# Patient Record
Sex: Female | Born: 2000 | Race: Black or African American | Hispanic: No | Marital: Single | Smoking: Never smoker
Health system: Southern US, Community
[De-identification: ages and names within clinical notes are randomized; demographics above are authoritative.]

## PROBLEM LIST (undated history)

## (undated) DIAGNOSIS — S83209A Unspecified tear of unspecified meniscus, current injury, unspecified knee, initial encounter: Secondary | ICD-10-CM

## (undated) HISTORY — PX: NO PAST SURGERIES: SHX2092

---
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
## 2021-01-31 ENCOUNTER — Other Ambulatory Visit: Payer: Self-pay

## 2021-01-31 ENCOUNTER — Encounter (HOSPITAL_BASED_OUTPATIENT_CLINIC_OR_DEPARTMENT_OTHER): Payer: Self-pay | Admitting: Orthopaedic Surgery

## 2021-02-03 ENCOUNTER — Other Ambulatory Visit (HOSPITAL_COMMUNITY)
Admission: RE | Admit: 2021-02-03 | Discharge: 2021-02-03 | Disposition: A | Discharge status: Home / Self Care | Payer: PRIVATE HEALTH INSURANCE | Source: Ambulatory Visit | Attending: Orthopaedic Surgery | Admitting: Orthopaedic Surgery

## 2021-02-03 DIAGNOSIS — Z20822 Contact with and (suspected) exposure to covid-19: Secondary | ICD-10-CM | POA: Insufficient documentation

## 2021-02-03 DIAGNOSIS — Z01812 Encounter for preprocedural laboratory examination: Secondary | ICD-10-CM | POA: Diagnosis present

## 2021-02-03 LAB — SARS CORONAVIRUS 2 (TAT 6-24 HRS): SARS Coronavirus 2: NEGATIVE

## 2021-02-04 ENCOUNTER — Other Ambulatory Visit (HOSPITAL_COMMUNITY): Payer: PRIVATE HEALTH INSURANCE

## 2021-02-04 NOTE — H&P (Signed)
PREOPERATIVE H&P  Chief Complaint: LEFT KNEE MEDIAL MENISCUS TEAR  HPI: Cheryl Cole is a 20 y.o. female who is scheduled for, Procedure(s): KNEE ARTHROSCOPY WITH MEDIAL MENISECTOMY; MEDIAL MENISCUS REPAIR KNEE ARTHROSCOPY WITH ANTERIOR CRUCIATE LIGAMENT (ACL) REPAIR.   Patient is a healthy 20 year old who had an injury in January when she was playing volleyball. She felt a pop in her knee. She has had swelling, popping and a feeling of instability. No other areas of injury. She is not playing any sports currently.  Her symptoms are rated as moderate to severe, and have been worsening.  This is significantly impairing activities of daily living.    Please see clinic note for further details on this patient's care.    She has elected for surgical management.   Past Medical History:  Diagnosis Date  . Torn meniscus    Past Surgical History:  Procedure Laterality Date  . NO PAST SURGERIES     Social History   Socioeconomic History  . Marital status: Single    Spouse name: Not on file  . Number of children: Not on file  . Years of education: Not on file  . Highest education level: Not on file  Occupational History  . Not on file  Tobacco Use  . Smoking status: Never Smoker  . Smokeless tobacco: Never Used  Vaping Use  . Vaping Use: Never used  Substance and Sexual Activity  . Alcohol use: Never  . Drug use: Never  . Sexual activity: Not on file  Other Topics Concern  . Not on file  Social History Narrative  . Not on file   Social Determinants of Health   Financial Resource Strain: Not on file  Food Insecurity: Not on file  Transportation Needs: Not on file  Physical Activity: Not on file  Stress: Not on file  Social Connections: Not on file   History reviewed. No pertinent family history. No Known Allergies Prior to Admission medications   Not on File    ROS: All other systems have been reviewed and were otherwise negative with the exception of  those mentioned in the HPI and as above.  Physical Exam: General: Alert, no acute distress Cardiovascular: No pedal edema Respiratory: No cyanosis, no use of accessory musculature GI: No organomegaly, abdomen is soft and non-tender Skin: No lesions in the area of chief complaint Neurologic: Sensation intact distally Psychiatric: Patient is competent for consent with normal mood and affect Lymphatic: No axillary or cervical lymphadenopathy  MUSCULOSKELETAL:  Left knee: Grossly unstable Lachman without endpoint.  Knee range of motion is negative from 15-130 degrees with a slight opening to a varus stress. External rotation appeared to be normal.   Imaging: MRI reviewed which demonstrated a full thickness ACL tear, complex tear of the medial meniscus and lateral meniscus.    Assessment: LEFT KNEE MEDIAL MENISCUS TEAR  Plan: Plan for Procedure(s): KNEE ARTHROSCOPY WITH MEDIAL MENISECTOMY; MEDIAL MENISCUS REPAIR KNEE ARTHROSCOPY WITH ANTERIOR CRUCIATE LIGAMENT (ACL) REPAIR  The risks benefits and alternatives were discussed with the patient including but not limited to the risks of nonoperative treatment, versus surgical intervention including infection, bleeding, nerve injury,  blood clots, cardiopulmonary complications, morbidity, mortality, among others, and they were willing to proceed.   The patient acknowledged the explanation, agreed to proceed with the plan and consent was signed.   Operative Plan: Left knee scope with BTB ACL and medial meniscectomy versus repair Discharge Medications: Standard DVT Prophylaxis: Aspirin Physical Therapy:  Outpatient  Special Discharge needs: Knee immobilizer ([employer] authorization has been submitted for authorization on 3/31)   Vernetta Honey, PA-C  02/04/2021 4:16 PM

## 2021-02-04 NOTE — Anesthesia Preprocedure Evaluation (Addendum)
Anesthesia Evaluation  Patient identified by MRN, date of birth, ID band Patient awake    Reviewed: Allergy & Precautions, NPO status , Patient's Chart, lab work & pertinent test results  Airway Mallampati: I  TM Distance: >3 FB Neck ROM: Full    Dental no notable dental hx.    Pulmonary neg pulmonary ROS,    Pulmonary exam normal breath sounds clear to auscultation       Cardiovascular negative cardio ROS Normal cardiovascular exam Rhythm:Regular Rate:Normal     Neuro/Psych negative neurological ROS  negative psych ROS   GI/Hepatic negative GI ROS, Neg liver ROS,   Endo/Other  negative endocrine ROS  Renal/GU negative Renal ROS     Musculoskeletal negative musculoskeletal ROS (+)   Abdominal   Peds  Hematology negative hematology ROS (+)   Anesthesia Other Findings LEFT KNEE MEDIAL MENISCUS TEAR  Reproductive/Obstetrics hcg negative                            Anesthesia Physical Anesthesia Plan  ASA: II  Anesthesia Plan: General and Regional   Post-op Pain Management: GA combined w/ Regional for post-op pain   Induction: Intravenous  PONV Risk Score and Plan: 3 and Ondansetron, Dexamethasone, Midazolam and Treatment may vary due to age or medical condition  Airway Management Planned: LMA  Additional Equipment:   Intra-op Plan:   Post-operative Plan: Extubation in OR  Informed Consent: I have reviewed the patients History and Physical, chart, labs and discussed the procedure including the risks, benefits and alternatives for the proposed anesthesia with the patient or authorized representative who has indicated his/her understanding and acceptance.     Dental advisory given  Plan Discussed with: CRNA  Anesthesia Plan Comments:        Anesthesia Quick Evaluation

## 2021-02-05 ENCOUNTER — Encounter (HOSPITAL_BASED_OUTPATIENT_CLINIC_OR_DEPARTMENT_OTHER): Payer: Self-pay | Admitting: Orthopaedic Surgery

## 2021-02-05 ENCOUNTER — Encounter (HOSPITAL_BASED_OUTPATIENT_CLINIC_OR_DEPARTMENT_OTHER)
Admission: RE | Disposition: A | Discharge status: Home / Self Care | Payer: Self-pay | Source: Home / Self Care | Attending: Orthopaedic Surgery

## 2021-02-05 ENCOUNTER — Other Ambulatory Visit: Payer: Self-pay

## 2021-02-05 ENCOUNTER — Ambulatory Visit (HOSPITAL_BASED_OUTPATIENT_CLINIC_OR_DEPARTMENT_OTHER): Payer: PRIVATE HEALTH INSURANCE | Admitting: Anesthesiology

## 2021-02-05 ENCOUNTER — Ambulatory Visit (HOSPITAL_BASED_OUTPATIENT_CLINIC_OR_DEPARTMENT_OTHER)
Admission: RE | Admit: 2021-02-05 | Discharge: 2021-02-05 | Disposition: A | Discharge status: Home / Self Care | Payer: PRIVATE HEALTH INSURANCE | Attending: Orthopaedic Surgery | Admitting: Orthopaedic Surgery

## 2021-02-05 DIAGNOSIS — S83242A Other tear of medial meniscus, current injury, left knee, initial encounter: Secondary | ICD-10-CM | POA: Diagnosis not present

## 2021-02-05 DIAGNOSIS — S83512A Sprain of anterior cruciate ligament of left knee, initial encounter: Secondary | ICD-10-CM | POA: Diagnosis not present

## 2021-02-05 DIAGNOSIS — X58XXXA Exposure to other specified factors, initial encounter: Secondary | ICD-10-CM | POA: Diagnosis not present

## 2021-02-05 DIAGNOSIS — S83282A Other tear of lateral meniscus, current injury, left knee, initial encounter: Secondary | ICD-10-CM | POA: Diagnosis not present

## 2021-02-05 HISTORY — DX: Unspecified tear of unspecified meniscus, current injury, unspecified knee, initial encounter: S83.209A

## 2021-02-05 HISTORY — PX: KNEE ARTHROSCOPY WITH LATERAL MENISECTOMY: SHX6193

## 2021-02-05 HISTORY — PX: KNEE ARTHROSCOPY WITH MEDIAL MENISECTOMY: SHX5651

## 2021-02-05 HISTORY — PX: KNEE ARTHROSCOPY WITH ANTERIOR CRUCIATE LIGAMENT (ACL) REPAIR: SHX5644

## 2021-02-05 LAB — POCT PREGNANCY, URINE: Preg Test, Ur: NEGATIVE

## 2021-02-05 SURGERY — ARTHROSCOPY, KNEE, WITH MEDIAL MENISCECTOMY
Anesthesia: Regional | Site: Knee | Laterality: Left

## 2021-02-05 MED ORDER — PHENYLEPHRINE 40 MCG/ML (10ML) SYRINGE FOR IV PUSH (FOR BLOOD PRESSURE SUPPORT)
PREFILLED_SYRINGE | INTRAVENOUS | Status: DC | PRN
  Administered 2021-02-05: 80 ug via INTRAVENOUS

## 2021-02-05 MED ORDER — METHOCARBAMOL 500 MG PO TABS: 500.0000 mg | ORAL_TABLET | Freq: Three times a day (TID) | ORAL | 0 refills | Status: DC | PRN

## 2021-02-05 MED ORDER — CEFAZOLIN SODIUM-DEXTROSE 2-4 GM/100ML-% IV SOLN
INTRAVENOUS | Status: AC
  Filled 2021-02-05: qty 100

## 2021-02-05 MED ORDER — VANCOMYCIN HCL 1000 MG IV SOLR
INTRAVENOUS | Status: DC | PRN
  Administered 2021-02-05: 1000 mg

## 2021-02-05 MED ORDER — KETOROLAC TROMETHAMINE 30 MG/ML IJ SOLN: 30.0000 mg | Freq: Once | INTRAMUSCULAR | Status: DC | PRN

## 2021-02-05 MED ORDER — DEXAMETHASONE SODIUM PHOSPHATE 10 MG/ML IJ SOLN
INTRAMUSCULAR | Status: DC | PRN
  Administered 2021-02-05: 5 mg via INTRAVENOUS

## 2021-02-05 MED ORDER — ROPIVACAINE HCL 5 MG/ML IJ SOLN
INTRAMUSCULAR | Status: DC | PRN
  Administered 2021-02-05: 30 mL via PERINEURAL

## 2021-02-05 MED ORDER — OXYCODONE HCL 5 MG/5ML PO SOLN: 5.0000 mg | Freq: Once | ORAL | Status: AC | PRN

## 2021-02-05 MED ORDER — ACETAMINOPHEN 500 MG PO TABS
1000.0000 mg | ORAL_TABLET | Freq: Once | ORAL | Status: AC
  Administered 2021-02-05: 1000 mg via ORAL

## 2021-02-05 MED ORDER — LACTATED RINGERS IV SOLN: INTRAVENOUS | Status: DC

## 2021-02-05 MED ORDER — ONDANSETRON HCL 4 MG/2ML IJ SOLN
INTRAMUSCULAR | Status: AC
  Filled 2021-02-05: qty 2

## 2021-02-05 MED ORDER — FENTANYL CITRATE (PF) 100 MCG/2ML IJ SOLN
25.0000 ug | INTRAMUSCULAR | Status: DC | PRN
  Administered 2021-02-05: 50 ug via INTRAVENOUS
  Administered 2021-02-05: 25 ug via INTRAVENOUS

## 2021-02-05 MED ORDER — EPINEPHRINE PF 1 MG/ML IJ SOLN
INTRAMUSCULAR | Status: AC
  Filled 2021-02-05: qty 8

## 2021-02-05 MED ORDER — FENTANYL CITRATE (PF) 100 MCG/2ML IJ SOLN
INTRAMUSCULAR | Status: AC
  Filled 2021-02-05: qty 2

## 2021-02-05 MED ORDER — AMISULPRIDE (ANTIEMETIC) 5 MG/2ML IV SOLN
10.0000 mg | Freq: Once | INTRAVENOUS | Status: DC | PRN
Start: 2021-02-05 — End: 2021-02-05

## 2021-02-05 MED ORDER — SODIUM CHLORIDE 0.9 % IR SOLN: Status: DC | PRN

## 2021-02-05 MED ORDER — ASPIRIN 81 MG PO CHEW: 81.0000 mg | CHEWABLE_TABLET | Freq: Two times a day (BID) | ORAL | 0 refills | Status: DC

## 2021-02-05 MED ORDER — ACETAMINOPHEN ER 650 MG PO TBCR: 650.0000 mg | EXTENDED_RELEASE_TABLET | Freq: Three times a day (TID) | ORAL | 0 refills | Status: DC | PRN

## 2021-02-05 MED ORDER — MIDAZOLAM HCL 2 MG/2ML IJ SOLN
2.0000 mg | Freq: Once | INTRAMUSCULAR | Status: AC
  Administered 2021-02-05: 2 mg via INTRAVENOUS

## 2021-02-05 MED ORDER — PROPOFOL 10 MG/ML IV BOLUS
INTRAVENOUS | Status: DC | PRN
  Administered 2021-02-05: 180 mg via INTRAVENOUS

## 2021-02-05 MED ORDER — ONDANSETRON HCL 4 MG PO TABS: 4.0000 mg | ORAL_TABLET | Freq: Three times a day (TID) | ORAL | 0 refills | Status: AC | PRN

## 2021-02-05 MED ORDER — OXYCODONE HCL 5 MG PO TABS: ORAL_TABLET | ORAL | 0 refills | Status: AC

## 2021-02-05 MED ORDER — ACETAMINOPHEN 500 MG PO TABS
ORAL_TABLET | ORAL | Status: AC
  Filled 2021-02-05: qty 2

## 2021-02-05 MED ORDER — IBUPROFEN 600 MG PO TABS: 600.0000 mg | ORAL_TABLET | Freq: Three times a day (TID) | ORAL | 0 refills | Status: DC

## 2021-02-05 MED ORDER — OXYCODONE HCL 5 MG PO TABS
5.0000 mg | ORAL_TABLET | Freq: Once | ORAL | Status: AC | PRN
  Administered 2021-02-05: 5 mg via ORAL

## 2021-02-05 MED ORDER — ONDANSETRON HCL 4 MG/2ML IJ SOLN
INTRAMUSCULAR | Status: DC | PRN
  Administered 2021-02-05: 4 mg via INTRAVENOUS

## 2021-02-05 MED ORDER — VANCOMYCIN HCL 1000 MG IV SOLR
INTRAVENOUS | Status: AC
  Filled 2021-02-05: qty 2000

## 2021-02-05 MED ORDER — OXYCODONE HCL 5 MG PO TABS
ORAL_TABLET | ORAL | Status: AC
  Filled 2021-02-05: qty 1

## 2021-02-05 MED ORDER — PHENYLEPHRINE 40 MCG/ML (10ML) SYRINGE FOR IV PUSH (FOR BLOOD PRESSURE SUPPORT)
PREFILLED_SYRINGE | INTRAVENOUS | Status: AC
  Filled 2021-02-05: qty 10

## 2021-02-05 MED ORDER — LIDOCAINE HCL (CARDIAC) PF 100 MG/5ML IV SOSY
PREFILLED_SYRINGE | INTRAVENOUS | Status: DC | PRN
  Administered 2021-02-05: 60 mg via INTRATRACHEAL

## 2021-02-05 MED ORDER — FENTANYL CITRATE (PF) 100 MCG/2ML IJ SOLN
INTRAMUSCULAR | Status: DC | PRN
  Administered 2021-02-05: 25 ug via INTRAVENOUS
  Administered 2021-02-05: 50 ug via INTRAVENOUS
  Administered 2021-02-05: 25 ug via INTRAVENOUS

## 2021-02-05 MED ORDER — MIDAZOLAM HCL 2 MG/2ML IJ SOLN
INTRAMUSCULAR | Status: AC
  Filled 2021-02-05: qty 2

## 2021-02-05 MED ORDER — PROMETHAZINE HCL 25 MG/ML IJ SOLN: 6.2500 mg | INTRAMUSCULAR | Status: DC | PRN

## 2021-02-05 MED ORDER — PROPOFOL 10 MG/ML IV BOLUS
INTRAVENOUS | Status: AC
  Filled 2021-02-05: qty 20

## 2021-02-05 MED ORDER — CEFAZOLIN SODIUM-DEXTROSE 2-4 GM/100ML-% IV SOLN
2.0000 g | INTRAVENOUS | Status: AC
  Administered 2021-02-05: 2 g via INTRAVENOUS

## 2021-02-05 SURGICAL SUPPLY — 85 items
ANCH SUT 2-0 CVD FBRSTCH PLSTR (Anchor) ×8 IMPLANT
APL PRP STRL LF DISP 70% ISPRP (MISCELLANEOUS) ×2
BANDAGE ESMARK 6X9 LF (GAUZE/BANDAGES/DRESSINGS) IMPLANT
BLADE AVERAGE 25X9 (BLADE) ×3 IMPLANT
BLADE CLIPPER SURG (BLADE) IMPLANT
BLADE SHAVER BONE 5.0X13 (MISCELLANEOUS) ×3 IMPLANT
BLADE SURG 10 STRL SS (BLADE) ×3 IMPLANT
BLADE SURG 15 STRL LF DISP TIS (BLADE) ×2 IMPLANT
BLADE SURG 15 STRL SS (BLADE) ×3
BNDG CMPR 9X6 STRL LF SNTH (GAUZE/BANDAGES/DRESSINGS)
BNDG ELASTIC 6X5.8 VLCR STR LF (GAUZE/BANDAGES/DRESSINGS) ×3 IMPLANT
BNDG ESMARK 6X9 LF (GAUZE/BANDAGES/DRESSINGS)
BONE TUNNEL PLUG CANNULATED (MISCELLANEOUS) ×3 IMPLANT
BURR OVAL 8 FLU 4.0X13 (MISCELLANEOUS) IMPLANT
CHLORAPREP W/TINT 26 (MISCELLANEOUS) ×3 IMPLANT
CLSR STERI-STRIP ANTIMIC 1/2X4 (GAUZE/BANDAGES/DRESSINGS) ×3 IMPLANT
COLLECTOR GRAFT TISSUE (SYSTAGENIX WOUND MANAGEMENT) ×3
COOLER ICEMAN CLASSIC (MISCELLANEOUS) ×3 IMPLANT
COVER BACK TABLE 60X90IN (DRAPES) ×3 IMPLANT
COVER WAND RF STERILE (DRAPES) IMPLANT
CUFF TOURN SGL QUICK 34 (TOURNIQUET CUFF) ×3
CUFF TRNQT CYL 34X4.125X (TOURNIQUET CUFF) ×2 IMPLANT
CUTTER TENSIONER SUT 2-0 0 FBW (INSTRUMENTS) ×3 IMPLANT
DECANTER SPIKE VIAL GLASS SM (MISCELLANEOUS) IMPLANT
DISSECTOR 3.5MM X 13CM CVD (MISCELLANEOUS) IMPLANT
DISSECTOR 4.0MMX13CM CVD (MISCELLANEOUS) ×3 IMPLANT
DRAPE ARTHROSCOPY W/POUCH 90 (DRAPES) ×3 IMPLANT
DRAPE IMP U-DRAPE 54X76 (DRAPES) ×3 IMPLANT
DRAPE TOP ARMCOVERS (MISCELLANEOUS) ×3 IMPLANT
DRAPE U-SHAPE 47X51 STRL (DRAPES) ×3 IMPLANT
ELECT REM PT RETURN 9FT ADLT (ELECTROSURGICAL) ×3
ELECTRODE REM PT RTRN 9FT ADLT (ELECTROSURGICAL) ×2 IMPLANT
GAUZE SPONGE 4X4 12PLY STRL (GAUZE/BANDAGES/DRESSINGS) ×6 IMPLANT
GLOVE SRG 8 PF TXTR STRL LF DI (GLOVE) ×2 IMPLANT
GLOVE SURG ENC MOIS LTX SZ6.5 (GLOVE) ×6 IMPLANT
GLOVE SURG LTX SZ8 (GLOVE) ×6 IMPLANT
GLOVE SURG UNDER POLY LF SZ6.5 (GLOVE) ×3 IMPLANT
GLOVE SURG UNDER POLY LF SZ7 (GLOVE) ×3 IMPLANT
GLOVE SURG UNDER POLY LF SZ8 (GLOVE) ×3
GOWN STRL REUS W/ TWL LRG LVL3 (GOWN DISPOSABLE) ×4 IMPLANT
GOWN STRL REUS W/TWL LRG LVL3 (GOWN DISPOSABLE) ×6
GOWN STRL REUS W/TWL XL LVL3 (GOWN DISPOSABLE) ×3 IMPLANT
GUIDEPIN FLEX PATHFINDER 2.4MM (WIRE) IMPLANT
IMMOBILIZER KNEE 22 UNIV (SOFTGOODS) IMPLANT
IMMOBILIZER KNEE 24 THIGH 36 (MISCELLANEOUS) IMPLANT
IMMOBILIZER KNEE 24 UNIV (MISCELLANEOUS)
IMPL FIBERSTICH 2-0 CVD (Anchor) ×8 IMPLANT
IMPLANT FIBERSTICH 2-0 CVD (Anchor) ×12 IMPLANT
IV NS IRRIG 3000ML ARTHROMATIC (IV SOLUTION) ×12 IMPLANT
KIT TRANSTIBIAL (DISPOSABLE) ×3 IMPLANT
KIT TURNOVER KIT B (KITS) ×3 IMPLANT
KNIFE GRAFT ACL 10MM 5952 (MISCELLANEOUS) ×3 IMPLANT
KNIFE GRAFT ACL 9MM (MISCELLANEOUS) IMPLANT
MANIFOLD NEPTUNE II (INSTRUMENTS) ×3 IMPLANT
NDL SAFETY ECLIPSE 18X1.5 (NEEDLE) ×2 IMPLANT
NEEDLE HYPO 18GX1.5 SHARP (NEEDLE) ×3
NS IRRIG 1000ML POUR BTL (IV SOLUTION) ×3 IMPLANT
PACK ARTHROSCOPY DSU (CUSTOM PROCEDURE TRAY) ×3 IMPLANT
PACK BASIN DAY SURGERY FS (CUSTOM PROCEDURE TRAY) ×3 IMPLANT
PAD COLD SHLDR UNI XL WRAP-ON (PAD) ×3
PAD COLD SHLDR WRAP-ON (PAD) ×3 IMPLANT
PAD COLD UNI XL WRAP-ON (PAD) ×2 IMPLANT
PENCIL SMOKE EVACUATOR (MISCELLANEOUS) IMPLANT
PORT APPOLLO RF 90DEGREE MULTI (SURGICAL WAND) IMPLANT
PORTAL SKID DEVICE (INSTRUMENTS) ×3 IMPLANT
SCREW SHEATHED INTERF 7X25 (Screw) ×3 IMPLANT
SCREW SHEATHED INTERF 9X20MM (Screw) ×3 IMPLANT
SLEEVE SCD COMPRESS KNEE MED (STOCKING) ×3 IMPLANT
SPONGE LAP 4X18 RFD (DISPOSABLE) ×3 IMPLANT
SUT FIBERWIRE #2 38 T-5 BLUE (SUTURE) ×18
SUT MNCRL AB 4-0 PS2 18 (SUTURE) ×3 IMPLANT
SUT VIC AB 0 CT1 27 (SUTURE) ×3
SUT VIC AB 0 CT1 27XBRD ANBCTR (SUTURE) ×2 IMPLANT
SUT VIC AB 3-0 SH 27 (SUTURE) ×3
SUT VIC AB 3-0 SH 27X BRD (SUTURE) ×2 IMPLANT
SUTURE FIBERWR #2 38 T-5 BLUE (SUTURE) ×12 IMPLANT
SYR 5ML LL (SYRINGE) ×3 IMPLANT
SYR 5ML LUER SLIP (SYRINGE) ×3 IMPLANT
TISSUE GRAFT COLLECTOR (SYSTAGENIX WOUND MANAGEMENT) ×2 IMPLANT
TOWEL GREEN STERILE FF (TOWEL DISPOSABLE) ×6 IMPLANT
TUBE CONNECTING 20X1/4 (TUBING) ×3 IMPLANT
TUBE SUCTION HIGH CAP CLEAR NV (SUCTIONS) ×3 IMPLANT
TUBING ARTHROSCOPY IRRIG 16FT (MISCELLANEOUS) ×3 IMPLANT
WATER STERILE IRR 1000ML POUR (IV SOLUTION) ×3 IMPLANT
WRAP KNEE MAXI GEL POST OP (GAUZE/BANDAGES/DRESSINGS) IMPLANT

## 2021-02-05 NOTE — Op Note (Signed)
Orthopaedic Surgery Operative Note (CSN: [id number])  Cheryl Cole  [birth year] Date of Surgery: 02/05/2021   Diagnoses:  Left knee ACL rupture, medial and lateral meniscus tears  Procedure: Left autograft BTB ACL reconstruction Left medial meniscus repair all inside Left partial lateral meniscectomy   Operative Finding Exam under anesthesia: Grossly unstable Lachman with a pivot shift, patient had 15 degrees of hyperextension and full flexion.  No medial or lateral type instability. Suprapatellar pouch: Normal Patellofemoral Compartment: Normal Medial Compartment: Posterior peripheral meniscus tear in the red red zone, repaired with 2 Arthrex fiber stitch anchors Lateral Compartment: Complex posterior lateral meniscus tear nearing the popliteal hiatus which was in the white white zone.  Debrided back with a shaver and a basket, 30% total meniscal volume resected. Intercondylar Notch: Complete ACL tear that appeared relatively chronic in nature.  Empty wall sign noted.  Successful completion of the planned procedure.  Patient's knee was very unstable on her hypermobility worries [place] for retear however we had a robust repair with a BTB autograft.  10 mm graft use.  Her meniscus repair medially was robust as well and has a good chance of healing.  Her lateral meniscus unfortunately was not amenable to repair.  Post-operative plan: The patient will be weightbearing to tolerance in a knee immobilizer transition to a hinged brace.  The patient will be discharged home.  DVT prophylaxis Aspirin 81 mg twice daily for 6 weeks.  Pain control with PRN pain medication preferring oral medicines.  Follow up plan will be scheduled in approximately 7 days for incision check and XR.  Therapy will start as soon as possible though the patient must participate at a [facility] facility due to her insurance.  Post-Op Diagnosis: Same Surgeons:Primary: Bjorn Pippin, MD Assistants:Caroline McBane PA-C Location:  [facility] OR ROOM 6 Anesthesia: General with adductor canal block Antibiotics: Ancef 2 g with local vancomycin powder 1 g at the surgical site Tourniquet time:  Total Tourniquet Time Documented: Thigh (Left) - 68 minutes Total: Thigh (Left) - 68 minutes  Estimated Blood Loss: Minimal Complications: None Specimens: None Implants: Implant Name Type Inv. Item Serial No. Manufacturer Lot No. LRB No. Used Action  IMPLANT FIBERSTICH 2-0 CVD - [id number] Anchor IMPLANT FIBERSTICH 2-0 CVD  [employer] [id number] Left 1 Implanted  IMPLANT FIBERSTICH 2-0 CVD - [id number] Anchor IMPLANT FIBERSTICH 2-0 CVD  [employer] [id number] Left 1 Implanted  SCREW SHEATHED INTERF 9X20MM - [id number] Screw SCREW SHEATHED INTERF 9X20MM  [employer] [id number] Left 1 Implanted  SCREW SHEATHED INTERF 7X25MM - [id number] Screw SCREW SHEATHED INTERF 7X25MM  [employer] [id number] Left 1 Implanted    Indications for Surgery:   Cheryl Cole is a 20 y.o. female with multiple injuries resulting in a left ACL tear and medial lateral meniscus tears.  Benefits and risks of operative and nonoperative management were discussed prior to surgery with patient/guardian(s) and informed consent form was completed.  Specific risks including infection, need for additional surgery, retear, stiffness, meniscus retear and post meniscectomy syndrome amongst others   Procedure:   The patient was identified properly. Informed consent was obtained and the surgical site was marked. The patient was taken up to suite where general anesthesia was induced. The patient was placed in the supine position with a post against the surgical leg and a nonsterile tourniquet applied. The surgical leg was then prepped and draped usual sterile fashion.  A standard surgical timeout was performed.    We began by making an incision  along the medial third of the patellar tendon in line with the tendon itself starting at the level of the distal pole of the patella ending 3 cm distal  to the insertion on the tubercle. We carried the incision down sharply achieving hemostasis 3 progressed identifying the tissue plane of the peritenon. The created skin flaps medially and laterally taking care to avoid damage to the superficial skin. This point the peritenon was incised sharply in line with the tendon and again flaps are created exposing the medial and lateral borders of the tendon. We then took care to ensure that there is appropriate visualization for forearm harvest within our incision using a mobile window technique.  We then used a double bladed scalpel incised the tendon longitudinally 10 mm wide. This incision within the tendon was carried proximal and distally onto the tubercle and proximal wound patella to create a 25 mm bone block from patella and a 27 mm bone block from the tibia.  We made our longitudinal cuts with a saw taking care to not go deeper than 10 mm and rather than make a transverse patella cut we made a bullet style tapered cut at the proximal aspect of the patella to avoid the risk for transverse patella fracture.  The harvest went without issue and graft was taken to the back table.    This point we closed the defect in the patellar tendon after identifying that there was appropriate medial and lateral tendon still intact.   We began arthroscopy and made our lateral and medial portals within our incision on each side of the tendon. Fat pad was resected and diagnostic arthroscopy performed with the findings listed above.   Identified the medial meniscus amenable to repair and repaired with a rasp prior to placing 2 Arthrex fiber stitch anchors.  We placed 2 additional anchors however they did not appropriately fire and the anchors were removed.  Good good robust fixation of our meniscus.  Lateral meniscus was not amenable to repair and was debrided back with a basket and shaver back to stable base.  30% total meniscal volume resected.  The anterior cruciate  ligament stump was debrided utilizing a shaver taking great care to preserve the remnant stump on the femur and the tibia for localization of our tunnels. Once the remnant anterior cruciate ligament was removed and we obtained appropriate visualization by performing a small notchplasty and confirmed that we had indeed identify the over-the-top position. We made small marks at the location of the aperture of the tibial and femoral tunnels and double checked our location prior to drilling.  We began with a femoral tunnel.  We had taken care to make a far medial and low anteromedial portal with spinal needle localization to ensure that we can get appropriate position on the lateral wall of the notch from an anterior medial portal drilling technique.  We used a 7 mm offset guide with the knee at 90 degrees of flexion to mark in the position of the old ACL stump.  We then switched our camera to the medial portal and checked that our position was appropriate compared to the back wall.  At that point we hyperflexed the knee and used the 7 mm offset guide again primarily as a sheath to freehand place our wire at the aforementioned mark taking care to exit anterior to the mid femoral line to avoid posterior wall blowout.  Once the wire was advanced through the skin we clamped it and then placed a  10 mm acorn style reamer by hand ensuring that we did not interfere with the medial femoral condyle.  Once it entered the notch we are able to connect it to a reamer and ream to 35 mm of tunnel depth.  We used an Arthrex GraftNet device to harvest with a shaver any of the bony debris and cleared bony debris from the tunnel to ensure that we had an easy pass.  We again checked from the medial portal to ensure that we only had a 2 mm posterior wall and appropriate position of our tunnel at the 2:30 position.  At this point we advanced our Beath pin and shuttled a #2 FiberWire for eventual graft passage.  We turned our  attention to the tibial tunnel.  We cleared the old ACL stump soft tissue.  We then were able to use a Arthrex tipped aiming tibial guide set to 55 degrees to place our tibial tunnel ensuring that we were centered using the landmark of the posterior aspect of the anterior horn of the lateral meniscus as well as the previous stump.  We took great care to ensure that our wire was positioned appropriately.  We then reamed with the barrel reamer through our ACL harvest incision taking care to protect the skin.  We harvested bone as we reamed and then completed the reaming into the joint.  Any soft tissue was cleared from the apertures of the tunnel.  We finally checked our tunnel position and apertures once more and were happy were these and proceeded with graft shuttling.  The graft was shuttled in typical fashion and we were able to visualize entering the femoral tunnel.  We ensured that the cancellous surface of the graft was anterior moving the collagen of the graft as far posterior as possible.  Before advancing the graft into the femoral socket we placed a guidewire for screw fixation.  The graft was then advanced the appropriate depth and we hyperflexed the knee for placement of our screw.  Femoral fixation was with a 7 x 25 mm metal Arthrex screw. We obtained good purchase with the screw. We verified arthroscopically that there is no sign of graft impingement on the notch. We then cycled the knee multiple times and turned our attention to the tibia.  Tibia was fixed with a 9 x 20 mm metal Arthrex screw and the graft was extremely rotated 90 to anteriorize the tendinous portion within the joint. We achieved good purchase of the graft and there was minimal mismatch.  At this point a gentle Lachman maneuver was performed and there is a stable endpoint and little translation.  Autograft harvested from left over graft prep as well as reamings was used to bone graft the patella as well as the tibial  defects the peritenon was closed.  Incision was closed in multilayer fashion with absorbable suture and Steri-Strips placed. Sterile dressing and knee brace were placed and patient taken to PACU without adverse event.   Alfonse Alpers, PA-C, present and scrubbed throughout the case, critical for completion in a timely fashion, and for retraction, instrumentation, closure.

## 2021-02-05 NOTE — Transfer of Care (Signed)
Immediate Anesthesia Transfer of Care Note  Patient: Cheryl Cole. Se[place]  Procedure(s) Performed: KNEE ARTHROSCOPY WITH  MEDIAL MENISCUS REPAIR (Left Knee) KNEE ARTHROSCOPY WITH ANTERIOR CRUCIATE LIGAMENT (ACL) REPAIR WITH AUTOGRAFT (Left Knee) KNEE ARTHROSCOPY WITH LATERAL MENISECTOMY (Knee)  Patient Location: PACU  Anesthesia Type:General  Level of Consciousness: drowsy, patient cooperative and responds to stimulation  Airway & Oxygen Therapy: Patient Spontanous Breathing and Patient connected to face mask oxygen  Post-op Assessment: Report given to RN and Post -op Vital signs reviewed and stable  Post vital signs: Reviewed and stable  Last Vitals:  Vitals Value Taken Time  BP    Temp    Pulse 78 02/05/21 1151  Resp 17 02/05/21 1151  SpO2 100 % 02/05/21 1151  Vitals shown include unvalidated device data.  Last Pain:  Vitals:   02/05/21 0826  TempSrc: Oral  PainSc: 0-No pain         Complications: No complications documented.

## 2021-02-05 NOTE — Discharge Instructions (Signed)
Ramond Marrow MD, MPH Alfonse Alpers, PA-C [facility] [address], [address] [phone] (tel)   [phone] (fax)   POST-OPERATIVE INSTRUCTIONS - ACL RECONSTRUCTION  WOUND CARE . You may remove the Operative Dressing on Post-Op Day #3 (72hrs after surgery).   . Leave steri strips in place.   . If you feel more comfortable with it you can leave all dressings in place till your 1 week follow-up appointment.   Marland Kitchen KEEP THE INCISIONS CLEAN AND DRY. Marland Kitchen An ACE wrap may be used to control swelling, do not wrap this too tight.  If the initial ACE wrap feels too tight or constricting you may loosen it. . There may be a small amount of fluid/bleeding leaking at the surgical site.  . This is normal; the knee is filled with fluid during the procedure and can leak for 24-48hrs after surgery.  . You may change/reinforce the bandage as needed.  . Use the Cryocuff, GameReady or Ice as often as possible for the first 3-4 days, then as needed for pain relief. Always keep a towel, ACE wrap or other barrier between the cooling unit and your skin.  . You may shower on Post-Op Day #3. Gently pat the area dry. Do not soak the knee in water.  . Do not go swimming in the pool or ocean until 4 weeks after surgery or when otherwise instructed.  BRACE/AMBULATION . Your leg will be placed in a brace post-operatively.  . You will need to wear your brace at all times until we discuss it further. (including when you sleep!) . You will be instructed on further bracing after your first visit. . Use crutches for comfort but you can put your full weight on the leg as tolerated.  Physical therapy: - A referral has been sent to [facility] in [place] - If you have not been called to set up an appointment, please contact their office to get set up - [facility] at [facility] - If you have any issues, please contact our  office to let us know - Therapy will be very important for your recover after surgery    REGIONAL ANESTHESIA (NERVE BLOCKS) - The anesthesia team may have performed a nerve block for you if safe in the setting of your care.  This is a great tool used to minimize pain.  Typically the block may start wearing off overnight.  This can be a challenging period but please utilize your as needed pain medications to try and manage this period and know it will be a brief transition as the nerve block wears completely   POST-OP MEDICATIONS- Multimodal approach to pain control . In general your pain will be controlled with a combination of substances.  Prescriptions unless otherwise discussed are electronically sent to your pharmacy.  This is a carefully made plan we use to minimize narcotic use.     ? Ibuprofen - Anti-inflammatory medication taken on a scheduled basis - Take 1 (600mg ) tablet every 8 hours ? Acetaminophen - Non-narcotic pain medicine taken on a scheduled basis  - Take 1 (650mg ) tablet every 8 hours ? Robaxin - this is a muscle relaxer, take as needed for muscle spasms ? Oxycodone - This is a strong narcotic, to be used only on an "as needed" basis for pain. ? Aspirin 81mg  - This medicine is used to minimize the risk of blood clots after surgery. ?  Zofran -  take as needed for nausea ?   FOLLOW-UP . Please call the office to schedule a follow-up appointment for your incision check if you do not already have one, 7-10 days post-operatively. . IF YOU HAVE ANY QUESTIONS, PLEASE FEEL FREE TO CALL OUR OFFICE.  HELPFUL INFORMATION  . If you had a block, it will wear off between 8-24 hrs postop typically.  This is period when your pain may go from nearly zero to the pain you would have had post-op without the block.  This is an abrupt transition but nothing dangerous is happening.  You may take an extra dose of narcotic when this happens.  Marland Kitchen Keep your leg elevated to decrease swelling, which  will then in turn decrease your pain. I would elevate the foot of your bed by putting a couple of couch pillows between your mattress and box spring. I would not keep pillow directly under your ankle.  . You must wear the brace locked while sleeping and ambulating until follow-up.   . There will be MORE swelling on days 1-3 than there is on the day of surgery.  This also is normal. The swelling will decrease with the anti-inflammatory medication, ice and keeping it elevated. The swelling will make it more difficult to bend your knee. As the swelling goes down your motion will become easier  . You may develop swelling and bruising that extends from your knee down to your calf and perhaps even to your foot over the next week. Do not be alarmed. This too is normal, and it is due to gravity  . There may be some numbness adjacent to the incision site. This may last for 6-12 months or longer in some patients and is expected.  . You may return to sedentary work/school in the next couple of days when you feel up to it. You will need to keep your leg elevated as much as possible   . You should wean off your narcotic medicines as soon as you are able.  Most patients will be off or using minimal narcotics before their first postop appointment.   . We suggest you use the pain medication the first night prior to going to bed, in order to ease any pain when the anesthesia wears off. You should avoid taking pain medications on an empty stomach as it will make you nauseous.  . Do not drink alcoholic beverages or take illicit drugs when taking pain medications.  . It is against the law to drive while taking narcotics. You cannot drive if your Right leg is in brace locked in extension.  . Pain medication may make you constipated.  Below are a few solutions to try in this order: o Decrease the amount of pain medication if you aren't having pain. o Drink lots of decaffeinated fluids. o Drink prune juice and/or  each dried prunes  o If the first 3 don't work start with additional solutions o Take Colace - an over-the-counter stool softener o Take Senokot - an over-the-counter laxative o Take Miralax - a stronger over-the-counter laxative  For more information including helpful videos and documents visit our website:   [email or web address]     Post Anesthesia Home Care Instructions  Activity: Get plenty of rest for the remainder of the day. A responsible individual must stay with you for 24 hours following the procedure.  For the next 24 hours, DO NOT: -Drive a car -Advertising copywriter -Drink alcoholic beverages -Take any medication unless instructed by your physician -  Make any legal decisions or sign important papers.  Meals: Start with liquid foods such as gelatin or soup. Progress to regular foods as tolerated. Avoid greasy, spicy, heavy foods. If nausea and/or vomiting occur, drink only clear liquids until the nausea and/or vomiting subsides. Call your physician if vomiting continues.  Special Instructions/Symptoms: Your throat may feel dry or sore from the anesthesia or the breathing tube placed in your throat during surgery. If this causes discomfort, gargle with warm salt water. The discomfort should disappear within 24 hours.  If you had a scopolamine patch placed behind your ear for the management of post- operative nausea and/or vomiting:  1. The medication in the patch is effective for 72 hours, after which it should be removed.  Wrap patch in a tissue and discard in the trash. Wash hands thoroughly with soap and water. 2. You may remove the patch earlier than 72 hours if you experience unpleasant side effects which may include dry mouth, dizziness or visual disturbances. 3. Avoid touching the patch. Wash your hands with soap and water after contact with the patch.    No Tylenol until 2:30 pm  Regional Anesthesia Blocks  1. Numbness or the  inability to move the "blocked" extremity may last from 3-48 hours after placement. The length of time depends on the medication injected and your individual response to the medication. If the numbness is not going away after 48 hours, call your surgeon.  2. The extremity that is blocked will need to be protected until the numbness is gone and the  Strength has returned. Because you cannot feel it, you will need to take extra care to avoid injury. Because it may be weak, you may have difficulty moving it or using it. You may not know what position it is in without looking at it while the block is in effect.  3. For blocks in the legs and feet, returning to weight bearing and walking needs to be done carefully. You will need to wait until the numbness is entirely gone and the strength has returned. You should be able to move your leg and foot normally before you try and bear weight or walk. You will need someone to be with you when you first try to ensure you do not fall and possibly risk injury.  4. Bruising and tenderness at the needle site are common side effects and will resolve in a few days.  5. Persistent numbness or new problems with movement should be communicated to the surgeon or the [facility] [phone] [facility] [phone]).

## 2021-02-05 NOTE — Anesthesia Procedure Notes (Signed)
Anesthesia Regional Block: Adductor canal block   Pre-Anesthetic Checklist: ,, timeout performed, Correct Patient, Correct Site, Correct Laterality, Correct Procedure,, site marked, risks and benefits discussed, Surgical consent,  Pre-op evaluation,  At surgeon's request and post-op pain management  Laterality: Left  Prep: chloraprep       Needles:  Injection technique: Single-shot  Needle Type: Echogenic Stimulator Needle     Needle Length: 10cm  Needle Gauge: 20     Additional Needles:   Procedures:,,,, ultrasound used (permanent image in chart),,,,  Narrative:  Start time: 02/05/2021 9:00 AM End time: 02/05/2021 9:10 AM Injection made incrementally with aspirations every 5 mL.  Performed by: Personally  Anesthesiologist: Leonides Grills, MD  Additional Notes: Functioning IV was confirmed and monitors were applied. A time-out was performed. Hand hygiene and sterile gloves were used. The thigh was placed in a frog-leg position and prepped in a sterile fashion. A 20ga BBraun echogenic stimulator needle was placed using ultrasound guidance.  Negative aspiration and negative test dose prior to incremental administration of local anesthetic. The patient tolerated the procedure well.

## 2021-02-05 NOTE — Progress Notes (Signed)
Assisted Dr. Ellender with left, ultrasound guided, adductor canal block. Side rails up, monitors on throughout procedure. See vital signs in flow sheet. Tolerated Procedure well.  

## 2021-02-05 NOTE — Anesthesia Procedure Notes (Signed)
Procedure Name: LMA Insertion Date/Time: 02/05/2021 10:23 AM Performed by: Thornell Mule, CRNA Pre-anesthesia Checklist: Emergency Drugs available, Patient identified, Suction available and Patient being monitored Patient Re-evaluated:Patient Re-evaluated prior to induction Oxygen Delivery Method: Circle system utilized Preoxygenation: Pre-oxygenation with 100% oxygen Induction Type: IV induction LMA: LMA inserted LMA Size: 4.0 Number of attempts: 1 Placement Confirmation: positive ETCO2 Tube secured with: Tape Dental Injury: Teeth and Oropharynx as per pre-operative assessment

## 2021-02-05 NOTE — Anesthesia Postprocedure Evaluation (Signed)
Anesthesia Post Note  Patient: Cheryl Cole  Procedure(s) Performed: KNEE ARTHROSCOPY WITH  MEDIAL MENISCUS REPAIR (Left Knee) KNEE ARTHROSCOPY WITH ANTERIOR CRUCIATE LIGAMENT (ACL) REPAIR WITH AUTOGRAFT (Left Knee) KNEE ARTHROSCOPY WITH LATERAL MENISECTOMY (Knee)     Patient location during evaluation: PACU Anesthesia Type: Regional and General Level of consciousness: awake Pain management: pain level controlled Vital Signs Assessment: post-procedure vital signs reviewed and stable Respiratory status: spontaneous breathing, nonlabored ventilation, respiratory function stable and patient connected to nasal cannula oxygen Cardiovascular status: blood pressure returned to baseline and stable Postop Assessment: no apparent nausea or vomiting Anesthetic complications: no   No complications documented.  Last Vitals:  Vitals:   02/05/21 1230 02/05/21 1313  BP: 119/87 137/86  Pulse: 85 79  Resp: 17 16  Temp:  37.3 C  SpO2: 100% 100%    Last Pain:  Vitals:   02/05/21 1313  TempSrc:   PainSc: 5                  Anvika Gashi P Shooter Tangen

## 2021-02-05 NOTE — Interval H&P Note (Signed)
History and Physical Interval Note:  02/05/2021 10:00 AM  Cheryl Cole  has presented today for surgery, with the diagnosis of LEFT KNEE MEDIAL MENISCUS TEAR.  The various methods of treatment have been discussed with the patient and family. After consideration of risks, benefits and other options for treatment, the patient has consented to  Procedure(s): KNEE ARTHROSCOPY WITH MEDIAL MENISECTOMY; MEDIAL MENISCUS REPAIR (Left) KNEE ARTHROSCOPY WITH ANTERIOR CRUCIATE LIGAMENT (ACL) REPAIR (Left) as a surgical intervention.  The patient's history has been reviewed, patient examined, no change in status, stable for surgery.  I have reviewed the patient's chart and labs.  Questions were answered to the patient's satisfaction.     Bjorn Pippin

## 2021-02-06 ENCOUNTER — Encounter (HOSPITAL_BASED_OUTPATIENT_CLINIC_OR_DEPARTMENT_OTHER): Payer: Self-pay | Admitting: Orthopaedic Surgery

## 2021-02-07 ENCOUNTER — Other Ambulatory Visit: Payer: Self-pay

## 2021-02-07 ENCOUNTER — Ambulatory Visit: Payer: PRIVATE HEALTH INSURANCE | Attending: Orthopaedic Surgery | Admitting: Physical Therapy

## 2021-02-07 ENCOUNTER — Encounter: Payer: Self-pay | Admitting: Physical Therapy

## 2021-02-07 DIAGNOSIS — G8929 Other chronic pain: Secondary | ICD-10-CM | POA: Diagnosis present

## 2021-02-07 DIAGNOSIS — R2689 Other abnormalities of gait and mobility: Secondary | ICD-10-CM

## 2021-02-07 DIAGNOSIS — S83512D Sprain of anterior cruciate ligament of left knee, subsequent encounter: Secondary | ICD-10-CM | POA: Insufficient documentation

## 2021-02-07 DIAGNOSIS — M25562 Pain in left knee: Secondary | ICD-10-CM | POA: Insufficient documentation

## 2021-02-07 DIAGNOSIS — M25662 Stiffness of left knee, not elsewhere classified: Secondary | ICD-10-CM | POA: Diagnosis present

## 2021-02-07 NOTE — Therapy (Signed)
[facility] [address] [place], [place], [address] Phone: [phone]   Fax:  [phone]  Physical Therapy Evaluation  Patient Details  Name: Cheryl Cole MRN: [id number] Date of Birth: 2001/06/21 Referring Provider (PT): DR Ramond Marrow   Encounter Date: 02/07/2021   PT End of Session - 02/07/21 1033    Visit Number 1    Number of Visits 16    Date for PT Re-Evaluation 03/21/21    PT Start Time 0800    PT Stop Time 0845    PT Time Calculation (min) 45 min    Activity Tolerance Patient tolerated treatment well    Behavior During Therapy [facility] for tasks assessed/performed           Past Medical History:  Diagnosis Date  . Torn meniscus     Past Surgical History:  Procedure Laterality Date  . KNEE ARTHROSCOPY WITH ANTERIOR CRUCIATE LIGAMENT (ACL) REPAIR Left 02/05/2021   Procedure: KNEE ARTHROSCOPY WITH ANTERIOR CRUCIATE LIGAMENT (ACL) REPAIR WITH AUTOGRAFT;  Surgeon: Bjorn Pippin, MD;  Location: [facility];  Service: Orthopedics;  Laterality: Left;  . KNEE ARTHROSCOPY WITH LATERAL MENISECTOMY  02/05/2021   Procedure: KNEE ARTHROSCOPY WITH LATERAL MENISECTOMY;  Surgeon: Bjorn Pippin, MD;  Location: [facility];  Service: Orthopedics;;  . KNEE ARTHROSCOPY WITH MEDIAL MENISECTOMY Left 02/05/2021   Procedure: KNEE ARTHROSCOPY WITH  MEDIAL MENISCUS REPAIR;  Surgeon: Bjorn Pippin, MD;  Location: [facility];  Service: Orthopedics;  Laterality: Left;  . NO PAST SURGERIES      There were no vitals filed for this visit.    Subjective Assessment - 02/07/21 0808    Subjective Patient had an ACL BTB graft with medial meniscal repair on 02/05/2021. She has mangeable pain at this time.    Currently in Pain? Yes    Pain Score 5     Pain Location Knee    Pain Orientation Left    Pain Descriptors / Indicators Aching    Pain Type Chronic pain    Pain Onset More than a month ago    Pain  Frequency Constant    Aggravating Factors  just aches    Pain Relieving Factors ice and medication    Multiple Pain Sites No              [facility] PT Assessment - 02/07/21 0001      Assessment   Medical Diagnosis L ACL with BTB graft and Medial meniscal reapair    Referring Provider (PT) DR Ramond Marrow    Onset Date/Surgical Date 02/05/21    Hand Dominance Right    Next MD Visit 02/13/2021    Prior Therapy None      Precautions   Precautions Other (comment)    Precaution Comments ACL    Required Braces or Orthoses Other Brace/Splint      Restrictions   Weight Bearing Restrictions Yes    LLE Weight Bearing Weight bearing as tolerated      Balance Screen   Has the patient fallen in the past 6 months No    Has the patient had a decrease in activity level because of a fear of falling?  No    Is the patient reluctant to leave their home because of a fear of falling?  No      Home Environment   Additional Comments 1 step in and several steps to her bedroom      Prior  Function   Level of Independence Independent    Warden/ranger    Leisure Plays Volleyball      Cognition   Overall Cognitive Status Within Functional Limits for tasks assessed    Attention Focused    Focused Attention Appears intact    Memory Appears intact    Awareness Appears intact    Problem Solving Appears intact      Observation/Other Assessments   Focus on Therapeutic Outcomes (FOTO)  Medicaid      Sensation   Light Touch Appears Intact    Additional Comments denies parathesias      Coordination   Gross Motor Movements are Fluid and Coordinated Yes    Fine Motor Movements are Fluid and Coordinated Yes      ROM / Strength   AROM / PROM / Strength AROM;PROM;Strength      AROM   Overall AROM Comments not assesed 2nd to recent surgery    AROM Assessment Site Knee      PROM   PROM Assessment Site Knee    Right/Left Knee Left    Left Knee Extension -3    Left Knee Flexion 58       Strength   Overall Strength Comments not assessed 2nd to recent surgery      Ambulation/Gait   Gait Comments Patient comes in non-weight bearing on the right on 2 crutches.                      Objective measurements completed on examination: See above findings.       [facility] Adult PT Treatment/Exercise - 02/07/21 0001      Knee/Hip Exercises: Stretches   Other Knee/Hip Stretches reviewed self knee extension stretch and knee flexion stretch with cuing on progression and symptom mangement      Knee/Hip Exercises: Standing   Other Standing Knee Exercises weight shifting forward and side to side      Knee/Hip Exercises: Supine   Quad Sets Limitations x10 5 sec hold                    PT Short Term Goals - 02/07/21 1046      PT SHORT TERM GOAL #1   Title Patient will increase knee flexion to 90 degrees    Baseline 58    Time 4    Period Weeks    Status New    Target Date 03/07/21      PT SHORT TERM GOAL #2   Title Patient will demonstrate full extension    Baseline -3    Time 4    Period Weeks    Status New    Target Date 03/07/21      PT SHORT TERM GOAL #3   Title Patient will ambualte 300' without a device    Time 4    Period Weeks    Status New    Target Date 03/07/21             PT Long Term Goals - 02/07/21 1047      PT LONG TERM GOAL #1   Title Patient will be indeepdent with exercise progrma to promote quad strength and single leg stability    Baseline has only a limited HEP    Time 6    Period Weeks    Status New    Target Date 03/21/21      PT LONG TERM GOAL #2   Title Patient will demonstrate a  30 second left single leg stance in order to progress to dynamic single leg strength and stability    Baseline can not maintain single leg stance    Time 6    Period Weeks    Status New    Target Date 03/21/21                  Plan - 02/07/21 1036    Clinical Impression Statement Patient is an 20 year old female S/P  left ACL repair BTB with a right mecdial menisectomy. She is currently weight bearing as tolerated. She prestns with expected limitations in knee motion, strength, and functional mobility. Her pain is well contorled. She owuld benefit from skilled therapy to return to prior level of function    Examination-Activity Limitations Locomotion Level;Squat;Stairs;Stand    Examination-Participation Restrictions Community Activity    Stability/Clinical Decision Making Stable/Uncomplicated    Clinical Decision Making Low    Rehab Potential Excellent    PT Frequency 2x / week    PT Duration 8 weeks    PT Treatment/Interventions ADLs/Self Care Home Management;Electrical Stimulation;Ultrasound;Gait training;Stair training;Functional mobility training;Therapeutic activities;Therapeutic exercise;Patient/family education;Manual techniques;Passive range of motion;Scar mobilization;Taping    PT Next Visit Plan progress to SLR as tolerated, quad sets; progress to 1 crtuch as tolerated; russian if quad isn't firing;    PT Home Exercise Plan weight shifting; self flexion stretch with education on what 90 degrees looks like; extension stretch on towel    Consulted and Agree with Plan of Care Patient           Patient will benefit from skilled therapeutic intervention in order to improve the following deficits and impairments:  Abnormal gait,Difficulty walking,Decreased safety awareness,Decreased activity tolerance,Decreased strength,Decreased mobility,Increased edema,Pain  Visit Diagnosis: Other abnormalities of gait and mobility  Stiffness of left knee, not elsewhere classified  Chronic pain of left knee  Rupture of anterior cruciate ligament of left knee, subsequent encounter   Check all possible CPT codes: 01093- Therapeutic Exercise, 97112- Neuro Re-education, [phone] - Gait Training, 97140 - Manual Therapy, 97530 - Therapeutic Activities, 97535 - Self Care, 97014 - Electrical stimulation (unattended),  [id number] - Electrical stimulation (Manual) and [id number] - Ultrasound         Problem List There are no problems to display for this patient.   Dessie Coma 02/07/2021, 11:34 AM  [facility] [address] [place], [place], [address] Phone: [phone]   Fax:  [phone]  Name: Cheryl Cole MRN: [id number] Date of Birth: [birth year]

## 2021-02-07 NOTE — Patient Instructions (Signed)
Access Code: [email or web address]: [email or web address]: 04/08/2022Prepared by: Onalee Hua CarrollExercises  Forward Backward Weight Shift with Counter Support - 2-3 x daily - 7 x weekly - 2 sets - 10 reps  Side to Side Weight Shift with Counter Support - 2-3 x daily - 7 x weekly - 2 sets - 10 reps  Supine Quad Set - 1 x daily - 7 x weekly - 3 sets - 10 reps  Supine Knee Extension Stretch on Towel Roll - 2 x daily - 7 x weekly - 1 sets - 2-3 min hold  Supine Heel Slide with Strap - 1 x daily - 7 x weekly - 2 sets - 10 reps

## 2021-02-12 ENCOUNTER — Encounter: Payer: Self-pay | Admitting: Physical Therapy

## 2021-02-12 ENCOUNTER — Other Ambulatory Visit: Payer: Self-pay

## 2021-02-12 ENCOUNTER — Ambulatory Visit: Payer: PRIVATE HEALTH INSURANCE | Admitting: Physical Therapy

## 2021-02-12 DIAGNOSIS — G8929 Other chronic pain: Secondary | ICD-10-CM

## 2021-02-12 DIAGNOSIS — R2689 Other abnormalities of gait and mobility: Secondary | ICD-10-CM | POA: Diagnosis not present

## 2021-02-12 DIAGNOSIS — M25662 Stiffness of left knee, not elsewhere classified: Secondary | ICD-10-CM

## 2021-02-12 DIAGNOSIS — S83512D Sprain of anterior cruciate ligament of left knee, subsequent encounter: Secondary | ICD-10-CM

## 2021-02-12 NOTE — Therapy (Signed)
[facility] Outpatient [facility] [address] [place], [place], [address] Phone: [phone]   Fax:  [phone]  Physical Therapy Treatment  Patient Details  Name: Cheryl Cole MRN: [id number] Date of Birth: 05/17/2001 Referring Provider (PT): DR Ramond Marrow   Encounter Date: 02/12/2021   PT End of Session - 02/12/21 0751    Visit Number 2    Number of Visits 16    Date for PT Re-Evaluation 03/21/21    Authorization Type [employer] MCD    Authorization Time Period pending auth, submitted 02/11/2021    PT Start Time 0751    PT Stop Time 0836    PT Time Calculation (min) 45 min    Equipment Utilized During Treatment Left knee immobilizer   with ambulation   Activity Tolerance Patient tolerated treatment well    Behavior During Therapy [facility] for tasks assessed/performed           Past Medical History:  Diagnosis Date  . Torn meniscus     Past Surgical History:  Procedure Laterality Date  . KNEE ARTHROSCOPY WITH ANTERIOR CRUCIATE LIGAMENT (ACL) REPAIR Left 02/05/2021   Procedure: KNEE ARTHROSCOPY WITH ANTERIOR CRUCIATE LIGAMENT (ACL) REPAIR WITH AUTOGRAFT;  Surgeon: Bjorn Pippin, MD;  Location: [facility];  Service: Orthopedics;  Laterality: Left;  . KNEE ARTHROSCOPY WITH LATERAL MENISECTOMY  02/05/2021   Procedure: KNEE ARTHROSCOPY WITH LATERAL MENISECTOMY;  Surgeon: Bjorn Pippin, MD;  Location: [facility];  Service: Orthopedics;;  . KNEE ARTHROSCOPY WITH MEDIAL MENISECTOMY Left 02/05/2021   Procedure: KNEE ARTHROSCOPY WITH  MEDIAL MENISCUS REPAIR;  Surgeon: Bjorn Pippin, MD;  Location: [facility];  Service: Orthopedics;  Laterality: Left;  . NO PAST SURGERIES      There were no vitals filed for this visit.   Subjective Assessment - 02/12/21 0854    Subjective Patient reports she is doing well, minimal pain today. She feels the exercises are helping and she can put weight on her leg better while walking.     Patient Stated Goals Get back to previous level of activity    Currently in Pain? Yes    Pain Score 2     Pain Location Knee    Pain Orientation Left    Pain Descriptors / Indicators Aching;Tightness    Pain Type Surgical pain    Pain Onset 1 to 4 weeks ago    Pain Frequency Intermittent              [facility] PT Assessment - 02/12/21 0001      PROM   Left Knee Flexion 70      Strength   Overall Strength Comments Poor quad set prior to russian stim                         [facility] Adult PT Treatment/Exercise - 02/12/21 0001      Exercises   Exercises Knee/Hip      Knee/Hip Exercises: Stretches   Gastroc Stretch 3 reps;30 seconds    Gastroc Stretch Limitations longsitting with strap      Knee/Hip Exercises: Supine   Quad Sets 2 sets;10 reps   5 sec hold, towel roll under knee   Quad Sets Limitations x10 pre-russian, x10 post-russian, much improved quad activation post-russian    Heel Slides 10 reps   5 sec hold   Heel Slides Limitations AAROM using strap for assist and overpressure at end range  Straight Leg Raises 2 sets;5 reps   knee immobilizer donned   Straight Leg Raises Limitations patient did exhibit good quad activation, this was performed post-russian      Knee/Hip Exercises: Sidelying   Hip ABduction 10 reps   knee immobilizer donned   Hip ADduction 10 reps   knee immobilizer donned     Knee/Hip Exercises: Prone   Straight Leg Raises 10 reps   knee immobilizer donned     Modalities   Modalities Electrical Stimulation      Electrical Stimulation   Electrical Stimulation Location Left quad   2x4" rectangular pads   Electrical Stimulation Action Russian    Electrical Stimulation Parameters 50pps, 2 ramp, 10/50 on/off cycle x 10 min    Electrical Stimulation Goals Neuromuscular facilitation   attended with tactile cueing for quad activation     Manual Therapy   Manual Therapy Joint mobilization;Passive ROM    Joint Mobilization  Patellofemoral mobs all directions   limited secondary to dressing   Passive ROM Knee flexion seated edge of mat table with thigh supported 5 x 10 sec hold                  PT Education - 02/12/21 0751    Education Details HEP update, protocol information, using something 90 deg at home to tell how far to bend knee    Person(s) Educated Patient;Parent(s)   mother   Methods Explanation;Demonstration;Tactile cues;Verbal cues;Handout    Comprehension Verbalized understanding;Returned demonstration;Verbal cues required;Tactile cues required;Need further instruction            PT Short Term Goals - 02/07/21 1046      PT SHORT TERM GOAL #1   Title Patient will increase knee flexion to 90 degrees    Baseline 58    Time 4    Period Weeks    Status New    Target Date 03/07/21      PT SHORT TERM GOAL #2   Title Patient will demonstrate full extension    Baseline -3    Time 4    Period Weeks    Status New    Target Date 03/07/21      PT SHORT TERM GOAL #3   Title Patient will ambualte 300' without a device    Time 4    Period Weeks    Status New    Target Date 03/07/21             PT Long Term Goals - 02/07/21 1047      PT LONG TERM GOAL #1   Title Patient will be indeepdent with exercise progrma to promote quad strength and single leg stability    Baseline has only a limited HEP    Time 6    Period Weeks    Status New    Target Date 03/21/21      PT LONG TERM GOAL #2   Title Patient will demonstrate a 30 second left single leg stance in order to progress to dynamic single leg strength and stability    Baseline can not maintain single leg stance    Time 6    Period Weeks    Status New    Target Date 03/21/21                 Plan - 02/12/21 0845    Clinical Impression Statement Patient tolerated therapy well with no adverse effects. She was walking much better entering therapy using WBAT with bilat  crutches and knee immobilizer. Patient's range of  motion improved this visit to 70 deg knee flexion in supine with greater flexion in seated edge of mat but not exceeding 90 deg per protocol. Patient initially with poor quad activation so [place] of [place] with goo carryover and patient able to perform much improve quad set following e-stim. Progressed HEP to performing quad set much more frequently throughout the day and addition of 4-way hip with knee immobilizer donned. She was also instructed to perform heel slide in AAROM manner and use strap for overpressure while remaining below 90 deg per protocol. Patient would benefit from continued skilled PT to reduce pain, progress motion, strength, and gait to return to prior level of function.    PT Treatment/Interventions ADLs/Self Care Home Management;Electrical Stimulation;Ultrasound;Gait training;Stair training;Functional mobility training;Therapeutic activities;Therapeutic exercise;Patient/family education;Manual techniques;Passive range of motion;Scar mobilization;Taping    PT Next Visit Plan Review HEP and progress PRN, patellar mobilizations, continue focus on quad activation with russian PRN progressing to SLR as long as no extension lag, active knee extension/hyperextension equal to opposite side, progress knee flexion to 90 as tolerated, 4-way hip and prone hamstring curls with use of BFR, gait training PRN    PT Home Exercise Plan [email or web address]    Consulted and Agree with Plan of Care Patient           Patient will benefit from skilled therapeutic intervention in order to improve the following deficits and impairments:  Abnormal gait,Difficulty walking,Decreased safety awareness,Decreased activity tolerance,Decreased strength,Decreased mobility,Increased edema,Pain  Visit Diagnosis: Chronic pain of left knee  Stiffness of left knee, not elsewhere classified  Other abnormalities of gait and mobility  Rupture of anterior cruciate ligament of left knee, subsequent encounter     Problem  List There are no problems to display for this patient.   Rosana Hoes, PT, DPT, LAT, ATC 02/12/21  9:19 AM Phone: [phone] Fax: [phone]   [facility] [address] [place], [place], [address] Phone: [phone]   Fax:  [phone]  Name: Cheryl Cole MRN: [id number] Date of Birth: 2001/08/04

## 2021-02-12 NOTE — Patient Instructions (Signed)
Access Code: [email or web address] URL: [email or web address] Date: 02/12/2021 Prepared by: Rosana Hoes  Exercises Forward Backward Weight Shift with Counter Support - 2-3 x daily - 7 x weekly - 2 sets - 10 reps Side to Side Weight Shift with Counter Support - 2-3 x daily - 7 x weekly - 2 sets - 10 reps Supine Quad Set - 5-10 x daily - 7 x weekly - 10 reps - 5 seconds hold Supine Knee Extension Stretch on Towel Roll - 2 x daily - 7 x weekly - 2-3 min hold Supine Heel Slide with Strap - 2-3 x daily - 7 x weekly - 10 reps - 5 hold Long Sitting Calf Stretch with Strap - 2-3 x daily - 7 x weekly - 3 reps - 30 seconds hold Active Straight Leg Raise with Quad Set - 1 x daily - 7 x weekly - 2 sets - 10 reps Sidelying Hip Abduction - 1 x daily - 7 x weekly - 2 sets - 10 reps Sidelying Hip Adduction - 1 x daily - 7 x weekly - 2 sets - 10 reps Prone Hip Extension - 1 x daily - 7 x weekly - 2 sets - 10 reps

## 2021-02-17 ENCOUNTER — Ambulatory Visit: Payer: PRIVATE HEALTH INSURANCE | Admitting: Physical Therapy

## 2021-02-17 ENCOUNTER — Encounter: Payer: Self-pay | Admitting: Physical Therapy

## 2021-02-17 ENCOUNTER — Other Ambulatory Visit: Payer: Self-pay

## 2021-02-17 DIAGNOSIS — S83512D Sprain of anterior cruciate ligament of left knee, subsequent encounter: Secondary | ICD-10-CM

## 2021-02-17 DIAGNOSIS — M25662 Stiffness of left knee, not elsewhere classified: Secondary | ICD-10-CM

## 2021-02-17 DIAGNOSIS — G8929 Other chronic pain: Secondary | ICD-10-CM

## 2021-02-17 DIAGNOSIS — R2689 Other abnormalities of gait and mobility: Secondary | ICD-10-CM | POA: Diagnosis not present

## 2021-02-17 DIAGNOSIS — M25562 Pain in left knee: Secondary | ICD-10-CM

## 2021-02-17 NOTE — Therapy (Signed)
[facility] [address] [place], [place], [address] Phone: [phone]   Fax:  [phone]  Physical Therapy Treatment  Patient Details  Name: Cheryl Cole MRN: [id number] Date of Birth: Nov 27, 2000 Referring Provider (PT): DR Ramond Marrow   Encounter Date: 02/17/2021   PT End of Session - 02/17/21 0758    Visit Number 3    Number of Visits 16    Date for PT Re-Evaluation 03/21/21    Authorization Type UHC MCD    Authorization Time Period pending auth, submitted 02/11/2021    PT Start Time 0751    PT Stop Time 0830    PT Time Calculation (min) 39 min    Equipment Utilized During Treatment Left knee immobilizer   with weight bearing   Activity Tolerance Patient tolerated treatment well    Behavior During Therapy WFL for tasks assessed/performed           Past Medical History:  Diagnosis Date  . Torn meniscus     Past Surgical History:  Procedure Laterality Date  . KNEE ARTHROSCOPY WITH ANTERIOR CRUCIATE LIGAMENT (ACL) REPAIR Left 02/05/2021   Procedure: KNEE ARTHROSCOPY WITH ANTERIOR CRUCIATE LIGAMENT (ACL) REPAIR WITH AUTOGRAFT;  Surgeon: Bjorn Pippin, MD;  Location: [facility];  Service: Orthopedics;  Laterality: Left;  . KNEE ARTHROSCOPY WITH LATERAL MENISECTOMY  02/05/2021   Procedure: KNEE ARTHROSCOPY WITH LATERAL MENISECTOMY;  Surgeon: Bjorn Pippin, MD;  Location: [facility];  Service: Orthopedics;;  . KNEE ARTHROSCOPY WITH MEDIAL MENISECTOMY Left 02/05/2021   Procedure: KNEE ARTHROSCOPY WITH  MEDIAL MENISCUS REPAIR;  Surgeon: Bjorn Pippin, MD;  Location: [facility];  Service: Orthopedics;  Laterality: Left;  . NO PAST SURGERIES      There were no vitals filed for this visit.   Subjective Assessment - 02/17/21 0852    Subjective Patient reports she is doing well with no new issues. She saw the surgeon and he is pleased with progress, unlocked knee immobilizer to 40 deg  with weight bearing. Exercises are going well and getting easier.    Patient Stated Goals Get back to previous level of activity    Currently in Pain? No/denies              [facility] PT Assessment - 02/17/21 0001      Assessment   Next MD Visit --      Restrictions   Weight Bearing Restrictions Yes    LLE Weight Bearing Weight bearing as tolerated   knee immobilizer donned and unlocked to 40 deg     AROM   AROM Assessment Site Knee    Right/Left Knee Right;Left    Right Knee Extension 5   hyper   Left Knee Extension 0    Left Knee Flexion 86      PROM   PROM Assessment Site Knee    Right/Left Knee Left    Left Knee Extension 4   hyper     Strength   Overall Strength Comments Patient able to perform good quad set, SLR without lag following cues                         [facility] Adult PT Treatment/Exercise - 02/17/21 0001      Ambulation/Gait   Ambulation/Gait Yes    Ambulation/Gait Assistance 6: Modified independent (Device/Increase time)    Assistive device R Axillary Crutch    Gait Comments Patient cued  for proper form with single axillary crutch on right, cued for proper heel-toe progression and quad control with stance   knee immobilizer donned and unlocked to 40 deg per surgeon     Exercises   Exercises Knee/Hip      Knee/Hip Exercises: Stretches   Gastroc Stretch 2 reps;30 seconds    Gastroc Stretch Limitations longsitting with strap      Knee/Hip Exercises: Supine   Quad Sets 10 reps   5 sec hold   Quad Sets Limitations towel under knee for cueing    Heel Slides 10 reps   2 sets, 5 sec hold   Heel Slides Limitations active within allowable range    Terminal Knee Extension 2 sets;10 reps   3 sec hold   Terminal Knee Extension Limitations supine quad set with strap around foot to pull knee into hyperextension    Straight Leg Raises 3 sets;5 reps    Straight Leg Raises Limitations without brace, cueing for quad set with towel under knee for cueing       Knee/Hip Exercises: Sidelying   Hip ABduction 2 sets;10 reps      Knee/Hip Exercises: Prone   Hamstring Curl 2 sets;10 reps    Straight Leg Raises 2 sets;10 reps    Other Prone Exercises ATKE 2 x 10 with 3 sec hold      Manual Therapy   Manual Therapy Joint mobilization    Joint Mobilization Patellofemoral mobs all directions                  PT Education - 02/17/21 0755    Education Details HEP    Person(s) Educated Patient;Parent(s)    Methods Explanation;Demonstration;Tactile cues;Verbal cues;Handout    Comprehension Verbalized understanding;Returned demonstration;Verbal cues required;Tactile cues required;Need further instruction            PT Short Term Goals - 02/07/21 1046      PT SHORT TERM GOAL #1   Title Patient will increase knee flexion to 90 degrees    Baseline 58    Time 4    Period Weeks    Status New    Target Date 03/07/21      PT SHORT TERM GOAL #2   Title Patient will demonstrate full extension    Baseline -3    Time 4    Period Weeks    Status New    Target Date 03/07/21      PT SHORT TERM GOAL #3   Title Patient will ambualte 300' without a device    Time 4    Period Weeks    Status New    Target Date 03/07/21             PT Long Term Goals - 02/07/21 1047      PT LONG TERM GOAL #1   Title Patient will be indeepdent with exercise progrma to promote quad strength and single leg stability    Baseline has only a limited HEP    Time 6    Period Weeks    Status New    Target Date 03/21/21      PT LONG TERM GOAL #2   Title Patient will demonstrate a 30 second left single leg stance in order to progress to dynamic single leg strength and stability    Baseline can not maintain single leg stance    Time 6    Period Weeks    Status New    Target Date 03/21/21  Plan - 02/17/21 0814    Clinical Impression Statement Patient tolerated therapy well with no adverse effects. Patient saw the surgeon  since last visit and had knee immobilizer unlocked to 40 deg for weight bearing activities. She exhibits much improved range of motion and quad activation this visit without any increase in pain. Active knee flexion in supine with 86 this visit and she exhibits 4 deg hyperextension but unable to perform active hyper extension so incorporated exercises to improve active hyperextension this visit. Patient able to perform SLR without extension lag with cueing so instructed her to perform these without brace at home. Patient progressed to single axillary crutch this visit with good form. Patient would benefit from continued skilled PT to reduce pain, progress motion, strength, and gait to return to prior level of function.    PT Treatment/Interventions ADLs/Self Care Home Management;Electrical Stimulation;Ultrasound;Gait training;Stair training;Functional mobility training;Therapeutic activities;Therapeutic exercise;Patient/family education;Manual techniques;Passive range of motion;Scar mobilization;Taping    PT Next Visit Plan Review HEP and progress PRN, patellar mobilizations, continue focus on quad activation with russian PRN progressing SLR as long as no extension lag, active knee hyperextension equal to opposite side, progress knee flexion to 90 as tolerated, 4-way hip and prone hamstring curls with use of BFR, gait training to no AD as able with good quad control    PT Home Exercise Plan [email or web address]    Consulted and Agree with Plan of Care Patient           Patient will benefit from skilled therapeutic intervention in order to improve the following deficits and impairments:  Abnormal gait,Difficulty walking,Decreased safety awareness,Decreased activity tolerance,Decreased strength,Decreased mobility,Increased edema,Pain  Visit Diagnosis: Chronic pain of left knee  Stiffness of left knee, not elsewhere classified  Other abnormalities of gait and mobility  Rupture of anterior cruciate ligament of  left knee, subsequent encounter     Problem List There are no problems to display for this patient.   Rosana Hoes, PT, DPT, LAT, ATC 02/17/21  8:56 AM Phone: [phone] Fax: [phone]   [facility] [address] [place], [place], [address] Phone: [phone]   Fax:  [phone]  Name: Cheryl Cole MRN: [id number] Date of Birth: [birth year]

## 2021-02-17 NOTE — Patient Instructions (Signed)
Access Code: [email or web address] URL: [email or web address] Date: 02/17/2021 Prepared by: Rosana Hoes  Exercises Supine Quad Set - 5-10 x daily - 7 x weekly - 10 reps - 5 seconds hold Long Sitting Quad Set - 2-3 x daily - 7 x weekly - 2 sets - 10 reps - 3-5 seconds hold Supine Heel Slide with Strap - 2-3 x daily - 7 x weekly - 10 reps - 5 hold Long Sitting Calf Stretch with Strap - 2-3 x daily - 7 x weekly - 3 reps - 30 seconds hold Long Sitting Ankle Plantar Flexion with Resistance - 1 x daily - 7 x weekly - 2-3 sets - 20 reps Active Straight Leg Raise with Quad Set - 1 x daily - 7 x weekly - 3 sets - 5 reps Sidelying Hip Abduction - 1 x daily - 7 x weekly - 2 sets - 10 reps Sidelying Hip Adduction - 1 x daily - 7 x weekly - 2 sets - 10 reps Prone Hip Extension - 1 x daily - 7 x weekly - 2 sets - 10 reps

## 2021-02-20 ENCOUNTER — Ambulatory Visit: Payer: PRIVATE HEALTH INSURANCE

## 2021-02-20 ENCOUNTER — Ambulatory Visit: Payer: PRIVATE HEALTH INSURANCE | Admitting: Physical Therapy

## 2021-02-20 ENCOUNTER — Ambulatory Visit: Payer: PRIVATE HEALTH INSURANCE | Admitting: Rehabilitative and Restorative Service Providers"

## 2021-02-20 ENCOUNTER — Other Ambulatory Visit: Payer: Self-pay

## 2021-02-20 DIAGNOSIS — G8929 Other chronic pain: Secondary | ICD-10-CM

## 2021-02-20 DIAGNOSIS — R2689 Other abnormalities of gait and mobility: Secondary | ICD-10-CM | POA: Diagnosis not present

## 2021-02-20 DIAGNOSIS — M25662 Stiffness of left knee, not elsewhere classified: Secondary | ICD-10-CM

## 2021-02-20 DIAGNOSIS — S83512D Sprain of anterior cruciate ligament of left knee, subsequent encounter: Secondary | ICD-10-CM

## 2021-02-20 NOTE — Therapy (Signed)
[facility] [address] [place], [place], [address] Phone: [phone]   Fax:  [phone]  Physical Therapy Treatment  Patient Details  Name: Cheryl Cole. Luse MRN: [id number] Date of Birth: Jun 30, 2001 Referring Provider (PT): DR Ramond Marrow   Encounter Date: 02/20/2021   PT End of Session - 02/20/21 1325    Visit Number 4    Number of Visits 16    Date for PT Re-Evaluation 03/21/21    Authorization Type UHC MCD    Authorization Time Period 4/13-4/30/22    Authorization - Visit Number 3    Authorization - Number of Visits 4    PT Start Time 1241   patient late   PT Stop Time 1321    PT Time Calculation (min) 40 min    Equipment Utilized During Treatment Left knee immobilizer   with weight bearing   Activity Tolerance Patient tolerated treatment well    Behavior During Therapy WFL for tasks assessed/performed           Past Medical History:  Diagnosis Date  . Torn meniscus     Past Surgical History:  Procedure Laterality Date  . KNEE ARTHROSCOPY WITH ANTERIOR CRUCIATE LIGAMENT (ACL) REPAIR Left 02/05/2021   Procedure: KNEE ARTHROSCOPY WITH ANTERIOR CRUCIATE LIGAMENT (ACL) REPAIR WITH AUTOGRAFT;  Surgeon: Bjorn Pippin, MD;  Location: [facility];  Service: Orthopedics;  Laterality: Left;  . KNEE ARTHROSCOPY WITH LATERAL MENISECTOMY  02/05/2021   Procedure: KNEE ARTHROSCOPY WITH LATERAL MENISECTOMY;  Surgeon: Bjorn Pippin, MD;  Location: [facility];  Service: Orthopedics;;  . KNEE ARTHROSCOPY WITH MEDIAL MENISECTOMY Left 02/05/2021   Procedure: KNEE ARTHROSCOPY WITH  MEDIAL MENISCUS REPAIR;  Surgeon: Bjorn Pippin, MD;  Location: [facility];  Service: Orthopedics;  Laterality: Left;  . NO PAST SURGERIES      There were no vitals filed for this visit.   Subjective Assessment - 02/20/21 1244    Subjective Patient reports she is doing good without complaints of pain. Only time she  has pain/soreness is after PT sessions. She reports using single axillary crutch has been going well.    Patient is accompained by: Family member   mother   Patient Stated Goals Get back to previous level of activity    Currently in Pain? No/denies              [facility] PT Assessment - 02/20/21 0001      AROM   Right Knee Extension --    Left Knee Flexion 90                         [facility] Adult PT Treatment/Exercise - 02/20/21 0001      Self-Care   Self-Care Other Self-Care Comments    Other Self-Care Comments  updated HEP      Knee/Hip Exercises: Doctor, hospital 60 seconds    Gastroc Stretch Limitations longsittign with strap      Knee/Hip Exercises: Standing   Heel Raises 15 reps   knee immobilizer donned   Heel Raises Limitations x 2    SLS 3 x 20 seconds bilateral   knee immobilizer donned   Other Standing Knee Exercises weight shift lateral 1 x 10   knee immobilizer donned     Knee/Hip Exercises: Supine   Straight Leg Raises 10 reps    Straight Leg Raises Limitations without brace; 2 sets  Other Supine Knee/Hip Exercises SLR square trace 2 x5      Knee/Hip Exercises: Sidelying   Hip ABduction 2 sets;15 reps    Hip ABduction Limitations LLE    Hip ADduction 15 reps;2 sets    Hip ADduction Limitations LLE    Other Sidelying Knee/Hip Exercises sidelying leg taps fwd/bwd 2 x 10 LLE      Knee/Hip Exercises: Prone   Straight Leg Raises 15 reps;2 sets      Manual Therapy   Joint Mobilization Patellofemoral mobs all directions                  PT Education - 02/20/21 1328    Education Details Updated HEP.    Person(s) Educated Patient;Parent(s)    Methods Explanation;Demonstration;Verbal cues;Handout    Comprehension Verbalized understanding;Returned demonstration;Verbal cues required            PT Short Term Goals - 02/07/21 1046      PT SHORT TERM GOAL #1   Title Patient will increase knee flexion to 90 degrees     Baseline 58    Time 4    Period Weeks    Status New    Target Date 03/07/21      PT SHORT TERM GOAL #2   Title Patient will demonstrate full extension    Baseline -3    Time 4    Period Weeks    Status New    Target Date 03/07/21      PT SHORT TERM GOAL #3   Title Patient will ambualte 300' without a device    Time 4    Period Weeks    Status New    Target Date 03/07/21             PT Long Term Goals - 02/07/21 1047      PT LONG TERM GOAL #1   Title Patient will be indeepdent with exercise progrma to promote quad strength and single leg stability    Baseline has only a limited HEP    Time 6    Period Weeks    Status New    Target Date 03/21/21      PT LONG TERM GOAL #2   Title Patient will demonstrate a 30 second left single leg stance in order to progress to dynamic single leg strength and stability    Baseline can not maintain single leg stance    Time 6    Period Weeks    Status New    Target Date 03/21/21                 Plan - 02/20/21 1311    Clinical Impression Statement Patient tolerated session well today with progression of OK[place] strengthening and weightbearing activity. ROM is gradually improving achieving 90 degrees of active knee flexion this visit. Her quad control is improving with ability to complete 10 reps of SLR without quad lag. She reported mild sharp pain along anterior knee towards end of first set of SLR. Superior/inferior patellar mobilizations were performed prior to second set with patient reporting no sharp pain during second set of SLR. Introduced SL balance with knee immobilizer donned with patient demonstrating minimal sway. Recommended to continue with single axillary crutch at this time. Patient reported no pain at end of session.    PT Treatment/Interventions ADLs/Self Care Home Management;Electrical Stimulation;Ultrasound;Gait training;Stair training;Functional mobility training;Therapeutic activities;Therapeutic  exercise;Patient/family education;Manual techniques;Passive range of motion;Scar mobilization;Taping    PT Next Visit Plan Review HEP  and progress PRN, patellar mobilizations, continue focus on quad activation with russian PRN progressing SLR as long as no extension lag, active knee hyperextension equal to opposite side, progress knee flexion to 90 as tolerated, 4-way hip and prone hamstring curls with use of BFR, gait training to no AD as able with good quad control    PT Home Exercise Plan [email or web address]    Consulted and Agree with Plan of Care Patient;Family member/caregiver    Family Member Consulted mother           Patient will benefit from skilled therapeutic intervention in order to improve the following deficits and impairments:  Abnormal gait,Difficulty walking,Decreased safety awareness,Decreased activity tolerance,Decreased strength,Decreased mobility,Increased edema,Pain  Visit Diagnosis: Chronic pain of left knee  Stiffness of left knee, not elsewhere classified  Other abnormalities of gait and mobility  Rupture of anterior cruciate ligament of left knee, subsequent encounter     Problem List There are no problems to display for this patient.  Letitia Libra, PT, DPT, ATC 02/20/21 1:42 PM  [facility] [address] [place], [place], [address] Phone: [phone]   Fax:  [phone]  Name: Charelle Petrakis. Colclasure MRN: [id number] Date of Birth: 22-Nov-2000

## 2021-02-26 ENCOUNTER — Other Ambulatory Visit: Payer: Self-pay

## 2021-02-26 ENCOUNTER — Ambulatory Visit: Payer: PRIVATE HEALTH INSURANCE | Admitting: Physical Therapy

## 2021-02-26 ENCOUNTER — Encounter: Payer: Self-pay | Admitting: Physical Therapy

## 2021-02-26 DIAGNOSIS — G8929 Other chronic pain: Secondary | ICD-10-CM

## 2021-02-26 DIAGNOSIS — R2689 Other abnormalities of gait and mobility: Secondary | ICD-10-CM | POA: Diagnosis not present

## 2021-02-26 DIAGNOSIS — M25662 Stiffness of left knee, not elsewhere classified: Secondary | ICD-10-CM

## 2021-02-26 DIAGNOSIS — S83512D Sprain of anterior cruciate ligament of left knee, subsequent encounter: Secondary | ICD-10-CM

## 2021-02-26 DIAGNOSIS — M25562 Pain in left knee: Secondary | ICD-10-CM

## 2021-02-26 NOTE — Therapy (Addendum)
[place], [place], [address] Phone: [phone]   Fax:  [phone]  Physical Therapy Treatment  Patient Details  Name: Cheryl Cole. [place] MRN: [id number] Date of Birth: Nov 29, 2000 Referring Provider (PT): DR Ophelia Charter   Encounter Date: 02/26/2021   PT End of Session - 02/26/21 1125    Visit Number 5    Number of Visits 16    Date for PT Re-Evaluation 03/21/21    Authorization Type UHC MCD    Authorization Time Period 4/13-4/30/22    Authorization - Visit Number 4    Authorization - Number of Visits 4    PT Start Time 4128    PT Stop Time 1056    PT Time Calculation (min) 41 min    Equipment Utilized During Treatment Left knee immobilizer    Activity Tolerance Patient tolerated treatment well    Behavior During Therapy WFL for tasks assessed/performed           Past Medical History:  Diagnosis Date  . Torn meniscus     Past Surgical History:  Procedure Laterality Date  . KNEE ARTHROSCOPY WITH ANTERIOR CRUCIATE LIGAMENT (ACL) REPAIR Left 02/05/2021   Procedure: KNEE ARTHROSCOPY WITH ANTERIOR CRUCIATE LIGAMENT (ACL) REPAIR WITH AUTOGRAFT;  Surgeon: Hiram Gash, MD;  Location: [facility];  Service: Orthopedics;  Laterality: Left;  . KNEE ARTHROSCOPY WITH LATERAL MENISECTOMY  02/05/2021   Procedure: KNEE ARTHROSCOPY WITH LATERAL MENISECTOMY;  Surgeon: Hiram Gash, MD;  Location: [facility];  Service: Orthopedics;;  . KNEE ARTHROSCOPY WITH MEDIAL MENISECTOMY Left 02/05/2021   Procedure: KNEE ARTHROSCOPY WITH  MEDIAL MENISCUS REPAIR;  Surgeon: Hiram Gash, MD;  Location: [facility];  Service: Orthopedics;  Laterality: Left;  . NO PAST SURGERIES      There were no vitals filed for this visit.   Subjective Assessment - 02/26/21 1017    Subjective Pt reports she is doing well. On sunday pt was having a 3/10 pain with discomfort but reports no pain today.     Patient is accompained by: Family member    Pain Score 0-No pain    Pain Location Knee    Pain Orientation Left              [facility] PT Assessment - 02/26/21 0001      AROM   Left Knee Extension -1   0 degrees following exercise   Left Knee Flexion 90                         [facility] Adult PT Treatment/Exercise - 02/26/21 0001      Ambulation/Gait   Ambulation/Gait Yes    Ambulation/Gait Assistance 6: Modified independent (Device/Increase time)    Assistive device R Axillary Crutch    Gait Comments Patient cued for proper form with single axillary crutch on right, pt did well with proper heel-toe progression and quad control with stance for 283f   knee immobilizer donned and unlocked to 40 deg per surgeon     Knee/Hip Exercises: Stretches   Active Hamstring Stretch 30 seconds;1 rep    Gastroc Stretch 2 reps;Left;30 seconds    Gastroc Stretch Limitations longsitting with strap      Knee/Hip Exercises: Standing   Heel Raises 15 reps    Heel Raises Limitations x 2    SLS 3 x 20 seconds bilateral   knee immobilizer donned, L and R  Other Standing Knee Exercises weight shift lateral 1 x 15, standing hip abduction, extension, flexion 10x each   knee immobilizer donned     Knee/Hip Exercises: Supine   Quad Sets 10 reps    Quad Sets Limitations towel under heel    Straight Leg Raises 10 reps    Straight Leg Raises Limitations without brace 1 set, pt had some quad lag during exercise      Knee/Hip Exercises: Sidelying   Hip ABduction Left;3 sets;10 reps   cues for going slow     Knee/Hip Exercises: Prone   Hamstring Curl 2 sets;10 reps    Straight Leg Raises 3 sets;Left;10 reps   cues for slowing down   Other Prone Exercises ATKE 2 x 10 with 5 sec hold      Manual Therapy   Joint Mobilization Patellofemoral mobs all directions                    PT Short Term Goals - 02/26/21 1123      PT SHORT TERM GOAL #1   Title Patient will increase knee  flexion to 90 degrees    Baseline 90    Time 4    Period Weeks    Status Achieved    Target Date 03/07/21      PT SHORT TERM GOAL #2   Title Patient will demonstrate full extension    Baseline 0 following treatment    Time 4    Period Weeks    Status Achieved    Target Date 03/07/21      PT SHORT TERM GOAL #3   Title Patient will ambualte 300' without a device    Baseline hinge brace donned and 1 axillary crutch    Time 4    Period Weeks    Status On-going    Target Date 03/07/21             PT Long Term Goals - 02/26/21 1124      PT LONG TERM GOAL #1   Title Patient will be indeepdent with exercise progrma to promote quad strength and single leg stability    Time 6    Period Weeks    Status On-going    Target Date 03/21/21      PT LONG TERM GOAL #2   Title Patient will demonstrate a 30 second left single leg stance in order to progress to dynamic single leg strength and stability    Baseline able to maintain 20 sec SLS    Time 6    Period Weeks    Status On-going    Target Date 03/21/21                 Plan - 02/26/21 1108    Clinical Impression Statement Pt tolerated treatment well today. Pt arrived 3 weeks post surgery ambulating with 1 crunch and wearing a hinge brace donned and unlocked to 40 degrees. She has met STG#2 and #3. Pt arrived with 90 degress knee flexion and -1 degrees knee extension at beginning of treatment with improvement to 0 degrees following exercises. Pt is continuing to get stronger and have greater ROM but is limited by her protocol. Patellofemoral joint mobilizations were performed and pt was able to increase ROM during prone knee flexion. Pt continues to have extension lag during SLR. She was able to progress to dynamic SLS exercises. Patient would benefit from continued skilled PT to reduce pain, progress motion, strength, and gait to return  to prior level of function.    PT Next Visit Plan Review HEP and progress PRN, patellar  mobilizations, continue focus on quad activation with russian PRN progressing SLR as long as no extension lag, active knee hyperextension equal to opposite side, progress knee flexion to 90 as tolerated, 4-way hip and prone hamstring curls with use of BFR, gait training to no AD as able with good quad control    PT Home Exercise Plan [email or web address]           Patient will benefit from skilled therapeutic intervention in order to improve the following deficits and impairments:  Abnormal gait,Difficulty walking,Decreased safety awareness,Decreased activity tolerance,Decreased strength,Decreased mobility,Increased edema,Pain  Visit Diagnosis: Chronic pain of left knee  Stiffness of left knee, not elsewhere classified  Other abnormalities of gait and mobility  Rupture of anterior cruciate ligament of left knee, subsequent encounter     Problem List There are no problems to display for this patient.   Dawayne Cirri, SPTA 02/26/2021, 11:45 AM  [facility] [address] [place], [place], [address] Phone: [phone]   Fax:  [phone]  Name: Cheryl Cole MRN: [id number] Date of Birth: 2001/10/24  Check all possible CPT codes: 28208- Therapeutic Exercise, [phone]- Neuro Re-education, [phone] - Gait Training, [phone] - Manual Therapy, 97530 - Therapeutic Activities, 50158 - Self Care, 97014 - Electrical stimulation (unattended), [id number] - Electrical stimulation (Manual), [id number] - Vaso and [id number] - Aquatic therapy

## 2021-02-28 ENCOUNTER — Other Ambulatory Visit: Payer: Self-pay

## 2021-02-28 ENCOUNTER — Encounter: Payer: Self-pay | Admitting: Physical Therapy

## 2021-02-28 ENCOUNTER — Ambulatory Visit: Payer: PRIVATE HEALTH INSURANCE | Admitting: Physical Therapy

## 2021-02-28 DIAGNOSIS — R2689 Other abnormalities of gait and mobility: Secondary | ICD-10-CM

## 2021-02-28 DIAGNOSIS — M25662 Stiffness of left knee, not elsewhere classified: Secondary | ICD-10-CM

## 2021-02-28 DIAGNOSIS — S83512D Sprain of anterior cruciate ligament of left knee, subsequent encounter: Secondary | ICD-10-CM

## 2021-02-28 DIAGNOSIS — G8929 Other chronic pain: Secondary | ICD-10-CM

## 2021-02-28 NOTE — Therapy (Addendum)
[facility] Outpatient Rehabilitation [facility] [address] [place], [place], [address] Phone: [phone]   Fax:  [phone]  Physical Therapy Treatment  Patient Details  Name: Cheryl Cole MRN: [id number] Date of Birth: 2001/02/04 Referring Provider (PT): DR Ramond Marrow   Encounter Date: 02/28/2021   PT End of Session - 02/28/21 0910     Visit Number 6    Number of Visits 16    Date for PT Re-Evaluation 03/21/21    Authorization Type U[employer] MCD    Authorization Time Period pending    PT Start Time 0912    PT Stop Time 0955    PT Time Calculation (min) 43 min    Equipment Utilized During Treatment Left knee immobilizer    Activity Tolerance Patient tolerated treatment well    Behavior During Therapy WFL for tasks assessed/performed             Past Medical History:  Diagnosis Date   Torn meniscus     Past Surgical History:  Procedure Laterality Date   KNEE ARTHROSCOPY WITH ANTERIOR CRUCIATE LIGAMENT (ACL) REPAIR Left 02/05/2021   Procedure: KNEE ARTHROSCOPY WITH ANTERIOR CRUCIATE LIGAMENT (ACL) REPAIR WITH AUTOGRAFT;  Surgeon: Bjorn Pippin, MD;  Location: [facility];  Service: Orthopedics;  Laterality: Left;   KNEE ARTHROSCOPY WITH LATERAL MENISECTOMY  02/05/2021   Procedure: KNEE ARTHROSCOPY WITH LATERAL MENISECTOMY;  Surgeon: Bjorn Pippin, MD;  Location: [facility];  Service: Orthopedics;;   KNEE ARTHROSCOPY WITH MEDIAL MENISECTOMY Left 02/05/2021   Procedure: KNEE ARTHROSCOPY WITH  MEDIAL MENISCUS REPAIR;  Surgeon: Bjorn Pippin, MD;  Location: [facility];  Service: Orthopedics;  Laterality: Left;   NO PAST SURGERIES      There were no vitals filed for this visit.   Subjective Assessment - 02/28/21 0917     Subjective No pain this week. Exercises are going well.    Patient is accompained by: Family member    Currently in Pain? No/denies                  [facility] PT Assessment - 02/28/21 0001        AROM   Left Knee Extension -3    Left Knee Flexion 87                                        [facility] Adult PT Treatment/Exercise - 02/28/21 0001       Ambulation/Gait   Gait Comments 3 laps from UE ranger to window with no axillary crutch: increased R lateral weight shift noted and slight circumduction, increased knee flexion as laps progressed and cueing to bend knee      Knee/Hip Exercises: Standing   Heel Raises 15 reps    Heel Raises Limitations x 2    Knee Flexion Left;2 sets;10 reps    Knee Flexion Limitations tightness at quad tendon sight; brace not donned    SLS x30" static, 2x30" R arcs with L SLS, L SLS with taps to cone on counter x10, L SLS with lateral taps to cone on counter x10, L SLS x10 palloff press yellow band x10 both ways      Knee/Hip Exercises: Seated   Long Arc Quad Left;2 sets;10 reps    Long Arc Quad Limitations 90-45 degrees      Knee/Hip Exercises: Supine   Straight Leg Raises  10 reps;2 sets    Straight Leg Raises Limitations without brace, no extension lag noted      Knee/Hip Exercises: Sidelying   Hip ABduction Left;10 reps    Hip ABduction Limitations LLE rainbows      Knee/Hip Exercises: Prone   Other Prone Exercises TKE 2x10 with 5 second hold 2# on posterior knee      Manual Therapy   Joint Mobilization Patellofemoral mobs all directions                          PT Education - 02/28/21 0912     Education Details HEP update, walk without axillary crutch but carry crutch around as needed for fatigue    Person(s) Educated Patient;Parent(s)    Methods Explanation;Demonstration;Verbal cues;Handout    Comprehension Verbalized understanding;Returned demonstration;Need further instruction              PT Short Term Goals - 02/26/21 1123       PT SHORT TERM GOAL #1   Title Patient will increase knee flexion to 90 degrees    Baseline 90    Time 4    Period Weeks    Status Achieved    Target Date  03/07/21      PT SHORT TERM GOAL #2   Title Patient will demonstrate full extension    Baseline 0 following treatment    Time 4    Period Weeks    Status Achieved    Target Date 03/07/21      PT SHORT TERM GOAL #3   Title Patient will ambualte 300' without a device    Baseline hinge brace donned and 1 axillary crutch    Time 4    Period Weeks    Status On-going    Target Date 03/07/21                PT Long Term Goals - 02/26/21 1124       PT LONG TERM GOAL #1   Title Patient will be indeepdent with exercise progrma to promote quad strength and single leg stability    Time 6    Period Weeks    Status On-going    Target Date 03/21/21      PT LONG TERM GOAL #2   Title Patient will demonstrate a 30 second left single leg stance in order to progress to dynamic single leg strength and stability    Baseline able to maintain 20 sec SLS    Time 6    Period Weeks    Status On-going    Target Date 03/21/21                        Plan - 02/28/21 1004     Clinical Impression Statement Pt tolerated tx well with no adverse effects. Dynamic SLS done this session with most difficulty during palloff presses SLS for core engagement. Trialed ambulation with no crutch and brace donned and locked at 40 degrees and tolerated well and progressed towards more natural knee flexion during termnial stance and swing phase. Standing hamstring curls initiated with increased tension/tightness noted at end of hamstring curl at about 80 degrees. Continues to benefit from skilled PT in order to progress ROM, SLS stability, and LE strength while under guidance of protocol to return to PLOF, increase ambulation, and volleyball.    PT Treatment/Interventions ADLs/Self Care Home Management;Electrical Stimulation;Ultrasound;Gait training;Stair training;Functional mobility training;Therapeutic activities;Therapeutic exercise;Patient/family education;Manual  techniques;Passive range of motion;Scar  mobilization;Taping    PT Next Visit Plan Review HEP and progress PRN, hamstring curls, SLS dynamic challenges, patellar mobilizations, continue focus on quad activation with russian PRN progressing SLR as long as no extension lag, active knee hyperextension equal to opposite side, progress knee flexion to 90 as tolerated, 4-way hip and prone hamstring curls with use of BFR, gait training to no AD as able with good quad control    PT Home Exercise Plan [email or web address]    Consulted and Agree with Plan of Care Patient;Family member/caregiver    Family Member Consulted mother             Patient will benefit from skilled therapeutic intervention in order to improve the following deficits and impairments:  Abnormal gait,Difficulty walking,Decreased safety awareness,Decreased activity tolerance,Decreased strength,Decreased mobility,Increased edema,Pain  Visit Diagnosis: Chronic pain of left knee  Stiffness of left knee, not elsewhere classified  Other abnormalities of gait and mobility  Rupture of anterior cruciate ligament of left knee, subsequent encounter      Problem List There are no problems to display for this patient.   Jeri Cos, SPT 02/28/2021, 11:00 AM  [facility] [address] [place], [place], [address] Phone: [phone]   Fax:  [phone]  Name: Cheryl Cole MRN: [id number] Date of Birth: 2001-01-24

## 2021-02-28 NOTE — Patient Instructions (Signed)
Access Code: [email or web address] URL: [email or web address] Date: 02/28/2021 Prepared by: Jeri Cos  Exercises Long Sitting Quad Set - 2-3 x daily - 7 x weekly - 2 sets - 10 reps - 3-5 seconds hold Supine Heel Slide with Strap - 2-3 x daily - 7 x weekly - 10 reps - 5 hold Long Sitting Calf Stretch with Strap - 2-3 x daily - 7 x weekly - 3 reps - 30 seconds hold Long Sitting Ankle Plantar Flexion with Resistance - 1 x daily - 7 x weekly - 2-3 sets - 20 reps Active Straight Leg Raise with Quad Set - 1 x daily - 7 x weekly - 2 sets - 10 reps Sidelying Hip Abduction - 1 x daily - 7 x weekly - 2 sets - 15 reps Sidelying Hip Adduction - 1 x daily - 7 x weekly - 2 sets - 15 reps Prone Hip Extension - 1 x daily - 7 x weekly - 2 sets - 15 reps Single Leg Stance - 1 x daily - 7 x weekly - 3 sets - 20 sec hold Prone Knee Flexion - 1 x daily - 7 x weekly - 3 sets - 10 reps Standing Heel Raise - 1 x daily - 7 x weekly - 3 sets - 10 reps

## 2021-03-04 ENCOUNTER — Ambulatory Visit: Payer: PRIVATE HEALTH INSURANCE | Attending: Orthopaedic Surgery | Admitting: Physical Therapy

## 2021-03-04 ENCOUNTER — Other Ambulatory Visit: Payer: Self-pay

## 2021-03-04 ENCOUNTER — Encounter: Payer: Self-pay | Admitting: Physical Therapy

## 2021-03-04 DIAGNOSIS — M25662 Stiffness of left knee, not elsewhere classified: Secondary | ICD-10-CM

## 2021-03-04 DIAGNOSIS — S83512D Sprain of anterior cruciate ligament of left knee, subsequent encounter: Secondary | ICD-10-CM

## 2021-03-04 DIAGNOSIS — R2689 Other abnormalities of gait and mobility: Secondary | ICD-10-CM | POA: Diagnosis present

## 2021-03-04 DIAGNOSIS — M25562 Pain in left knee: Secondary | ICD-10-CM | POA: Diagnosis not present

## 2021-03-04 DIAGNOSIS — G8929 Other chronic pain: Secondary | ICD-10-CM

## 2021-03-04 NOTE — Therapy (Signed)
[facility] Outpatient Rehabilitation [facility] [address] [place], [place], [address] Phone: [phone]   Fax:  [phone]  Physical Therapy Treatment  Patient Details  Name: Cheryl Cole MRN: [id number] Date of Birth: 2001-05-05 Referring Provider (PT): DR Ramond Marrow   Encounter Date: 03/04/2021   PT End of Session - 03/04/21 1112    Visit Number 7    Number of Visits 16    Date for PT Re-Evaluation 03/21/21    Authorization Type UHC MCD    Authorization Time Period pending    PT Start Time 1045    PT Stop Time 1130    PT Time Calculation (min) 45 min    Equipment Utilized During Treatment Left knee immobilizer   ambulation   Activity Tolerance Patient tolerated treatment well    Behavior During Therapy WFL for tasks assessed/performed           Past Medical History:  Diagnosis Date  . Torn meniscus     Past Surgical History:  Procedure Laterality Date  . KNEE ARTHROSCOPY WITH ANTERIOR CRUCIATE LIGAMENT (ACL) REPAIR Left 02/05/2021   Procedure: KNEE ARTHROSCOPY WITH ANTERIOR CRUCIATE LIGAMENT (ACL) REPAIR WITH AUTOGRAFT;  Surgeon: Bjorn Pippin, MD;  Location: [facility];  Service: Orthopedics;  Laterality: Left;  . KNEE ARTHROSCOPY WITH LATERAL MENISECTOMY  02/05/2021   Procedure: KNEE ARTHROSCOPY WITH LATERAL MENISECTOMY;  Surgeon: Bjorn Pippin, MD;  Location: [facility];  Service: Orthopedics;;  . KNEE ARTHROSCOPY WITH MEDIAL MENISECTOMY Left 02/05/2021   Procedure: KNEE ARTHROSCOPY WITH  MEDIAL MENISCUS REPAIR;  Surgeon: Bjorn Pippin, MD;  Location: [facility];  Service: Orthopedics;  Laterality: Left;  . NO PAST SURGERIES      There were no vitals filed for this visit.   Subjective Assessment - 03/04/21 1110    Subjective Patient reports she is doing well, she was a little tired walking without crutches first few days but better now.    Patient is accompained by: Family member    Patient Stated  Goals Get back to previous level of activity    Currently in Pain? No/denies              [facility] PT Assessment - 03/04/21 0001      AROM   Left Knee Extension 5   hyper                        [facility] Adult PT Treatment/Exercise - 03/04/21 0001      Blood Flow Restriction   Blood Flow Restriction Yes      Blood Flow Restriction-Positions    Blood Flow Restriction Position Supine      BFR-Supine   Supine Limb Occulsion Pressure (mmHg) 198    Supine Exercise Pressure (mmHg) 158   80%   Supine Exercise Prescription 30,15,15,15, reps w/ 30-60 sec rest    Supine Exercise Prescription Comment SLR      Exercises   Exercises Knee/Hip      Knee/Hip Exercises: Stretches   Quad Stretch 3 reps;30 seconds      Knee/Hip Exercises: Standing   Heel Raises 2 sets;20 reps    SLS 2 x 30 sec on Airex, 2 x 10 pallof press with red    Rebounder SLS 2 x 10, SLS on Airex x 10      Knee/Hip Exercises: Supine   Quad Sets 10 reps   5 sec hold   Quad Sets  Limitations towel under heel for hyper extension    Heel Slides 10 reps    Straight Leg Raises Limitations see BFR      Knee/Hip Exercises: Sidelying   Hip ABduction 2 sets;10 reps    Hip ABduction Limitations 3# arcs      Knee/Hip Exercises: Prone   Hamstring Curl 2 sets;10 reps    Hamstring Curl Limitations 3#    Straight Leg Raises 2 sets;10 reps   cued for TKE   Straight Leg Raises Limitations 3#                  PT Education - 03/04/21 1111    Education Details HEP    Person(s) Educated Patient    Methods Explanation;Demonstration;Verbal cues    Comprehension Verbalized understanding;Returned demonstration;Verbal cues required;Need further instruction            PT Short Term Goals - 03/04/21 1143      PT SHORT TERM GOAL #1   Title Patient will increase knee flexion to 90 degrees    Baseline 90    Time 4    Period Weeks    Status Achieved    Target Date 03/07/21      PT SHORT TERM GOAL #2    Title Patient will demonstrate full extension    Baseline 0 following treatment    Time 4    Period Weeks    Status Achieved    Target Date 03/07/21      PT SHORT TERM GOAL #3   Title Patient will ambualte 300' without a device    Baseline hinge brace donned and 1 axillary crutch    Time 4    Period Weeks    Status On-going    Target Date 03/07/21             PT Long Term Goals - 02/26/21 1124      PT LONG TERM GOAL #1   Title Patient will be indeepdent with exercise progrma to promote quad strength and single leg stability    Time 6    Period Weeks    Status On-going    Target Date 03/21/21      PT LONG TERM GOAL #2   Title Patient will demonstrate a 30 second left single leg stance in order to progress to dynamic single leg strength and stability    Baseline able to maintain 20 sec SLS    Time 6    Period Weeks    Status On-going    Target Date 03/21/21                 Plan - 03/04/21 1144    Clinical Impression Statement Patient tolerated therapy well with no adverse effects. She exhibits good active hyperextension and active flexion of right knee at this point. Patient is able to perform SLR without lag and is ambulating without AD, still with knee immobilizer unlocked to 40. Continued focus on strengthening of hips, hamstring, quad, calves this visit and incorporated BFR for quad SLR with good tolerance. Patient also progressing well with SLS able to perform on unsteady surface this visit. No changes to current HEP this visit. Patient would benefit from continued skilled PT to progress motion, strength, and back to function in order to return to activities such as valleyball.    PT Treatment/Interventions ADLs/Self Care Home Management;Electrical Stimulation;Ultrasound;Gait training;Stair training;Functional mobility training;Therapeutic activities;Therapeutic exercise;Patient/family education;Manual techniques;Passive range of motion;Scar mobilization;Taping     PT Next Visit Plan  Review HEP and progress PRN, hamstring curls, SLS dynamic challenges, patellar mobilizations, continue focus on quad activation/strength, active knee hyperextension equal to opposite side, progress knee flexion to 90 as tolerated, 4-way hip and prone hamstring curls with use of BFR, gait training to no AD as able with good quad control    PT Home Exercise Plan [email or web address]    Consulted and Agree with Plan of Care Patient;Family member/caregiver    Family Member Consulted mother           Patient will benefit from skilled therapeutic intervention in order to improve the following deficits and impairments:  Abnormal gait,Difficulty walking,Decreased safety awareness,Decreased activity tolerance,Decreased strength,Decreased mobility,Increased edema,Pain  Visit Diagnosis: Chronic pain of left knee  Stiffness of left knee, not elsewhere classified  Other abnormalities of gait and mobility  Rupture of anterior cruciate ligament of left knee, subsequent encounter     Problem List There are no problems to display for this patient.   Rosana Hoes, PT, DPT, LAT, ATC 03/04/21  12:17 PM Phone: [phone] Fax: [phone]   [facility] Outpatient Rehabilitation [facility] [address] [place], [place], [address] Phone: [phone]   Fax:  [phone]  Name: Cheryl Cole MRN: [id number] Date of Birth: [birth year]

## 2021-03-06 ENCOUNTER — Ambulatory Visit: Payer: PRIVATE HEALTH INSURANCE | Admitting: Physical Therapy

## 2021-03-06 ENCOUNTER — Other Ambulatory Visit: Payer: Self-pay

## 2021-03-06 ENCOUNTER — Encounter: Payer: Self-pay | Admitting: Physical Therapy

## 2021-03-06 DIAGNOSIS — R2689 Other abnormalities of gait and mobility: Secondary | ICD-10-CM

## 2021-03-06 DIAGNOSIS — M25562 Pain in left knee: Secondary | ICD-10-CM | POA: Diagnosis not present

## 2021-03-06 DIAGNOSIS — G8929 Other chronic pain: Secondary | ICD-10-CM

## 2021-03-06 DIAGNOSIS — M25662 Stiffness of left knee, not elsewhere classified: Secondary | ICD-10-CM

## 2021-03-06 DIAGNOSIS — S83512D Sprain of anterior cruciate ligament of left knee, subsequent encounter: Secondary | ICD-10-CM

## 2021-03-06 NOTE — Therapy (Signed)
[facility] Outpatient Rehabilitation [facility] [address] [place], [place], [address] Phone: [phone]   Fax:  [phone]  Physical Therapy Treatment  Patient Details  Name: Cheryl Cole MRN: [id number] Date of Birth: June 18, 2001 Referring Provider (PT): DR Ramond Marrow   Encounter Date: 03/06/2021   PT End of Session - 03/06/21 0843    Visit Number 8    Number of Visits 16    Date for PT Re-Evaluation 03/21/21    Authorization Type UHC MCD    Authorization Time Period 02/28/21 - 04/01/21    Authorization - Visit Number 3    Authorization - Number of Visits 9    PT Start Time 0833    PT Stop Time 0918    PT Time Calculation (min) 45 min    Equipment Utilized During Treatment Left knee immobilizer    Activity Tolerance Patient tolerated treatment well    Behavior During Therapy [facility] for tasks assessed/performed           Past Medical History:  Diagnosis Date  . Torn meniscus     Past Surgical History:  Procedure Laterality Date  . KNEE ARTHROSCOPY WITH ANTERIOR CRUCIATE LIGAMENT (ACL) REPAIR Left 02/05/2021   Procedure: KNEE ARTHROSCOPY WITH ANTERIOR CRUCIATE LIGAMENT (ACL) REPAIR WITH AUTOGRAFT;  Surgeon: Bjorn Pippin, MD;  Location: [facility];  Service: Orthopedics;  Laterality: Left;  . KNEE ARTHROSCOPY WITH LATERAL MENISECTOMY  02/05/2021   Procedure: KNEE ARTHROSCOPY WITH LATERAL MENISECTOMY;  Surgeon: Bjorn Pippin, MD;  Location: [facility];  Service: Orthopedics;;  . KNEE ARTHROSCOPY WITH MEDIAL MENISECTOMY Left 02/05/2021   Procedure: KNEE ARTHROSCOPY WITH  MEDIAL MENISCUS REPAIR;  Surgeon: Bjorn Pippin, MD;  Location: [facility];  Service: Orthopedics;  Laterality: Left;  . NO PAST SURGERIES      There were no vitals filed for this visit.   Subjective Assessment - 03/06/21 0840    Subjective Patient reports she continues to do well with no new issues.    Patient is accompained by: Family member    mother   Patient Stated Goals Get back to previous level of activity    Currently in Pain? No/denies              [facility] PT Assessment - 03/06/21 0001      AROM   Left Knee Extension 5   hyper   Left Knee Flexion 105                         [facility] Adult PT Treatment/Exercise - 03/06/21 0001      Blood Flow Restriction   Blood Flow Restriction Yes      Blood Flow Restriction-Positions    Blood Flow Restriction Position Supine      BFR-Supine   Supine Limb Occulsion Pressure (mmHg) 198    Supine Exercise Pressure (mmHg) 158   80%   Supine Exercise Prescription 30,15,15,15, reps w/ 30-60 sec rest    Supine Exercise Prescription Comment SLR      Exercises   Exercises Knee/Hip      Knee/Hip Exercises: Stretches   Gastroc Stretch 2 reps;30 seconds    Gastroc Stretch Limitations slant board      Knee/Hip Exercises: Aerobic   Recumbent Bike 5 min with focus on knee flexion ROM   unable to perform full revolutions, brace fully unlocked     Knee/Hip Exercises: Standing   Heel Raises 2 sets;20  reps   2nd set eccentric on left   Rebounder SLS on Airex 3 x 15      Knee/Hip Exercises: Seated   Hamstring Curl 3 sets;10 reps    Hamstring Limitations red band      Knee/Hip Exercises: Supine   Quad Sets 3 sets;10 reps   5 sec hold   Quad Sets Limitations quad set with strap pulling knee into hyper, quad set with towel under heel, quad set focusing on popping heel off table   1 set each   Straight Leg Raises Limitations see BFR      Knee/Hip Exercises: Sidelying   Hip ABduction 3 sets;15 reps    Hip ABduction Limitations red band                  PT Education - 03/06/21 0842    Education Details HEP    Person(s) Educated Patient    Methods Explanation    Comprehension Verbalized understanding;Need further instruction            PT Short Term Goals - 03/06/21 0922      PT SHORT TERM GOAL #1   Title Patient will increase knee flexion to 90  degrees    Baseline 90    Time 4    Period Weeks    Status Achieved    Target Date 03/07/21      PT SHORT TERM GOAL #2   Title Patient will demonstrate full extension    Baseline 0 following treatment    Time 4    Period Weeks    Status Achieved    Target Date 03/07/21      PT SHORT TERM GOAL #3   Title Patient will ambualte 300' without a device    Baseline patient able to ambulate with AD, continues with brace    Time 4    Period Weeks    Status Achieved    Target Date 03/07/21             PT Long Term Goals - 02/26/21 1124      PT LONG TERM GOAL #1   Title Patient will be indeepdent with exercise progrma to promote quad strength and single leg stability    Time 6    Period Weeks    Status On-going    Target Date 03/21/21      PT LONG TERM GOAL #2   Title Patient will demonstrate a 30 second left single leg stance in order to progress to dynamic single leg strength and stability    Baseline able to maintain 20 sec SLS    Time 6    Period Weeks    Status On-going    Target Date 03/21/21                 Plan - 03/06/21 0844    Clinical Impression Statement Patient tolerated therapy well with no adverse effects. Therapy progressed past 90 deg flex this visit and patient able to achieve 105 deg knee flexion prior to report of anterior knee tightness, no pain reported. Patient does exhibit good quad activation but struggles to achieve acitve hyper extension so part of session focused on working into hyper extension and patient did exhibit improvement with increased practice. Continued with BFR and strengthening/balanbce training with good tolerance. She is scheduled to scheduled to see her surgeon later today so will progress in rehab as surgeon allows. Patient would benefit from continued skilled PT to progress motion, strength, and  back to function in order to return to activities such as valleyball.    PT Treatment/Interventions ADLs/Self Care Home  Management;Electrical Stimulation;Ultrasound;Gait training;Stair training;Functional mobility training;Therapeutic activities;Therapeutic exercise;Patient/family education;Manual techniques;Passive range of motion;Scar mobilization;Taping    PT Next Visit Plan Review HEP and progress PRN, hamstring curls, SLS dynamic challenges, patellar mobilizations, continue focus on quad activation/strength, active knee hyperextension equal to opposite side, progress knee flexion to 90 as tolerated, 4-way hip and prone hamstring curls with use of BFR, gait training to no AD as able with good quad control    PT Home Exercise Plan [email or web address]    Consulted and Agree with Plan of Care Patient;Family member/caregiver           Patient will benefit from skilled therapeutic intervention in order to improve the following deficits and impairments:  Abnormal gait,Difficulty walking,Decreased safety awareness,Decreased activity tolerance,Decreased strength,Decreased mobility,Increased edema,Pain  Visit Diagnosis: Chronic pain of left knee  Stiffness of left knee, not elsewhere classified  Other abnormalities of gait and mobility  Rupture of anterior cruciate ligament of left knee, subsequent encounter     Problem List There are no problems to display for this patient.   Rosana Hoes, PT, DPT, LAT, ATC 03/06/21  9:40 AM Phone: [phone] Fax: [phone]   [facility] Outpatient Rehabilitation [facility] [address] [place], [place], [address] Phone: [phone]   Fax:  [phone]  Name: Cheryl Cole MRN: [id number] Date of Birth: 08-20-2001

## 2021-03-10 ENCOUNTER — Ambulatory Visit: Payer: PRIVATE HEALTH INSURANCE

## 2021-03-10 ENCOUNTER — Other Ambulatory Visit: Payer: Self-pay

## 2021-03-10 DIAGNOSIS — R2689 Other abnormalities of gait and mobility: Secondary | ICD-10-CM

## 2021-03-10 DIAGNOSIS — M25562 Pain in left knee: Secondary | ICD-10-CM

## 2021-03-10 DIAGNOSIS — M25662 Stiffness of left knee, not elsewhere classified: Secondary | ICD-10-CM

## 2021-03-10 DIAGNOSIS — S83512D Sprain of anterior cruciate ligament of left knee, subsequent encounter: Secondary | ICD-10-CM

## 2021-03-10 DIAGNOSIS — G8929 Other chronic pain: Secondary | ICD-10-CM

## 2021-03-10 NOTE — Therapy (Signed)
[facility] Outpatient Rehabilitation [facility] [address] [place], [place], [address] Phone: [phone]   Fax:  [phone]  Physical Therapy Treatment  Patient Details  Name: Cheryl Cole. Krus MRN: [id number] Date of Birth: 02-26-2001 Referring Provider (PT): DR Ramond Marrow   Encounter Date: 03/10/2021   PT End of Session - 03/10/21 1329    Visit Number 9    Number of Visits 16    Date for PT Re-Evaluation 03/21/21    Authorization Type [employer] MCD    Authorization Time Period 02/28/21 - 04/01/21    Authorization - Visit Number 4    Authorization - Number of Visits 9    PT Start Time 1330    PT Stop Time 1415    PT Time Calculation (min) 45 min    Equipment Utilized During Treatment --    Activity Tolerance Patient tolerated treatment well    Behavior During Therapy WFL for tasks assessed/performed           Past Medical History:  Diagnosis Date  . Torn meniscus     Past Surgical History:  Procedure Laterality Date  . KNEE ARTHROSCOPY WITH ANTERIOR CRUCIATE LIGAMENT (ACL) REPAIR Left 02/05/2021   Procedure: KNEE ARTHROSCOPY WITH ANTERIOR CRUCIATE LIGAMENT (ACL) REPAIR WITH AUTOGRAFT;  Surgeon: Bjorn Pippin, MD;  Location: [facility];  Service: Orthopedics;  Laterality: Left;  . KNEE ARTHROSCOPY WITH LATERAL MENISECTOMY  02/05/2021   Procedure: KNEE ARTHROSCOPY WITH LATERAL MENISECTOMY;  Surgeon: Bjorn Pippin, MD;  Location: [facility];  Service: Orthopedics;;  . KNEE ARTHROSCOPY WITH MEDIAL MENISECTOMY Left 02/05/2021   Procedure: KNEE ARTHROSCOPY WITH  MEDIAL MENISCUS REPAIR;  Surgeon: Bjorn Pippin, MD;  Location: [facility];  Service: Orthopedics;  Laterality: Left;  . NO PAST SURGERIES      There were no vitals filed for this visit.   Subjective Assessment - 03/10/21 1332    Subjective She had f/u with Dr. Everardo Pacific last week and was instructed that she could discontinue use of brace as tolerated. She still has  the brace locked at 90, but has occasionally gone without the brace at home. She has next f/u with Dr. Everardo Pacific on 04/04/21.    Patient is accompained by: Family member   mother   Patient Stated Goals Get back to previous level of activity    Currently in Pain? No/denies              [facility] PT Assessment - 03/10/21 0001      AROM   Left Knee Flexion 105                         [facility] Adult PT Treatment/Exercise - 03/10/21 0001      Ambulation/Gait   Ambulation Distance (Feet) 50 Feet   x2; focusing on allowing for knee flexion throughout gait cycle   Assistive device None    Gait Comments pregait activity: forward hurdle step overs 1 x 10 each      Self-Care   Other Self-Care Comments  see patient education      Neuro Re-ed    Neuro Re-ed Details  tandem eyes closed 3 x 30 sec each, marching on airex 2 x 10, SL balance iwth tennis ball bounce 1 x 10 each      Knee/Hip Exercises: Stretches   Gastroc Stretch 60 seconds    Gastroc Stretch Limitations slant board    Other Knee/Hip Stretches  single knee to chest 1 x 10 Lt      Knee/Hip Exercises: Aerobic   Recumbent Bike 5 min no resistance bwd and fwd full revolutions      Knee/Hip Exercises: Standing   Heel Raises 10 reps    Heel Raises Limitations x2; single leg    Forward Step Up 10 reps    Forward Step Up Limitations 2 inch step    Functional Squat 10 reps    Functional Squat Limitations x2; mini squat with UE support      Knee/Hip Exercises: Supine   Bridges 15 reps    Bridges Limitations x2 on stability ball                  PT Education - 03/10/21 1338    Education Details recommendation to discontinue use of brace for household ambulation and continue use for community ambulation at this time. brace unlocked to full flexion. updated HEP. continue use of ice to reduce swelling    Person(s) Educated Patient;Parent(s)    Methods Explanation;Demonstration;Verbal cues;Handout    Comprehension  Verbalized understanding;Returned demonstration            PT Short Term Goals - 03/06/21 0922      PT SHORT TERM GOAL #1   Title Patient will increase knee flexion to 90 degrees    Baseline 90    Time 4    Period Weeks    Status Achieved    Target Date 03/07/21      PT SHORT TERM GOAL #2   Title Patient will demonstrate full extension    Baseline 0 following treatment    Time 4    Period Weeks    Status Achieved    Target Date 03/07/21      PT SHORT TERM GOAL #3   Title Patient will ambualte 300' without a device    Baseline patient able to ambulate with AD, continues with brace    Time 4    Period Weeks    Status Achieved    Target Date 03/07/21             PT Long Term Goals - 02/26/21 1124      PT LONG TERM GOAL #1   Title Patient will be indeepdent with exercise progrma to promote quad strength and single leg stability    Time 6    Period Weeks    Status On-going    Target Date 03/21/21      PT LONG TERM GOAL #2   Title Patient will demonstrate a 30 second left single leg stance in order to progress to dynamic single leg strength and stability    Baseline able to maintain 20 sec SLS    Time 6    Period Weeks    Status On-going    Target Date 03/21/21                 Plan - 03/10/21 1339    Clinical Impression Statement Patient was cleared by Dr. Everardo Pacific at her appointment last week to discontinue use of brace. She has kept the brace locked at 90 degrees and only gone without it occasionally during household ambulation since her appointment with Dr. Everardo Pacific. It was recommended that she discontinue use of brace at home, but continue to wear during community ambulation at this time. She was able to complete full forward revolutions on the bike today reporting mild soreness in Lt posterior knee. Gait training without knee immobilizer focused  on allowing for further Rt knee flexion during gait cycle with slight improvements noted following forward step  overs. Able to introduce CKC activity with patient demonstrating good form and no reports of pain. She is challenged with tandem stance with eyes closed requiring ocassional use of UE support to maintain balance.    PT Treatment/Interventions ADLs/Self Care Home Management;Electrical Stimulation;Ultrasound;Gait training;Stair training;Functional mobility training;Therapeutic activities;Therapeutic exercise;Patient/family education;Manual techniques;Passive range of motion;Scar mobilization;Taping    PT Next Visit Plan progress HEP prn. progress CKC strengthening as tolerated. gait training without brace focusing on knee flexion. dynamic balance activity    PT Home Exercise Plan [email or web address]    Consulted and Agree with Plan of Care Patient;Family member/caregiver    Family Member Consulted mother           Patient will benefit from skilled therapeutic intervention in order to improve the following deficits and impairments:  Abnormal gait,Difficulty walking,Decreased safety awareness,Decreased activity tolerance,Decreased strength,Decreased mobility,Increased edema,Pain  Visit Diagnosis: Chronic pain of left knee  Stiffness of left knee, not elsewhere classified  Other abnormalities of gait and mobility  Rupture of anterior cruciate ligament of left knee, subsequent encounter     Problem List There are no problems to display for this patient.  Letitia Libra, PT, DPT, ATC 03/10/21 2:33 PM  [facility] Outpatient Rehabilitation [facility] [address] [place], [place], [address] Phone: [phone]   Fax:  [phone]  Name: Cheryl Cole. Rheaume MRN: [id number] Date of Birth: [birth year]

## 2021-03-12 ENCOUNTER — Encounter: Payer: Self-pay | Admitting: Physical Therapy

## 2021-03-12 ENCOUNTER — Ambulatory Visit: Payer: PRIVATE HEALTH INSURANCE | Admitting: Physical Therapy

## 2021-03-12 ENCOUNTER — Other Ambulatory Visit: Payer: Self-pay

## 2021-03-12 DIAGNOSIS — G8929 Other chronic pain: Secondary | ICD-10-CM

## 2021-03-12 DIAGNOSIS — M25562 Pain in left knee: Secondary | ICD-10-CM | POA: Diagnosis not present

## 2021-03-12 DIAGNOSIS — R2689 Other abnormalities of gait and mobility: Secondary | ICD-10-CM

## 2021-03-12 DIAGNOSIS — S83512D Sprain of anterior cruciate ligament of left knee, subsequent encounter: Secondary | ICD-10-CM

## 2021-03-12 DIAGNOSIS — M25662 Stiffness of left knee, not elsewhere classified: Secondary | ICD-10-CM

## 2021-03-12 NOTE — Patient Instructions (Signed)
Access Code: [email or web address] URL: [email or web address] Date: 03/12/2021 Prepared by: Rosana Hoes  Exercises Supine Heel Slide with Strap - 2-3 x daily - 7 x weekly - 10 reps - 5 hold Active Straight Leg Raise with Quad Set - 1 x daily - 7 x weekly - 2 sets - 10 reps Sidelying Hip Abduction - 1 x daily - 7 x weekly - 2 sets - 15 reps Prone Knee Flexion - 1 x daily - 7 x weekly - 3 sets - 10 reps Single Leg Heel Raise with Counter Support - 1 x daily - 7 x weekly - 2 sets - 10 reps Single Leg Stance - 1 x daily - 7 x weekly - 3 sets - 30 sec hold Tandem Stance with Eyes Closed - 1 x daily - 7 x weekly - 3 sets - 30 sec hold Hook Lying Single Knee to Chest Stretch with Towel - 1 x daily - 7 x weekly - 2 sets - 10 reps Gastroc Stretch on Wall - 1 x daily - 7 x weekly - 2 reps - 30 hold Soleus Stretch on Wall - 1 x daily - 7 x weekly - 2 reps - 30 hold Wall Quarter Squat - 1 x daily - 7 x weekly - 2 sets - 10 reps Single Leg Bridge - 1 x daily - 7 x weekly - 2 sets - 10 reps Standing Terminal Knee Extension with Resistance - 1 x daily - 7 x weekly - 2 sets - 10 reps

## 2021-03-12 NOTE — Therapy (Signed)
[facility] Outpatient Rehabilitation [facility] [address] [place], [place], [address] Phone: [phone]   Fax:  [phone]  Physical Therapy Treatment  Patient Details  Name: Cheryl Cole MRN: [id number] Date of Birth: 2001-01-15 Referring Provider (PT): DR Ramond Marrow   Encounter Date: 03/12/2021   PT End of Session - 03/12/21 1535    Visit Number 10    Number of Visits 16    Date for PT Re-Evaluation 03/21/21    Authorization Type U[employer] MCD    Authorization Time Period 02/28/21 - 04/01/21    Authorization - Visit Number 5    Authorization - Number of Visits 9    PT Start Time 1530    PT Stop Time 1615    PT Time Calculation (min) 45 min    Activity Tolerance Patient tolerated treatment well    Behavior During Therapy [facility] for tasks assessed/performed           Past Medical History:  Diagnosis Date  . Torn meniscus     Past Surgical History:  Procedure Laterality Date  . KNEE ARTHROSCOPY WITH ANTERIOR CRUCIATE LIGAMENT (ACL) REPAIR Left 02/05/2021   Procedure: KNEE ARTHROSCOPY WITH ANTERIOR CRUCIATE LIGAMENT (ACL) REPAIR WITH AUTOGRAFT;  Surgeon: Bjorn Pippin, MD;  Location: [facility];  Service: Orthopedics;  Laterality: Left;  . KNEE ARTHROSCOPY WITH LATERAL MENISECTOMY  02/05/2021   Procedure: KNEE ARTHROSCOPY WITH LATERAL MENISECTOMY;  Surgeon: Bjorn Pippin, MD;  Location: [facility];  Service: Orthopedics;;  . KNEE ARTHROSCOPY WITH MEDIAL MENISECTOMY Left 02/05/2021   Procedure: KNEE ARTHROSCOPY WITH  MEDIAL MENISCUS REPAIR;  Surgeon: Bjorn Pippin, MD;  Location: [facility];  Service: Orthopedics;  Laterality: Left;  . NO PAST SURGERIES      There were no vitals filed for this visit.   Subjective Assessment - 03/12/21 1533    Subjective Patient reports she is doing well, she has stopped using the brace at home and is feeling more comfortable. Still having some tightness with knee bending.    Patient  Stated Goals Get back to previous level of activity    Currently in Pain? No/denies              [facility] PT Assessment - 03/12/21 0001      AROM   Left Knee Flexion 110                         [facility] Adult PT Treatment/Exercise - 03/12/21 0001      Exercises   Exercises Knee/Hip      Knee/Hip Exercises: Stretches   Gastroc Stretch 2 reps;30 seconds   counter   Soleus Stretch 2 reps;30 seconds   counter     Knee/Hip Exercises: Aerobic   Recumbent Bike 5 min fwd full revolutions for ROM      Knee/Hip Exercises: Standing   Heel Raises 2 sets;10 reps    Heel Raises Limitations single leg    Terminal Knee Extension 10 reps   3 sec hold   Theraband Level (Terminal Knee Extension) Level 2 (Red)    Step Down 2 sets;5 reps    Step Down Limitations lateral heel tap on 2" box    Functional Squat 2 sets;10 reps   2nd set performed with red band TKE   Functional Squat Limitations squat to elevated mat table, cues to keep weight centered, allow knees to come forward    Wall Squat 10  reps    Wall Squat Limitations mini-squat, focus on keeping weight centered, knee positioning      Knee/Hip Exercises: Supine   Heel Slides 10 reps    Bridges 10 reps   5 sec hold   Single Leg Bridge 10 reps    Straight Leg Raises 10 reps                  PT Education - 03/12/21 1534    Education Details HEP update    Person(s) Educated Patient    Methods Explanation;Demonstration;Tactile cues;Verbal cues;Handout    Comprehension Verbalized understanding;Need further instruction;Returned demonstration;Verbal cues required;Tactile cues required            PT Short Term Goals - 03/06/21 0922      PT SHORT TERM GOAL #1   Title Patient will increase knee flexion to 90 degrees    Baseline 90    Time 4    Period Weeks    Status Achieved    Target Date 03/07/21      PT SHORT TERM GOAL #2   Title Patient will demonstrate full extension    Baseline 0 following treatment     Time 4    Period Weeks    Status Achieved    Target Date 03/07/21      PT SHORT TERM GOAL #3   Title Patient will ambualte 300' without a device    Baseline patient able to ambulate with AD, continues with brace    Time 4    Period Weeks    Status Achieved    Target Date 03/07/21             PT Long Term Goals - 02/26/21 1124      PT LONG TERM GOAL #1   Title Patient will be indeepdent with exercise progrma to promote quad strength and single leg stability    Time 6    Period Weeks    Status On-going    Target Date 03/21/21      PT LONG TERM GOAL #2   Title Patient will demonstrate a 30 second left single leg stance in order to progress to dynamic single leg strength and stability    Baseline able to maintain 20 sec SLS    Time 6    Period Weeks    Status On-going    Target Date 03/21/21                 Plan - 03/12/21 1535    Clinical Impression Statement Patient tolerated therapy well with no adverse effects. Therapy progress CKC strengthening this visit with good tolerance. She exhibits weight shift toward right with squats to performed on wall with better control. She does have difficulty with TKE in standing and quad fatigues quickly. HEP progressed this visit with good tolerance. Patient would benefit from continued skilled PT to progress motion, strength, and back to function in order to return to activities such as valleyball.    PT Treatment/Interventions ADLs/Self Care Home Management;Electrical Stimulation;Ultrasound;Gait training;Stair training;Functional mobility training;Therapeutic activities;Therapeutic exercise;Patient/family education;Manual techniques;Passive range of motion;Scar mobilization;Taping    PT Next Visit Plan Progress HEP, quad stretch/knee flexion ROM, progress CKC strengthening as tolerated, gait training without brace focusing on knee flexion. dynamic balance activity    PT Home Exercise Plan [email or web address]    Consulted and Agree with Plan  of Care Patient;Family member/caregiver    Family Member Consulted mother           Patient  will benefit from skilled therapeutic intervention in order to improve the following deficits and impairments:  Abnormal gait,Difficulty walking,Decreased safety awareness,Decreased activity tolerance,Decreased strength,Decreased mobility,Increased edema,Pain  Visit Diagnosis: Chronic pain of left knee  Stiffness of left knee, not elsewhere classified  Other abnormalities of gait and mobility  Rupture of anterior cruciate ligament of left knee, subsequent encounter     Problem List There are no problems to display for this patient.   Rosana Hoes, PT, DPT, LAT, ATC 03/12/21  4:25 PM Phone: [phone] Fax: [phone]   [facility] Outpatient Rehabilitation [facility] [address] [place], [place], [address] Phone: [phone]   Fax:  [phone]  Name: Cheryl Cole MRN: [id number] Date of Birth: 01/18/2001

## 2021-03-17 ENCOUNTER — Other Ambulatory Visit: Payer: Self-pay

## 2021-03-17 ENCOUNTER — Ambulatory Visit: Payer: PRIVATE HEALTH INSURANCE | Admitting: Physical Therapy

## 2021-03-17 ENCOUNTER — Encounter: Payer: Self-pay | Admitting: Physical Therapy

## 2021-03-17 DIAGNOSIS — M25562 Pain in left knee: Secondary | ICD-10-CM | POA: Diagnosis not present

## 2021-03-17 DIAGNOSIS — G8929 Other chronic pain: Secondary | ICD-10-CM

## 2021-03-17 DIAGNOSIS — S83512D Sprain of anterior cruciate ligament of left knee, subsequent encounter: Secondary | ICD-10-CM

## 2021-03-17 DIAGNOSIS — M25662 Stiffness of left knee, not elsewhere classified: Secondary | ICD-10-CM

## 2021-03-17 DIAGNOSIS — R2689 Other abnormalities of gait and mobility: Secondary | ICD-10-CM

## 2021-03-17 NOTE — Patient Instructions (Signed)
Access Code: [email or web address] URL: [email or web address] Date: 03/17/2021 Prepared by: Rosana Hoes  Exercises Prone Quadriceps Stretch with Strap - 2-3 x daily - 7 x weekly - 2 reps - 30 hold Supine Heel Slide with Strap - 2-3 x daily - 7 x weekly - 10 reps - 5 hold Long Sitting Quad Set - 2-3 x daily - 7 x weekly - 10 reps Active Straight Leg Raise with Quad Set - 1 x daily - 7 x weekly - 2 sets - 10 reps Sidelying Hip Abduction - 1 x daily - 7 x weekly - 2 sets - 15 reps Single Leg Heel Raise with Counter Support - 1 x daily - 7 x weekly - 2 sets - 10 reps Hook Lying Single Knee to Chest Stretch with Towel - 1 x daily - 7 x weekly - 2 sets - 10 reps Seated Hamstring Curl with Anchored Resistance - 1 x daily - 7 x weekly - 3 sets - 10 reps Gastroc Stretch on Wall - 1 x daily - 7 x weekly - 2 reps - 30 hold Soleus Stretch on Wall - 1 x daily - 7 x weekly - 2 reps - 30 hold Wall Quarter Squat - 1 x daily - 7 x weekly - 2 sets - 10 reps Single Leg Bridge - 1 x daily - 7 x weekly - 2 sets - 10 reps Standing Terminal Knee Extension with Resistance - 1 x daily - 7 x weekly - 2 sets - 10 reps Mini Lunge - 1 x daily - 7 x weekly - 2 sets - 10 reps Single Leg Stance - 1 x daily - 7 x weekly - 3 sets - 30 sec hold Tandem Stance with Eyes Closed - 1 x daily - 7 x weekly - 3 sets - 30 sec hold Single Leg Cone Touch - 1 x daily - 7 x weekly - 3 sets - 30 hold

## 2021-03-17 NOTE — Therapy (Signed)
[facility] Rehabilitation [facility] [address] [place], [place], [address] Phone: [phone]   Fax:  [phone]  Physical Therapy Treatment  Patient Details  Name: Cheryl Cole MRN: [id number] Date of Birth: [birth year] Referring Provider (PT): DR Ramond Marrow   Encounter Date: 03/17/2021   PT End of Session - 03/17/21 1017    Visit Number 11    Number of Visits 16    Date for PT Re-Evaluation 03/21/21    Authorization Type UHC MCD    Authorization Time Period 02/28/21 - 04/01/21    Authorization - Visit Number 6    Authorization - Number of Visits 9    PT Start Time 0915    PT Stop Time 1000    PT Time Calculation (min) 45 min    Activity Tolerance Patient tolerated treatment well    Behavior During Therapy [facility] for tasks assessed/performed           Past Medical History:  Diagnosis Date  . Torn meniscus     Past Surgical History:  Procedure Laterality Date  . KNEE ARTHROSCOPY WITH ANTERIOR CRUCIATE LIGAMENT (ACL) REPAIR Left 02/05/2021   Procedure: KNEE ARTHROSCOPY WITH ANTERIOR CRUCIATE LIGAMENT (ACL) REPAIR WITH AUTOGRAFT;  Surgeon: Bjorn Pippin, MD;  Location: [facility];  Service: Orthopedics;  Laterality: Left;  . KNEE ARTHROSCOPY WITH LATERAL MENISECTOMY  02/05/2021   Procedure: KNEE ARTHROSCOPY WITH LATERAL MENISECTOMY;  Surgeon: Bjorn Pippin, MD;  Location: [facility];  Service: Orthopedics;;  . KNEE ARTHROSCOPY WITH MEDIAL MENISECTOMY Left 02/05/2021   Procedure: KNEE ARTHROSCOPY WITH  MEDIAL MENISCUS REPAIR;  Surgeon: Bjorn Pippin, MD;  Location: [facility];  Service: Orthopedics;  Laterality: Left;  . NO PAST SURGERIES      There were no vitals filed for this visit.   Subjective Assessment - 03/17/21 0923    Subjective Patient reports she is doing well, some of the exercises are getting easy at home. She did notices some swelling so took a rest day.    Patient Stated Goals Get back  to previous level of activity    Currently in Pain? No/denies              [facility] PT Assessment - 03/17/21 0001      AROM   Left Knee Flexion 118                         [facility] Adult PT Treatment/Exercise - 03/17/21 0001      Neuro Re-ed    Neuro Re-ed Details  SL balance on Airx with ball toss 2 x 15 each; SL balance with foward cone taps 2 x 10   cones placed on 6" box     Exercises   Exercises Knee/Hip      Knee/Hip Exercises: Stretches   Lobbyist 3 reps;30 seconds    Quad Stretch Limitations prone with strap      Knee/Hip Exercises: Aerobic   Recumbent Bike 5 min fwd full revolutions for ROM      Knee/Hip Exercises: Standing   Forward Lunges 2 sets;10 reps   focus on allowing knee to translate forward for improve quad control   Forward Lunges Limitations stationary lunge with left forward    Forward Step Up 10 reps   patient with difficulty eccentric lowering   Forward Step Up Limitations 6" box with TRX support    Step Down 2 sets;10 reps  Step Down Limitations lateral heel tap on 4" box    Functional Squat 2 sets;10 reps    Functional Squat Limitations squat to elevated mat table, cues to keep weight centered, allow knees to come forward, mirror for visual feedback      Knee/Hip Exercises: Seated   Hamstring Curl 2 sets;10 reps    Hamstring Limitations red band      Knee/Hip Exercises: Supine   Quad Sets 10 reps   5 sec hold   Quad Sets Limitations towel under hel    Terminal Knee Extension 2 sets;5 reps    Terminal Knee Extension Limitations longistting quad set with strap around foot to pull knee into hyperextension, then controlled lowering using quad                  PT Education - 03/17/21 1016    Education Details HEP update, importance of ATKHE and quad control, wean completely from brace    Person(s) Educated Patient;Parent(s)    Methods Explanation;Demonstration;Verbal cues;Handout;Tactile cues    Comprehension  Verbalized understanding;Returned demonstration;Verbal cues required;Tactile cues required;Need further instruction            PT Short Term Goals - 03/06/21 0922      PT SHORT TERM GOAL #1   Title Patient will increase knee flexion to 90 degrees    Baseline 90    Time 4    Period Weeks    Status Achieved    Target Date 03/07/21      PT SHORT TERM GOAL #2   Title Patient will demonstrate full extension    Baseline 0 following treatment    Time 4    Period Weeks    Status Achieved    Target Date 03/07/21      PT SHORT TERM GOAL #3   Title Patient will ambualte 300' without a device    Baseline patient able to ambulate with AD, continues with brace    Time 4    Period Weeks    Status Achieved    Target Date 03/07/21             PT Long Term Goals - 02/26/21 1124      PT LONG TERM GOAL #1   Title Patient will be indeepdent with exercise progrma to promote quad strength and single leg stability    Time 6    Period Weeks    Status On-going    Target Date 03/21/21      PT LONG TERM GOAL #2   Title Patient will demonstrate a 30 second left single leg stance in order to progress to dynamic single leg strength and stability    Baseline able to maintain 20 sec SLS    Time 6    Period Weeks    Status On-going    Target Date 03/21/21                 Plan - 03/17/21 1010    Clinical Impression Statement Patient tolerated therapy well with no adverse effects. Therapy focused on continued strengthening for quad/hips and progressng dynamic balance. Patient did exhibit continued difficulty with active terminal knee hyperextenson so incorporated this back into HEP. She also has difficulty with eccentric knee control and letting knee translate forward to utilize quad so began stationary lunges and step ups to work on this. Used mirror for visual feedback to improve squat mechanics. Updated HEP this visit with good tolerance. Patient would benefit from continued skilled PT  to progress motion, strength, and back to function in order to return to activities such as valleyball.    PT Treatment/Interventions ADLs/Self Care Home Management;Electrical Stimulation;Ultrasound;Gait training;Stair training;Functional mobility training;Therapeutic activities;Therapeutic exercise;Patient/family education;Manual techniques;Passive range of motion;Scar mobilization;Taping    PT Next Visit Plan Progress HEP, work on ATKHE, quad stretch/knee flexion ROM, progress CKC strengthening as tolerated, dynamic balance activity    PT Home Exercise Plan [email or web address]    Consulted and Agree with Plan of Care Patient;Family member/caregiver    Family Member Consulted mother           Patient will benefit from skilled therapeutic intervention in order to improve the following deficits and impairments:  Abnormal gait,Difficulty walking,Decreased safety awareness,Decreased activity tolerance,Decreased strength,Decreased mobility,Increased edema,Pain  Visit Diagnosis: Chronic pain of left knee  Stiffness of left knee, not elsewhere classified  Other abnormalities of gait and mobility  Rupture of anterior cruciate ligament of left knee, subsequent encounter     Problem List There are no problems to display for this patient.   Rosana Hoes, PT, DPT, LAT, ATC 03/17/21  10:24 AM Phone: [phone] Fax: [phone]   [facility] Outpatient Rehabilitation [facility] [address] [place], [place], [address] Phone: [phone]   Fax:  [phone]  Name: Cheryl Cole MRN: [id number] Date of Birth: 2001/01/09

## 2021-03-19 ENCOUNTER — Encounter: Payer: Self-pay | Admitting: Physical Therapy

## 2021-03-19 ENCOUNTER — Ambulatory Visit: Payer: PRIVATE HEALTH INSURANCE | Admitting: Physical Therapy

## 2021-03-19 ENCOUNTER — Other Ambulatory Visit: Payer: Self-pay

## 2021-03-19 DIAGNOSIS — R2689 Other abnormalities of gait and mobility: Secondary | ICD-10-CM

## 2021-03-19 DIAGNOSIS — G8929 Other chronic pain: Secondary | ICD-10-CM

## 2021-03-19 DIAGNOSIS — S83512D Sprain of anterior cruciate ligament of left knee, subsequent encounter: Secondary | ICD-10-CM

## 2021-03-19 DIAGNOSIS — M25562 Pain in left knee: Secondary | ICD-10-CM | POA: Diagnosis not present

## 2021-03-19 DIAGNOSIS — M25662 Stiffness of left knee, not elsewhere classified: Secondary | ICD-10-CM

## 2021-03-19 NOTE — Patient Instructions (Signed)
Access Code: [email or web address] URL: [email or web address] Date: 03/19/2021 Prepared by: Rosana Hoes  Exercises Prone Quadriceps Stretch with Strap - 2-3 x daily - 7 x weekly - 2 reps - 30 hold Supine Heel Slide with Strap - 2-3 x daily - 7 x weekly - 10 reps - 5 hold Long Sitting Quad Set - 2-3 x daily - 7 x weekly - 10 reps Active Straight Leg Raise with Quad Set - 1 x daily - 7 x weekly - 2 sets - 10 reps Hook Lying Single Knee to Chest Stretch with Towel - 1 x daily - 7 x weekly - 2 sets - 10 reps Seated Hamstring Curl with Anchored Resistance - 1 x daily - 7 x weekly - 3 sets - 10 reps Gastroc Stretch on Wall - 1 x daily - 7 x weekly - 2 reps - 30 hold Soleus Stretch on Wall - 1 x daily - 7 x weekly - 2 reps - 30 hold Wall Quarter Squat - 1 x daily - 7 x weekly - 2 sets - 10 reps Single Leg Bridge - 1 x daily - 7 x weekly - 2 sets - 10 reps Single Leg Heel Raise with Counter Support - 1 x daily - 7 x weekly - 2 sets - 10 reps Standing Terminal Knee Extension with Resistance - 1 x daily - 7 x weekly - 2 sets - 10 reps Mini Lunge - 1 x daily - 7 x weekly - 2 sets - 10 reps Single Leg Stance - 1 x daily - 7 x weekly - 3 sets - 30 sec hold Tandem Stance with Eyes Closed - 1 x daily - 7 x weekly - 3 sets - 30 sec hold Single Leg Cone Touch - 1 x daily - 7 x weekly - 3 sets - 30 hold Side Stepping with Resistance at Thighs - 1 x daily - 7 x weekly - 3 sets - 20 reps

## 2021-03-20 ENCOUNTER — Encounter: Payer: Self-pay | Admitting: Physical Therapy

## 2021-03-20 NOTE — Therapy (Signed)
[facility] [address] [place], [place], [address] Phone: [phone]   Fax:  [phone]  Physical Therapy Treatment / ERO  Patient Details  Name: Cheryl BrazenBrenda A. Tiburcio PeaHarris MRN: [id number] Date of Birth: 2000-12-16 Referring Provider (PT): DR Ramond Marrowax Cole   Encounter Date: 03/19/2021   PT End of Session - 03/19/21 0705    Visit Number 12    Number of Visits 24    Date for PT Re-Evaluation 04/30/21    Authorization Type UHC MCD    Authorization Time Period 02/28/21 - 04/01/21    Authorization - Visit Number 7    Authorization - Number of Visits 9    PT Start Time 0658    PT Stop Time 0745    PT Time Calculation (min) 47 min    Activity Tolerance Patient tolerated treatment well    Behavior During Therapy [facility] for tasks assessed/performed           Past Medical History:  Diagnosis Date  . Torn meniscus     Past Surgical History:  Procedure Laterality Date  . KNEE ARTHROSCOPY WITH ANTERIOR CRUCIATE LIGAMENT (ACL) REPAIR Left 02/05/2021   Procedure: KNEE ARTHROSCOPY WITH ANTERIOR CRUCIATE LIGAMENT (ACL) REPAIR WITH AUTOGRAFT;  Surgeon: Bjorn PippinVarkey, Cheryl T, MD;  Location: [facility];  Service: Orthopedics;  Laterality: Left;  . KNEE ARTHROSCOPY WITH LATERAL MENISECTOMY  02/05/2021   Procedure: KNEE ARTHROSCOPY WITH LATERAL MENISECTOMY;  Surgeon: Bjorn PippinVarkey, Cheryl T, MD;  Location: [facility];  Service: Orthopedics;;  . KNEE ARTHROSCOPY WITH MEDIAL MENISECTOMY Left 02/05/2021   Procedure: KNEE ARTHROSCOPY WITH  MEDIAL MENISCUS REPAIR;  Surgeon: Bjorn PippinVarkey, Cheryl T, MD;  Location: [facility];  Service: Orthopedics;  Laterality: Left;  . NO PAST SURGERIES      There were no vitals filed for this visit.   Subjective Assessment - 03/19/21 0704    Subjective Patient reports she is a little stiff this morning but no pain. She has been consistent with her exercises at home, they are getting a little easier.    Patient  Stated Goals Get back to previous level of activity    Currently in Pain? No/denies              [facility] - 03/20/21 0001      Assessment   Medical Diagnosis L ACL with BTB graft and Medial meniscal reapair    Referring Provider (PT) DR Ramond Marrowax Cole    Onset Date/Surgical Date 02/05/21      Precautions   Precautions None      Restrictions   Weight Bearing Restrictions No      Balance Screen   Has the patient fallen in the past 6 months No      Prior Function   Level of Independence Independent    Leisure Plays Volleyball      Observation/Other Assessments   Focus on Therapeutic Outcomes (FOTO)  NA - MCD      Functional Tests   Functional tests Single leg stance      Single Leg Stance   Comments Patien table to maintain SLS > 30 sec on left      AROM   Left Knee Flexion 120      Strength   Strength Assessment Site Hip;Knee    Right/Left Hip Right;Left    Right Hip Flexion 4/5    Right Hip Extension 4/5    Right Hip ABduction 4/5    Left Hip Flexion  4-/5    Left Hip Extension 4-/5    Left Hip ABduction 4-/5    Right/Left Knee Right;Left    Right Knee Flexion 5/5    Right Knee Extension 5/5    Left Knee Flexion 4-/5    Left Knee Extension 4-/5      Ambulation/Gait   Gait Comments Patient continues to demonstrate slight antalgic gait on left side, no AD or knee immobilizer                         [facility] Adult PT Treatment/Exercise - 03/20/21 0001      Exercises   Exercises Knee/Hip      Knee/Hip Exercises: Stretches   Passive Hamstring Stretch 3 reps;30 seconds    Passive Hamstring Stretch Limitations supine with strap    Quad Stretch 3 reps;30 seconds    Quad Stretch Limitations prone with strap    Gastroc Stretch 3 reps;30 seconds    Gastroc Stretch Limitations slant board      Knee/Hip Exercises: Aerobic   Recumbent Bike 5 min fwd full revolutions for ROM      Knee/Hip Exercises: Machines for Strengthening   Cybex Leg  Press DL: 54# x 10, 09# x 10, 81# 2 x 10; SL: left 20# 3 x 6, right 60# 3 x 8   feet slight staggered with DL so left lower     Knee/Hip Exercises: Standing   Forward Lunges 2 sets;5 reps    Forward Lunges Limitations reverse lunge using TRX, focus on allowing knee to move forward to utilize quad    SLS 30 sec    Other Standing Knee Exercises Lateral band walk with green around knees 3 x 20      Knee/Hip Exercises: Seated   Other Seated Knee/Hip Exercises Demonstrated quad set seated edge of chair to perform at home      Knee/Hip Exercises: Supine   Quad Sets 10 reps   5 sec hold   Terminal Knee Extension Limitations Longsitting quad set x 10 with strap around foot to pull knee into hyperextension, then controlled lowering using quad    Straight Leg Raises 10 reps                  PT Education - 03/19/21 0705    Education Details HEP update    Person(s) Educated Patient    Methods Explanation;Demonstration;Verbal cues;Handout    Comprehension Verbalized understanding;Need further instruction;Returned demonstration;Verbal cues required            PT Short Term Goals - 03/06/21 0922      PT SHORT TERM GOAL #1   Title Patient will increase knee flexion to 90 degrees    Baseline 90    Time 4    Period Weeks    Status Achieved    Target Date 03/07/21      PT SHORT TERM GOAL #2   Title Patient will demonstrate full extension    Baseline 0 following treatment    Time 4    Period Weeks    Status Achieved    Target Date 03/07/21      PT SHORT TERM GOAL #3   Title Patient will ambualte 300' without a device    Baseline patient able to ambulate with AD, continues with brace    Time 4    Period Weeks    Status Achieved    Target Date 03/07/21  PT Long Term Goals - 03/19/21 0710      PT LONG TERM GOAL #1   Title Patient will be indeepdent with exercise program to promote quad strength and single leg stability    Baseline HEP continues to be updated     Time 6    Period Weeks    Status On-going    Target Date 04/30/21      PT LONG TERM GOAL #2   Title Patient will demonstrate a 30 second left single leg stance in order to progress to dynamic single leg strength and stability    Baseline able to maintain 20 sec SLS    Time 6    Period Weeks    Status Achieved      PT LONG TERM GOAL #3   Title Patient will demonstrate >/= 4+/5 MMT of left knee in order to initiate running progression    Baseline 4-/5 MMT left knee    Time 6    Period Weeks    Status New    Target Date 04/30/21      PT LONG TERM GOAL #4   Title Patient will demonstrate >/= 4+/5 MMT hip strength to improve dynamic knee control with single leg tasks to progress to running    Baseline 4-/5 MMT    Time 6    Period Weeks    Status New    Target Date 04/30/21      PT LONG TERM GOAL #5   Title Patient will be able to perform single leg squat x10 without deviation to demonstrate appropriate motor control    Baseline unable    Time 6    Period Weeks    Status New    Target Date 04/30/21      Additional Long Term Goals   Additional Long Term Goals Yes      PT LONG TERM GOAL #6   Title Patient will report no increased pain or swelling with PT related activity or exercise in order to progress strength and to dynamic plyometric tasks    Baseline Patient reports soreness and occasional pain/swelling post with therapy    Time 6    Period Weeks    Status New    Target Date 04/30/21                 Plan - 03/19/21 0709    Clinical Impression Statement Patient tolerated therapy well with no adverse effects. She continues to exhibit difficulty with active terminal knee hyperextension in order to control end range extension, but she does demonstrate improvement with practice so encouraged to work on this frequently at home. Contnue progression for closed chain quad strengthening with lunges and leg press machine. Progressed hip strengthening with good  tolerance. She continues to progress as expected with PT. Patient would benefit from continued skilled PT to progress motion, strength, and back to function in order to return to activities such as valleyball.    PT Treatment/Interventions ADLs/Self Care Home Management;Electrical Stimulation;Ultrasound;Gait training;Stair training;Functional mobility training;Therapeutic activities;Therapeutic exercise;Patient/family education;Manual techniques;Passive range of motion;Scar mobilization;Taping    PT Next Visit Plan Progress HEP, work on ATKHE, quad stretch/knee flexion ROM, progress CKC strengthening as tolerated, dynamic balance activity    PT Home Exercise Plan [email or web address]    Consulted and Agree with Plan of Care Patient;Family member/caregiver    Family Member Consulted mother           Patient will benefit from skilled therapeutic intervention in order to improve the  following deficits and impairments:  Abnormal gait,Difficulty walking,Decreased safety awareness,Decreased activity tolerance,Decreased strength,Decreased mobility,Increased edema,Pain  Visit Diagnosis: Chronic pain of left knee  Stiffness of left knee, not elsewhere classified  Other abnormalities of gait and mobility  Rupture of anterior cruciate ligament of left knee, subsequent encounter     Problem List There are no problems to display for this patient.   Rosana Hoes, PT, DPT, LAT, ATC 03/20/21  8:42 AM Phone: [phone] Fax: [phone]   [facility] Outpatient Rehabilitation [facility] [address] [place], [place], [address] Phone: [phone]   Fax:  [phone]  Name: Elsbeth Yearick. Klinkner MRN: [id number] Date of Birth: 28-Feb-2001

## 2021-03-24 ENCOUNTER — Other Ambulatory Visit: Payer: Self-pay

## 2021-03-24 ENCOUNTER — Encounter: Payer: Self-pay | Admitting: Physical Therapy

## 2021-03-24 ENCOUNTER — Ambulatory Visit: Payer: PRIVATE HEALTH INSURANCE | Admitting: Physical Therapy

## 2021-03-24 DIAGNOSIS — R2689 Other abnormalities of gait and mobility: Secondary | ICD-10-CM

## 2021-03-24 DIAGNOSIS — M25562 Pain in left knee: Secondary | ICD-10-CM | POA: Diagnosis not present

## 2021-03-24 DIAGNOSIS — M25662 Stiffness of left knee, not elsewhere classified: Secondary | ICD-10-CM

## 2021-03-24 DIAGNOSIS — S83512D Sprain of anterior cruciate ligament of left knee, subsequent encounter: Secondary | ICD-10-CM

## 2021-03-24 DIAGNOSIS — G8929 Other chronic pain: Secondary | ICD-10-CM

## 2021-03-24 NOTE — Patient Instructions (Signed)
Access Code: [email or web address] URL: [email or web address] Date: 03/24/2021 Prepared by: Rosana Hoes  Exercises Prone Quadriceps Stretch with Strap - 2-3 x daily - 7 x weekly - 2 reps - 30 hold Supine Heel Slide with Strap - 2-3 x daily - 7 x weekly - 10 reps - 5 hold Long Sitting Quad Set - 2-3 x daily - 7 x weekly - 10 reps Active Straight Leg Raise with Quad Set - 1 x daily - 7 x weekly - 2 sets - 10 reps Hook Lying Single Knee to Chest Stretch with Towel - 1 x daily - 7 x weekly - 2 sets - 10 reps Seated Hamstring Curl with Anchored Resistance - 1 x daily - 5 x weekly - 3 sets - 10 reps Gastroc Stretch on Wall - 1 x daily - 7 x weekly - 2 reps - 30 hold Soleus Stretch on Wall - 1 x daily - 7 x weekly - 2 reps - 30 hold Single Leg Bridge - 1 x daily - 5 x weekly - 2 sets - 10 reps Single Leg Heel Raise with Counter Support - 1 x daily - 7 x weekly - 2 sets - 10 reps Standing Terminal Knee Extension with Resistance - 1 x daily - 5 x weekly - 2 sets - 10 reps Mini Lunge - 1 x daily - 5 x weekly - 2 sets - 10 reps Single Leg Stance - 1 x daily - 7 x weekly - 3 sets - 30 sec hold Tandem Stance with Eyes Closed - 1 x daily - 7 x weekly - 3 sets - 30 sec hold Single Leg Cone Touch - 1 x daily - 7 x weekly - 3 sets - 30 hold Side Stepping with Resistance at Thighs - 1 x daily - 5 x weekly - 3 sets - 20 reps Goblet Squat with Kettlebell - 1 x daily - 5 x weekly - 3 sets - 10 reps Forward Step Down with Heel Tap and Counter Support - 1 x daily - 5 x weekly - 3 sets - 5 reps

## 2021-03-24 NOTE — Therapy (Signed)
[facility] Outpatient Rehabilitation [facility] [address] [place], [place], [address] Phone: [phone]   Fax:  [phone]  Physical Therapy Treatment  Patient Details  Name: Cheryl Cole. Derick MRN: [id number] Date of Birth: August 02, 2001 Referring Provider (PT): DR Ramond Marrow   Encounter Date: 03/24/2021   PT End of Session - 03/24/21 0835    Visit Number 13    Number of Visits 24    Date for PT Re-Evaluation 04/30/21    Authorization Type UHC MCD    Authorization Time Period 02/28/21 - 04/01/21    Authorization - Visit Number 8    Authorization - Number of Visits 9    PT Start Time 0828    PT Stop Time 0915    PT Time Calculation (min) 47 min    Activity Tolerance Patient tolerated treatment well    Behavior During Therapy [facility] for tasks assessed/performed           Past Medical History:  Diagnosis Date  . Torn meniscus     Past Surgical History:  Procedure Laterality Date  . KNEE ARTHROSCOPY WITH ANTERIOR CRUCIATE LIGAMENT (ACL) REPAIR Left 02/05/2021   Procedure: KNEE ARTHROSCOPY WITH ANTERIOR CRUCIATE LIGAMENT (ACL) REPAIR WITH AUTOGRAFT;  Surgeon: Bjorn Pippin, MD;  Location: [facility];  Service: Orthopedics;  Laterality: Left;  . KNEE ARTHROSCOPY WITH LATERAL MENISECTOMY  02/05/2021   Procedure: KNEE ARTHROSCOPY WITH LATERAL MENISECTOMY;  Surgeon: Bjorn Pippin, MD;  Location: [facility];  Service: Orthopedics;;  . KNEE ARTHROSCOPY WITH MEDIAL MENISECTOMY Left 02/05/2021   Procedure: KNEE ARTHROSCOPY WITH  MEDIAL MENISCUS REPAIR;  Surgeon: Bjorn Pippin, MD;  Location: [facility];  Service: Orthopedics;  Laterality: Left;  . NO PAST SURGERIES      There were no vitals filed for this visit.   Subjective Assessment - 03/24/21 0828    Subjective Patient reports she feels frustrated with the active terminal knee hyperextension but has noticed slight progress. Not really sore following last visit.    Patient  Stated Goals Get back to previous level of activity    Currently in Pain? No/denies              [facility] PT Assessment - 03/24/21 0001      AROM   Left Knee Flexion 116                         [facility] Adult PT Treatment/Exercise - 03/24/21 0001      Exercises   Exercises Knee/Hip      Knee/Hip Exercises: Stretches   Quad Stretch 3 reps;30 seconds    Quad Stretch Limitations prone with strap      Knee/Hip Exercises: Machines for Strengthening   Cybex Leg Press SL: Left 20# 3 x 8; Right 60# x 8, 70# x 8, 80# x 8      Knee/Hip Exercises: Standing   Forward Lunges 10 reps    Forward Lunges Limitations stationary lunge at counter with left forward, single UE support    Forward Step Up 5 reps    Forward Step Up Limitations 8" box, exhibits difficulty with eccentric lowering    Step Down 2 sets;5 sets    Step Down Limitations forward heel tap on 2" box    Functional Squat 3 sets;10 reps    Functional Squat Limitations goblet squat to 24" table, 10#, staggered stance 3rd set; mirror for visual feedback for technique  Other Standing Knee Exercises Lateral band walk with blue around knees 3 x 20      Knee/Hip Exercises: Supine   Terminal Knee Extension Limitations Longsitting quad set x 10 with strap around foot to pull knee into hyperextension, then controlled lowering using quad    Straight Leg Raises 10 reps    Other Supine Knee/Hip Exercises Bridge with hamstring curl on physioball 2 x 8                  PT Education - 03/24/21 0833    Education Details HEP update, continue to work on knee flexion stretching    Person(s) Educated Patient    Methods Explanation;Demonstration;Verbal cues;Handout;Tactile cues    Comprehension Verbalized understanding;Returned demonstration;Verbal cues required;Tactile cues required;Need further instruction            PT Short Term Goals - 03/06/21 0922      PT SHORT TERM GOAL #1   Title Patient will increase knee  flexion to 90 degrees    Baseline 90    Time 4    Period Weeks    Status Achieved    Target Date 03/07/21      PT SHORT TERM GOAL #2   Title Patient will demonstrate full extension    Baseline 0 following treatment    Time 4    Period Weeks    Status Achieved    Target Date 03/07/21      PT SHORT TERM GOAL #3   Title Patient will ambualte 300' without a device    Baseline patient able to ambulate with AD, continues with brace    Time 4    Period Weeks    Status Achieved    Target Date 03/07/21             PT Long Term Goals - 03/19/21 0710      PT LONG TERM GOAL #1   Title Patient will be indeepdent with exercise program to promote quad strength and single leg stability    Baseline HEP continues to be updated    Time 6    Period Weeks    Status On-going    Target Date 04/30/21      PT LONG TERM GOAL #2   Title Patient will demonstrate a 30 second left single leg stance in order to progress to dynamic single leg strength and stability    Baseline able to maintain 20 sec SLS    Time 6    Period Weeks    Status Achieved      PT LONG TERM GOAL #3   Title Patient will demonstrate >/= 4+/5 MMT of left knee in order to initiate running progression    Baseline 4-/5 MMT left knee    Time 6    Period Weeks    Status New    Target Date 04/30/21      PT LONG TERM GOAL #4   Title Patient will demonstrate >/= 4+/5 MMT hip strength to improve dynamic knee control with single leg tasks to progress to running    Baseline 4-/5 MMT    Time 6    Period Weeks    Status New    Target Date 04/30/21      PT LONG TERM GOAL #5   Title Patient will be able to perform single leg squat x10 without deviation to demonstrate appropriate motor control    Baseline unable    Time 6    Period Weeks  Status New    Target Date 04/30/21      Additional Long Term Goals   Additional Long Term Goals Yes      PT LONG TERM GOAL #6   Title Patient will report no increased pain or  swelling with PT related activity or exercise in order to progress strength and to dynamic plyometric tasks    Baseline Patient reports soreness and occasional pain/swelling post with therapy    Time 6    Period Weeks    Status New    Target Date 04/30/21                 Plan - 03/24/21 0836    Clinical Impression Statement Patient tolerated therapy well with no adverse effects. She continues with difficulty in activer [facility] and limitations with knee flexion so encouraged her to continue working on this at home. She was able to progress with closed chain qaud strengthening with good tolerance. She does exhibit limitation with eccentric and significant strength deficit overall that is expected at this time in rehab. Patient would benefit from continued skilled PT to progress motion, strength, and back to function in order to return to activities such as valleyball.    PT Treatment/Interventions ADLs/Self Care Home Management;Electrical Stimulation;Ultrasound;Gait training;Stair training;Functional mobility training;Therapeutic activities;Therapeutic exercise;Patient/family education;Manual techniques;Passive range of motion;Scar mobilization;Taping    PT Next Visit Plan Progress HEP, work on ATKHE, quad stretch/knee flexion ROM, progress CKC strengthening as tolerated, dynamic balance activity    PT Home Exercise Plan [email or web address]    Consulted and Agree with Plan of Care Patient;Family member/caregiver    Family Member Consulted mother           Patient will benefit from skilled therapeutic intervention in order to improve the following deficits and impairments:  Abnormal gait,Difficulty walking,Decreased safety awareness,Decreased activity tolerance,Decreased strength,Decreased mobility,Increased edema,Pain  Visit Diagnosis: Chronic pain of left knee  Stiffness of left knee, not elsewhere classified  Other abnormalities of gait and mobility  Rupture of anterior cruciate ligament of  left knee, subsequent encounter     Problem List There are no problems to display for this patient.   Rosana Hoes, PT, DPT, LAT, ATC 03/24/21  9:57 AM Phone: [phone] Fax: [phone]   [facility] Outpatient Rehabilitation [facility] [address] [place], [place], [address] Phone: [phone]   Fax:  [phone]  Name: Faustine Tates. Billet MRN: [id number] Date of Birth: December 12, 2000

## 2021-03-26 ENCOUNTER — Encounter: Payer: Self-pay | Admitting: Physical Therapy

## 2021-03-26 ENCOUNTER — Ambulatory Visit: Payer: PRIVATE HEALTH INSURANCE | Admitting: Physical Therapy

## 2021-03-26 ENCOUNTER — Other Ambulatory Visit: Payer: Self-pay

## 2021-03-26 DIAGNOSIS — G8929 Other chronic pain: Secondary | ICD-10-CM

## 2021-03-26 DIAGNOSIS — S83512D Sprain of anterior cruciate ligament of left knee, subsequent encounter: Secondary | ICD-10-CM

## 2021-03-26 DIAGNOSIS — M25662 Stiffness of left knee, not elsewhere classified: Secondary | ICD-10-CM

## 2021-03-26 DIAGNOSIS — R2689 Other abnormalities of gait and mobility: Secondary | ICD-10-CM

## 2021-03-26 DIAGNOSIS — M25562 Pain in left knee: Secondary | ICD-10-CM | POA: Diagnosis not present

## 2021-03-26 NOTE — Therapy (Signed)
[facility] Outpatient Rehabilitation [facility] [address] [place], [place], [address] Phone: [phone]   Fax:  [phone]  Physical Therapy Treatment  Patient Details  Name: Cheryl Cole MRN: [id number] Date of Birth: 07-02-2001 Referring Provider (PT): DR Ramond Marrow   Encounter Date: 03/26/2021   PT End of Session - 03/26/21 0833    Visit Number 14    Number of Visits 24    Date for PT Re-Evaluation 04/30/21    Authorization Type UHC MCD    Authorization Time Period 02/28/21 - 04/01/21 - reauth on 03/26/21    Authorization - Visit Number 9    Authorization - Number of Visits 9    PT Start Time 0830    PT Stop Time 0915    PT Time Calculation (min) 45 min    Activity Tolerance Patient tolerated treatment well    Behavior During Therapy [facility] for tasks assessed/performed           Past Medical History:  Diagnosis Date  . Torn meniscus     Past Surgical History:  Procedure Laterality Date  . KNEE ARTHROSCOPY WITH ANTERIOR CRUCIATE LIGAMENT (ACL) REPAIR Left 02/05/2021   Procedure: KNEE ARTHROSCOPY WITH ANTERIOR CRUCIATE LIGAMENT (ACL) REPAIR WITH AUTOGRAFT;  Surgeon: Bjorn Pippin, MD;  Location: [facility];  Service: Orthopedics;  Laterality: Left;  . KNEE ARTHROSCOPY WITH LATERAL MENISECTOMY  02/05/2021   Procedure: KNEE ARTHROSCOPY WITH LATERAL MENISECTOMY;  Surgeon: Bjorn Pippin, MD;  Location: [facility];  Service: Orthopedics;;  . KNEE ARTHROSCOPY WITH MEDIAL MENISECTOMY Left 02/05/2021   Procedure: KNEE ARTHROSCOPY WITH  MEDIAL MENISCUS REPAIR;  Surgeon: Bjorn Pippin, MD;  [facility];  Service: Orthopedics;  Laterality: Left;  . NO PAST SURGERIES      There were no vitals filed for this visit.   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    Patient Stated Goals Get back  to previous level of activity    Currently in Pain? Yes    Pain Score 3     Pain Location Knee    Pain Orientation Left    Pain Descriptors / Indicators Sore    Pain Type Surgical pain    Pain Onset More than a month ago    Pain Frequency Intermittent              [facility] PT Assessment - 03/26/21 0001      AROM   Left Knee Extension 0    Left Knee Flexion 120      PROM   Left Knee Extension 5   hyper   Left Knee Flexion 124                         [facility] Adult PT Treatment/Exercise - 03/26/21 0001      Exercises   Exercises Knee/Hip      Knee/Hip Exercises: Stretches   Passive Hamstring Stretch 2 reps;60 seconds    Passive Hamstring Stretch Limitations seated with foot propped on chair and pressure over knee    Quad Stretch 2 reps;60 seconds    Quad Stretch Limitations prone with strap    Gastroc Stretch 2 reps;60 seconds    Gastroc Stretch Limitations slant board      Knee/Hip Exercises: Aerobic   Recumbent Bike 5  min fwd full revolutions for ROM while taking subjective      Knee/Hip Exercises: Machines for Strengthening   Cybex Leg Press SL: Left 20# 3 x 8; Right 80# 3 x 8      Knee/Hip Exercises: Standing   Other Standing Knee Exercises Retro/eccentric fwd walking using freemotion 17# x 5   focus on left quad ATKHE   Other Standing Knee Exercises Retro walking on treadmill x 3 min   focus on left quad ATKHE     Knee/Hip Exercises: Supine   Quad Sets 2 sets;10 reps   10 sec hold   Quad Sets Limitations towel under hee x 5, towel under knee x 5, e-stim x 10; focus on ATKHE    Straight Leg Raises 10 reps      Modalities   Modalities Electrical Stimulation      Electrical Stimulation   Electrical Stimulation Location Left quad   2x4" pads   Electrical Stimulation Action Russian    Electrical Stimulation Parameters 50pps, 2 ramp, 10/50 on/off cycle x 10 min    Electrical Stimulation Goals Neuromuscular facilitation   attended with tactile cueing  for quad activation                 PT Education - 03/26/21 [phone]    Education Details HEP, using ACE wrap compression, elevating, icing to reduce swelling with extended periods of standing    Person(s) Educated Patient    Methods Explanation    Comprehension Verbalized understanding;Need further instruction            PT Short Term Goals - 03/26/21 1053      PT SHORT TERM GOAL #1   Title Patient will increase knee flexion to 90 degrees    Baseline 120 - 03/26/21    Time 4    Period Weeks    Status Achieved    Target Date 03/07/21      PT SHORT TERM GOAL #2   Title Patient will demonstrate full extension    Baseline 0 deg knee extension AROM - 03/26/21    Time 4    Period Weeks    Status Achieved    Target Date 03/07/21      PT SHORT TERM GOAL #3   Title Patient will ambualte 300' without a device    Baseline no longer using AD with ambulation - 03/26/21    Time 4    Period Weeks    Status Achieved    Target Date 03/07/21             PT Long Term Goals - 03/26/21 1054      PT LONG TERM GOAL #1   Title Patient will be indeepdent with exercise program to promote quad strength and single leg stability    Baseline HEP continues to be updated and patient requires cueing for exercise technique - 03/26/21    Time 6    Period Weeks    Status On-going    Target Date 04/30/21      PT LONG TERM GOAL #2   Title Patient will demonstrate a 30 second left single leg stance in order to progress to dynamic single leg strength and stability    Baseline Patient able to maintain SLS > 30 sec bilaterally - 03/26/21    Time 6    Period Weeks    Status Achieved      PT LONG TERM GOAL #3   Title Patient will demonstrate >/= 4+/5 MMT of  left knee in order to initiate running progression    Baseline 4-/5 MMT left knee - 03/26/21    Time 6    Period Weeks    Status On-going    Target Date 04/30/21      PT LONG TERM GOAL #4   Title Patient will demonstrate >/= 4+/5 MMT hip  strength to improve dynamic knee control with single leg tasks to progress to running    Baseline 4-/5 MMT - 03/26/21    Time 6    Period Weeks    Status On-going    Target Date 04/30/21      PT LONG TERM GOAL #5   Title Patient will be able to perform single leg squat x10 without deviation to demonstrate appropriate motor control    Baseline unable to perform SL squat at this time - 03/26/21    Time 6    Period Weeks    Status On-going    Target Date 04/30/21      PT LONG TERM GOAL #6   Title Patient will report no increased pain or swelling with PT related activity or exercise in order to progress strength and to dynamic plyometric tasks    Baseline Patient reports soreness and occasional pain/swelling post with therapy and with extended periods of standing - 03/26/21    Time 6    Period Weeks    Status On-going    Target Date 04/30/21                 Plan - 03/26/21 0835    Clinical Impression Statement Patient tolerated therapy well with no adverse effects. Patient is progressing toward established goals, but continues to be limited with quad strength and control with active hyperextension, limitations with knee flexion range of motion, all leading to deviations with functional moving and standing/walking tolerance. Incorporated russian stim this visit to promote quad activation into hyperextension and followed up with standing exercises with focus on utilizing quad for knee extension and strengthening. Patient did exhibit slight increased in knee swelling this visit due to extended period of standing/walking yesterday so encouraged her to continue to practice swelling control with compression, elevation, and icing. Patient would benefit from continued skilled PT to progress motion, strength, and back to function in order to return to activities such as valleyball.    PT Treatment/Interventions ADLs/Self Care Home Management;Electrical Stimulation;Ultrasound;Gait training;Stair  training;Functional mobility training;Therapeutic activities;Therapeutic exercise;Patient/family education;Manual techniques;Passive range of motion;Scar mobilization;Taping    PT Next Visit Plan Progress HEP, work on ATKHE, quad stretch/knee flexion ROM, progress CKC strengthening as tolerated, dynamic balance activity    PT Home Exercise Plan [email or web address]    Consulted and Agree with Plan of Care Patient;Family member/caregiver    Family Member Consulted mother           Patient will benefit from skilled therapeutic intervention in order to improve the following deficits and impairments:  Abnormal gait,Difficulty walking,Decreased safety awareness,Decreased activity tolerance,Decreased strength,Decreased mobility,Increased edema,Pain  Visit Diagnosis: Chronic pain of left knee  Stiffness of left knee, not elsewhere classified  Other abnormalities of gait and mobility  Rupture of anterior cruciate ligament of left knee, subsequent encounter     Problem List There are no problems to display for this patient.   Rosana Hoesampbell Zhania Shaheen, PT, DPT, LAT, ATC 03/26/21  11:03 AM Phone: [phone] Fax: [phone]   [facility] [address] [place], [place], [address] Phone: [phone]   Fax:  [phone]  Name: Cheryl Cole MRN: [id number] Date of Birth: 02/22/2001   Check all possible CPT codes: 44315- Therapeutic Exercise, [phone]- Neuro Re-education, [phone] - Gait Training, [phone] - Manual Therapy, 97530 - Therapeutic Activities, 97535 - Self Care, [id number] - Electrical stimulation (unattended), [id number] - Electrical stimulation (Manual), [id number] - Vaso and [id number] - Aquatic therapy

## 2021-03-26 NOTE — Patient Instructions (Signed)
Access Code: [email or web address] URL: [email or web address] Date: 03/26/2021 Prepared by: Rosana Hoes  Exercises Prone Quadriceps Stretch with Strap - 2-3 x daily - 7 x weekly - 2 reps - 30 hold Supine Heel Slide with Strap - 2-3 x daily - 7 x weekly - 10 reps - 5 hold Long Sitting Quad Set - 2-3 x daily - 7 x weekly - 10 reps Active Straight Leg Raise with Quad Set - 1 x daily - 7 x weekly - 2 sets - 10 reps Hook Lying Single Knee to Chest Stretch with Towel - 1 x daily - 7 x weekly - 2 sets - 10 reps Seated Hamstring Curl with Anchored Resistance - 1 x daily - 5 x weekly - 3 sets - 10 reps Gastroc Stretch on Wall - 1 x daily - 7 x weekly - 2 reps - 30 hold Soleus Stretch on Wall - 1 x daily - 7 x weekly - 2 reps - 30 hold Single Leg Bridge - 1 x daily - 5 x weekly - 2 sets - 10 reps Single Leg Heel Raise with Counter Support - 1 x daily - 7 x weekly - 2 sets - 10 reps Standing Terminal Knee Extension with Resistance - 1 x daily - 5 x weekly - 2 sets - 10 reps Mini Lunge - 1 x daily - 5 x weekly - 2 sets - 10 reps Single Leg Stance - 1 x daily - 7 x weekly - 3 sets - 30 sec hold Tandem Stance with Eyes Closed - 1 x daily - 7 x weekly - 3 sets - 30 sec hold Single Leg Cone Touch - 1 x daily - 7 x weekly - 3 sets - 30 hold Side Stepping with Resistance at Thighs - 1 x daily - 5 x weekly - 3 sets - 20 reps Goblet Squat with Kettlebell - 1 x daily - 5 x weekly - 3 sets - 10 reps Forward Step Down with Heel Tap and Counter Support - 1 x daily - 5 x weekly - 3 sets - 5 reps

## 2021-04-02 ENCOUNTER — Ambulatory Visit: Payer: PRIVATE HEALTH INSURANCE | Attending: Orthopaedic Surgery | Admitting: Physical Therapy

## 2021-04-02 ENCOUNTER — Encounter: Payer: Self-pay | Admitting: Physical Therapy

## 2021-04-02 ENCOUNTER — Other Ambulatory Visit: Payer: Self-pay

## 2021-04-02 DIAGNOSIS — S83512D Sprain of anterior cruciate ligament of left knee, subsequent encounter: Secondary | ICD-10-CM

## 2021-04-02 DIAGNOSIS — M25662 Stiffness of left knee, not elsewhere classified: Secondary | ICD-10-CM | POA: Insufficient documentation

## 2021-04-02 DIAGNOSIS — M25562 Pain in left knee: Secondary | ICD-10-CM | POA: Insufficient documentation

## 2021-04-02 DIAGNOSIS — R2689 Other abnormalities of gait and mobility: Secondary | ICD-10-CM | POA: Diagnosis present

## 2021-04-02 DIAGNOSIS — G8929 Other chronic pain: Secondary | ICD-10-CM

## 2021-04-02 NOTE — Therapy (Signed)
[facility] Outpatient Rehabilitation [facility] [address] [place], [place], [address] Phone: [phone]   Fax:  [phone]  Physical Therapy Treatment  Patient Details  Name: Cheryl Cole MRN: [id number] Date of Birth: [birth year] Referring Provider (PT): DR Ramond Marrow   Encounter Date: 04/02/2021   PT End of Session - 04/02/21 1357    Visit Number 15    Number of Visits 24    Date for PT Re-Evaluation 04/30/21    Authorization Type [employer] MCD    Authorization Time Period 04/02/21 - 05/01/21    Authorization - Visit Number 1    Authorization - Number of Visits 8    PT Start Time 1355    PT Stop Time 1440    PT Time Calculation (min) 45 min    Activity Tolerance Patient tolerated treatment well    Behavior During Therapy [facility] for tasks assessed/performed           Past Medical History:  Diagnosis Date  . Torn meniscus     Past Surgical History:  Procedure Laterality Date  . KNEE ARTHROSCOPY WITH ANTERIOR CRUCIATE LIGAMENT (ACL) REPAIR Left 02/05/2021   Procedure: KNEE ARTHROSCOPY WITH ANTERIOR CRUCIATE LIGAMENT (ACL) REPAIR WITH AUTOGRAFT;  Surgeon: Bjorn Pippin, MD;  Location: [facility];  Service: Orthopedics;  Laterality: Left;  . KNEE ARTHROSCOPY WITH LATERAL MENISECTOMY  02/05/2021   Procedure: KNEE ARTHROSCOPY WITH LATERAL MENISECTOMY;  Surgeon: Bjorn Pippin, MD;  Location: [facility];  Service: Orthopedics;;  . KNEE ARTHROSCOPY WITH MEDIAL MENISECTOMY Left 02/05/2021   Procedure: KNEE ARTHROSCOPY WITH  MEDIAL MENISCUS REPAIR;  Surgeon: Bjorn Pippin, MD;  Location: [facility];  Service: Orthopedics;  Laterality: Left;  . NO PAST SURGERIES      There were no vitals filed for this visit.   Subjective Assessment - 04/02/21 1354    Subjective Patient reports she is doing well with no new issues. She reports she is improving with some of her exercises, especially the active hyperextension.    Patient is  accompained by: Family member   mother   Patient Stated Goals Get back to previous level of activity    Currently in Pain? No/denies              [facility] PT Assessment - 04/02/21 0001      AROM   Left Knee Flexion 125      Ambulation/Gait   Gait Comments Patient exhibits limitation in knee extension at heel strike                         [facility] Adult PT Treatment/Exercise - 04/02/21 0001      Exercises   Exercises Knee/Hip      Knee/Hip Exercises: Stretches   Other Knee/Hip Stretches Discussion on hamstring stretching and using heel prop with weight over knee      Knee/Hip Exercises: Machines for Strengthening   Cybex Leg Press SL: Left 20# x 8, 30# 2 x 6; Right 80# x 8, 100# 2 x8      Knee/Hip Exercises: Standing   Terminal Knee Extension 15 reps    Theraband Level (Terminal Knee Extension) Level 2 (Red)    Step Down 3 sets;5 reps    Step Down Limitations lateral heel tap on 4" box    Functional Squat 3 sets;10 reps    Functional Squat Limitations goblet squat to 24" table, 10#, staggered stance set 2-3; mirror  for visual feedback for technique    Other Standing Knee Exercises Lateral band walk with blue around mid-shin 3 x 20    Other Standing Knee Exercises TRX fwd heel tap to 1/2 reverse lunge on 2" box 2 x 5      Knee/Hip Exercises: Supine   Straight Leg Raises 10 reps    Other Supine Knee/Hip Exercises Bridge with hamstring curl on physioball 3 x 8                  PT Education - 04/02/21 1357    Education Details HEP, progressing squat to staggered stance and increased depth, continue focus on stretching for knee extension    Person(s) Educated Patient    Methods Explanation;Demonstration;Verbal cues;Tactile cues    Comprehension Verbalized understanding;Need further instruction;Returned demonstration;Verbal cues required;Tactile cues required            PT Short Term Goals - 03/26/21 1053      PT SHORT TERM GOAL #1   Title Patient  will increase knee flexion to 90 degrees    Baseline 120 - 03/26/21    Time 4    Period Weeks    Status Achieved    Target Date 03/07/21      PT SHORT TERM GOAL #2   Title Patient will demonstrate full extension    Baseline 0 deg knee extension AROM - 03/26/21    Time 4    Period Weeks    Status Achieved    Target Date 03/07/21      PT SHORT TERM GOAL #3   Title Patient will ambualte 300' without a device    Baseline no longer using AD with ambulation - 03/26/21    Time 4    Period Weeks    Status Achieved    Target Date 03/07/21             PT Long Term Goals - 03/26/21 1054      PT LONG TERM GOAL #1   Title Patient will be indeepdent with exercise program to promote quad strength and single leg stability    Baseline HEP continues to be updated and patient requires cueing for exercise technique - 03/26/21    Time 6    Period Weeks    Status On-going    Target Date 04/30/21      PT LONG TERM GOAL #2   Title Patient will demonstrate a 30 second left single leg stance in order to progress to dynamic single leg strength and stability    Baseline Patient able to maintain SLS > 30 sec bilaterally - 03/26/21    Time 6    Period Weeks    Status Achieved      PT LONG TERM GOAL #3   Title Patient will demonstrate >/= 4+/5 MMT of left knee in order to initiate running progression    Baseline 4-/5 MMT left knee - 03/26/21    Time 6    Period Weeks    Status On-going    Target Date 04/30/21      PT LONG TERM GOAL #4   Title Patient will demonstrate >/= 4+/5 MMT hip strength to improve dynamic knee control with single leg tasks to progress to running    Baseline 4-/5 MMT - 03/26/21    Time 6    Period Weeks    Status On-going    Target Date 04/30/21      PT LONG TERM GOAL #5   Title Patient  will be able to perform single leg squat x10 without deviation to demonstrate appropriate motor control    Baseline unable to perform SL squat at this time - 03/26/21    Time 6     Period Weeks    Status On-going    Target Date 04/30/21      PT LONG TERM GOAL #6   Title Patient will report no increased pain or swelling with PT related activity or exercise in order to progress strength and to dynamic plyometric tasks    Baseline Patient reports soreness and occasional pain/swelling post with therapy and with extended periods of standing - 03/26/21    Time 6    Period Weeks    Status On-going    Target Date 04/30/21                 Plan - 04/02/21 1358    Clinical Impression Statement Patient tolerated therapy well with no adverse effects. She did exhibit greater tightness into knee extension this visit so discussed continued daily focus on maintaining extension with stretching and heel prop. Patient is progressing with quad strengthening but continues to demonstrate poor eccentric control. Squatting progressed for HEP, she did require cues tp avoid shift away from left. Patient would benefit from continued skilled PT to progress motion, strength, and back to function in order to return to activities such as valleyball.    PT Treatment/Interventions ADLs/Self Care Home Management;Electrical Stimulation;Ultrasound;Gait training;Stair training;Functional mobility training;Therapeutic activities;Therapeutic exercise;Patient/family education;Manual techniques;Passive range of motion;Scar mobilization;Taping    PT Next Visit Plan Progress HEP, work on ATKHE, quad stretch/knee flexion ROM, progress CKC strengthening as tolerated, dynamic balance activity    PT Home Exercise Plan [id number]    Consulted and Agree with Plan of Care Patient;Family member/caregiver    Family Member Consulted mother           Patient will benefit from skilled therapeutic intervention in order to improve the following deficits and impairments:  Abnormal gait,Difficulty walking,Decreased safety awareness,Decreased activity tolerance,Decreased strength,Decreased mobility,Increased  edema,Pain  Visit Diagnosis: Chronic pain of left knee  Stiffness of left knee, not elsewhere classified  Other abnormalities of gait and mobility  Rupture of anterior cruciate ligament of left knee, subsequent encounter     Problem List There are no problems to display for this patient.   Rosana Hoes, PT, DPT, LAT, ATC 04/02/21  2:52 PM Phone: [phone] Fax: [phone]   [facility] Outpatient Rehabilitation [facility] [address] [place], [place], [address] Phone: [phone]   Fax:  [phone]  Name: Cheryl Cole MRN: [id number] Date of Birth: 12-20-2000

## 2021-04-04 ENCOUNTER — Encounter: Payer: Self-pay | Admitting: Physical Therapy

## 2021-04-04 ENCOUNTER — Other Ambulatory Visit: Payer: Self-pay

## 2021-04-04 ENCOUNTER — Ambulatory Visit: Payer: PRIVATE HEALTH INSURANCE | Admitting: Physical Therapy

## 2021-04-04 DIAGNOSIS — M25662 Stiffness of left knee, not elsewhere classified: Secondary | ICD-10-CM

## 2021-04-04 DIAGNOSIS — R2689 Other abnormalities of gait and mobility: Secondary | ICD-10-CM

## 2021-04-04 DIAGNOSIS — M25562 Pain in left knee: Secondary | ICD-10-CM | POA: Diagnosis not present

## 2021-04-04 DIAGNOSIS — S83512D Sprain of anterior cruciate ligament of left knee, subsequent encounter: Secondary | ICD-10-CM

## 2021-04-04 DIAGNOSIS — G8929 Other chronic pain: Secondary | ICD-10-CM

## 2021-04-04 NOTE — Therapy (Signed)
[facility] Outpatient Rehabilitation [facility] [address] [place], [place], [address] Phone: [phone]   Fax:  [phone]  Physical Therapy Treatment  Patient Details  Name: Cheryl Cole MRN: [id number] Date of Birth: [birth year] Referring Provider (PT): DR Ramond Marrow   Encounter Date: 04/04/2021   PT End of Session - 04/04/21 0830    Visit Number 16    Number of Visits 24    Date for PT Re-Evaluation 04/30/21    Authorization Type UHC MCD    Authorization Time Period 04/02/21 - 05/01/21    Authorization - Visit Number 2    Authorization - Number of Visits 8    PT Start Time 0828    PT Stop Time 0915    PT Time Calculation (min) 47 min    Activity Tolerance Patient tolerated treatment well    Behavior During Therapy [facility] for tasks assessed/performed           Past Medical History:  Diagnosis Date  . Torn meniscus     Past Surgical History:  Procedure Laterality Date  . KNEE ARTHROSCOPY WITH ANTERIOR CRUCIATE LIGAMENT (ACL) REPAIR Left 02/05/2021   Procedure: KNEE ARTHROSCOPY WITH ANTERIOR CRUCIATE LIGAMENT (ACL) REPAIR WITH AUTOGRAFT;  Surgeon: Bjorn Pippin, MD;  Location: [facility];  Service: Orthopedics;  Laterality: Left;  . KNEE ARTHROSCOPY WITH LATERAL MENISECTOMY  02/05/2021   Procedure: KNEE ARTHROSCOPY WITH LATERAL MENISECTOMY;  Surgeon: Bjorn Pippin, MD;  Location: [facility];  Service: Orthopedics;;  . KNEE ARTHROSCOPY WITH MEDIAL MENISECTOMY Left 02/05/2021   Procedure: KNEE ARTHROSCOPY WITH  MEDIAL MENISCUS REPAIR;  Surgeon: Bjorn Pippin, MD;  Location: [facility];  Service: Orthopedics;  Laterality: Left;  . NO PAST SURGERIES      There were no vitals filed for this visit.   Subjective Assessment - 04/04/21 0829    Subjective Patient reports the knee is feeling good, was feeling a little stiff but just saw her doctor this morning and he took some fluid off her knee which makes it feel looser.     Patient Stated Goals Get back to previous level of activity    Currently in Pain? No/denies                             [facility] Adult PT Treatment/Exercise - 04/04/21 0001      Exercises   Exercises Knee/Hip      Knee/Hip Exercises: Stretches   Other Knee/Hip Stretches Heel prop with MHP to posterior knee x 5 min      Knee/Hip Exercises: Aerobic   Recumbent Bike L3 x 3 min while taking subjective      Knee/Hip Exercises: Machines for Strengthening   Cybex Knee Flexion SL: left 15# 3 x 8, right 25# 3 x 8      Knee/Hip Exercises: Standing   Terminal Knee Extension 2 sets;10 reps    Theraband Level (Terminal Knee Extension) Level 2 (Red)    Step Down 3 sets;5 sets    Step Down Limitations TRX assisted forward heel tap on 4" box, patient unable to reach heel to ground    Functional Squat 3 sets   8 reps   Functional Squat Limitations goblet squat to 24" table, 15#, staggered stance      Knee/Hip Exercises: Seated   Long Arc Quad 2 sets;10 reps      Knee/Hip Exercises: Supine   Quad Sets  10 reps   5 sec hold   Quad Sets Limitations FR under heel    Straight Leg Raises 10 reps    Straight Leg Raises Limitations FR under heel      Knee/Hip Exercises: Prone   Other Prone Exercises TKE x 10 with 5 second      Modalities   Modalities Moist Heat      Moist Heat Therapy   Number Minutes Moist Heat 5 Minutes   in combination with heel prop   Moist Heat Location Knee   posterior                 PT Education - 04/04/21 0830    Education Details HEP, progressing squat to staggered stance and increased depth, continue focus on stretching for knee extension with heel prop    Person(s) Educated Patient    Methods Explanation;Demonstration;Verbal cues    Comprehension Verbalized understanding;Need further instruction;Returned demonstration;Verbal cues required            PT Short Term Goals - 03/26/21 1053      PT SHORT TERM GOAL #1   Title Patient  will increase knee flexion to 90 degrees    Baseline 120 - 03/26/21    Time 4    Period Weeks    Status Achieved    Target Date 03/07/21      PT SHORT TERM GOAL #2   Title Patient will demonstrate full extension    Baseline 0 deg knee extension AROM - 03/26/21    Time 4    Period Weeks    Status Achieved    Target Date 03/07/21      PT SHORT TERM GOAL #3   Title Patient will ambualte 300' without a device    Baseline no longer using AD with ambulation - 03/26/21    Time 4    Period Weeks    Status Achieved    Target Date 03/07/21             PT Long Term Goals - 03/26/21 1054      PT LONG TERM GOAL #1   Title Patient will be indeepdent with exercise program to promote quad strength and single leg stability    Baseline HEP continues to be updated and patient requires cueing for exercise technique - 03/26/21    Time 6    Period Weeks    Status On-going    Target Date 04/30/21      PT LONG TERM GOAL #2   Title Patient will demonstrate a 30 second left single leg stance in order to progress to dynamic single leg strength and stability    Baseline Patient able to maintain SLS > 30 sec bilaterally - 03/26/21    Time 6    Period Weeks    Status Achieved      PT LONG TERM GOAL #3   Title Patient will demonstrate >/= 4+/5 MMT of left knee in order to initiate running progression    Baseline 4-/5 MMT left knee - 03/26/21    Time 6    Period Weeks    Status On-going    Target Date 04/30/21      PT LONG TERM GOAL #4   Title Patient will demonstrate >/= 4+/5 MMT hip strength to improve dynamic knee control with single leg tasks to progress to running    Baseline 4-/5 MMT - 03/26/21    Time 6    Period Weeks    Status On-going  Target Date 04/30/21      PT LONG TERM GOAL #5   Title Patient will be able to perform single leg squat x10 without deviation to demonstrate appropriate motor control    Baseline unable to perform SL squat at this time - 03/26/21    Time 6     Period Weeks    Status On-going    Target Date 04/30/21      PT LONG TERM GOAL #6   Title Patient will report no increased pain or swelling with PT related activity or exercise in order to progress strength and to dynamic plyometric tasks    Baseline Patient reports soreness and occasional pain/swelling post with therapy and with extended periods of standing - 03/26/21    Time 6    Period Weeks    Status On-going    Target Date 04/30/21                 Plan - 04/04/21 0847    Clinical Impression Statement Patient tolerated therapy well with no adverse effects. Patient had fluid drawn off knee just prior to therapy, and therapy focused on continued knee extension and quad strengthening. Use of LLLD stretching with MHP applied to posterior knee at start of therapy. Patient did demonstrate improved knee extension following heel prop but continues to demonstrate quad weakness and poor control. Encouraged patient to use heel prop at home in combination with stretching and quad activation to maintain gains of knee extension. Patient would benefit from continued skilled PT to progress motion, strength, and back to function in order to return to activities such as valleyball.    PT Treatment/Interventions ADLs/Self Care Home Management;Electrical Stimulation;Ultrasound;Gait training;Stair training;Functional mobility training;Therapeutic activities;Therapeutic exercise;Patient/family education;Manual techniques;Passive range of motion;Scar mobilization;Taping    PT Next Visit Plan Progress HEP, work on ATKHE, quad stretch/knee flexion ROM, progress CKC strengthening as tolerated, dynamic balance activity    PT Home Exercise Plan [email or web address]    Consulted and Agree with Plan of Care Patient;Family member/caregiver    Family Member Consulted mother           Patient will benefit from skilled therapeutic intervention in order to improve the following deficits and impairments:  Abnormal  gait,Difficulty walking,Decreased safety awareness,Decreased activity tolerance,Decreased strength,Decreased mobility,Increased edema,Pain  Visit Diagnosis: Chronic pain of left knee  Stiffness of left knee, not elsewhere classified  Other abnormalities of gait and mobility  Rupture of anterior cruciate ligament of left knee, subsequent encounter     Problem List There are no problems to display for this patient.   Rosana Hoes, PT, DPT, LAT, ATC 04/04/21  9:23 AM Phone: [phone] Fax: [phone]   [facility] Outpatient Rehabilitation [facility] [address] [place], [place], [address] Phone: [phone]   Fax:  [phone]  Name: Cheryl Cole MRN: [id number] Date of Birth: [birth year]

## 2021-04-14 ENCOUNTER — Encounter: Payer: Self-pay | Admitting: Physical Therapy

## 2021-04-14 ENCOUNTER — Ambulatory Visit: Payer: PRIVATE HEALTH INSURANCE | Admitting: Physical Therapy

## 2021-04-14 ENCOUNTER — Other Ambulatory Visit: Payer: Self-pay

## 2021-04-14 DIAGNOSIS — M25662 Stiffness of left knee, not elsewhere classified: Secondary | ICD-10-CM

## 2021-04-14 DIAGNOSIS — M25562 Pain in left knee: Secondary | ICD-10-CM

## 2021-04-14 DIAGNOSIS — S83512D Sprain of anterior cruciate ligament of left knee, subsequent encounter: Secondary | ICD-10-CM

## 2021-04-14 DIAGNOSIS — G8929 Other chronic pain: Secondary | ICD-10-CM

## 2021-04-14 DIAGNOSIS — R2689 Other abnormalities of gait and mobility: Secondary | ICD-10-CM

## 2021-04-14 NOTE — Therapy (Signed)
[facility] [address] [place], [place], [address] Phone: [phone]   Fax:  [phone]  Physical Therapy Treatment  Patient Details  Name: Cheryl Cole MRN: [id number] Date of Birth: Mar 09, 2001 Referring Provider (PT): DR Ramond Marrow   Encounter Date: 04/14/2021   PT End of Session - 04/14/21 0833     Visit Number 17    Number of Visits 24    Date for PT Re-Evaluation 04/30/21    Authorization Type [employer] MCD    Authorization Time Period 04/02/21 - 05/01/21    Authorization - Visit Number 3    Authorization - Number of Visits 8    PT Start Time 0830    PT Stop Time 0915    PT Time Calculation (min) 45 min    Activity Tolerance Patient tolerated treatment well    Behavior During Therapy [facility] for tasks assessed/performed             Past Medical History:  Diagnosis Date   Torn meniscus     Past Surgical History:  Procedure Laterality Date   KNEE ARTHROSCOPY WITH ANTERIOR CRUCIATE LIGAMENT (ACL) REPAIR Left 02/05/2021   Procedure: KNEE ARTHROSCOPY WITH ANTERIOR CRUCIATE LIGAMENT (ACL) REPAIR WITH AUTOGRAFT;  Surgeon: Bjorn Pippin, MD;  Location: [facility];  Service: Orthopedics;  Laterality: Left;   KNEE ARTHROSCOPY WITH LATERAL MENISECTOMY  02/05/2021   Procedure: KNEE ARTHROSCOPY WITH LATERAL MENISECTOMY;  Surgeon: Bjorn Pippin, MD;  Location: [facility];  Service: Orthopedics;;   KNEE ARTHROSCOPY WITH MEDIAL MENISECTOMY Left 02/05/2021   Procedure: KNEE ARTHROSCOPY WITH  MEDIAL MENISCUS REPAIR;  Surgeon: Bjorn Pippin, MD;  Location: [facility];  Service: Orthopedics;  Laterality: Left;   NO PAST SURGERIES      There were no vitals filed for this visit.   Subjective Assessment - 04/14/21 0831     Subjective Patient reports she was a little sore behind the knee over her trip but she feels she was walking more and thinks that is the reason.    Patient Stated Goals Get  back to previous level of activity    Currently in Pain? No/denies                [facility] PT Assessment - 04/14/21 0001       Assessment   Medical Diagnosis L ACL with BTB graft and Medial meniscal reapair    Referring Provider (PT) DR Ramond Marrow    Onset Date/Surgical Date 02/05/21      Precautions   Precautions None      Restrictions   Weight Bearing Restrictions No      Balance Screen   Has the patient fallen in the past 6 months No      AROM   Left Knee Extension 2   hyper   Left Knee Flexion 126      PROM   Left Knee Extension 5   hyper   Left Knee Flexion 131                           [facility] Adult PT Treatment/Exercise - 04/14/21 0001       Exercises   Exercises Knee/Hip      Knee/Hip Exercises: Stretches   Passive Hamstring Stretch 2 reps;30 seconds    Passive Hamstring Stretch Limitations supine with strap    Quad Stretch 2 reps;60 seconds    Lobbyist  Limitations prone with strap      Knee/Hip Exercises: Aerobic   Elliptical L3 R1 x 5 min      Knee/Hip Exercises: Machines for Strengthening   Cybex Knee Flexion SL: left 20# x 8, 25# 2 x 8    Cybex Leg Press SL: left 40# 2 x 6, x 8; right 100# 3 x 8      Knee/Hip Exercises: Standing   Step Down 3 sets;5 sets    Step Down Limitations TRX assisted forward heel tap on 4" box    Functional Squat 2 sets;10 reps    Functional Squat Limitations heels propped BW squat, patient exhibits eight shift and rotation to right to reduce left quad, used bolsters in front of knees for cueing    SLS Airex with ball toss 3 x 30 sec    Other Standing Knee Exercises Lateral band walk with blue around mid-shin 2 x 30      Knee/Hip Exercises: Seated   Long Arc Quad 2 sets;10 reps   1 sec hold at extension   [employer] Weight 1 lbs.                    PT Education - 04/14/21 0833     Education Details HEP    Person(s) Educated Patient    Methods Explanation;Demonstration;Verbal cues     Comprehension Verbalized understanding;Returned demonstration;Verbal cues required;Need further instruction              PT Short Term Goals - 03/26/21 1053       PT SHORT TERM GOAL #1   Title Patient will increase knee flexion to 90 degrees    Baseline 120 - 03/26/21    Time 4    Period Weeks    Status Achieved    Target Date 03/07/21      PT SHORT TERM GOAL #2   Title Patient will demonstrate full extension    Baseline 0 deg knee extension AROM - 03/26/21    Time 4    Period Weeks    Status Achieved    Target Date 03/07/21      PT SHORT TERM GOAL #3   Title Patient will ambualte 300' without a device    Baseline no longer using AD with ambulation - 03/26/21    Time 4    Period Weeks    Status Achieved    Target Date 03/07/21               PT Long Term Goals - 03/26/21 1054       PT LONG TERM GOAL #1   Title Patient will be indeepdent with exercise program to promote quad strength and single leg stability    Baseline HEP continues to be updated and patient requires cueing for exercise technique - 03/26/21    Time 6    Period Weeks    Status On-going    Target Date 04/30/21      PT LONG TERM GOAL #2   Title Patient will demonstrate a 30 second left single leg stance in order to progress to dynamic single leg strength and stability    Baseline Patient able to maintain SLS > 30 sec bilaterally - 03/26/21    Time 6    Period Weeks    Status Achieved      PT LONG TERM GOAL #3   Title Patient will demonstrate >/= 4+/5 MMT of left knee in order to initiate running progression  Baseline 4-/5 MMT left knee - 03/26/21    Time 6    Period Weeks    Status On-going    Target Date 04/30/21      PT LONG TERM GOAL #4   Title Patient will demonstrate >/= 4+/5 MMT hip strength to improve dynamic knee control with single leg tasks to progress to running    Baseline 4-/5 MMT - 03/26/21    Time 6    Period Weeks    Status On-going    Target Date 04/30/21      PT  LONG TERM GOAL #5   Title Patient will be able to perform single leg squat x10 without deviation to demonstrate appropriate motor control    Baseline unable to perform SL squat at this time - 03/26/21    Time 6    Period Weeks    Status On-going    Target Date 04/30/21      PT LONG TERM GOAL #6   Title Patient will report no increased pain or swelling with PT related activity or exercise in order to progress strength and to dynamic plyometric tasks    Baseline Patient reports soreness and occasional pain/swelling post with therapy and with extended periods of standing - 03/26/21    Time 6    Period Weeks    Status On-going    Target Date 04/30/21                   Plan - 04/14/21 0834     Clinical Impression Statement Patient tolerated therapy well with no adverse effects. Patient exhibits improvement with active knee hyperextension this visit and able to progress with quad strengthening on step down and leg press. Patient does continue to exhibit squat deviations to compensate for left quad weakness so encouraged her to ensure proper form at home. Patient would benefit from continued skilled PT to progress motion, strength, and back to function in order to return to activities such as valleyball.    PT Treatment/Interventions ADLs/Self Care Home Management;Electrical Stimulation;Ultrasound;Gait training;Stair training;Functional mobility training;Therapeutic activities;Therapeutic exercise;Patient/family education;Manual techniques;Passive range of motion;Scar mobilization;Taping    PT Next Visit Plan Progress HEP, work on ATKHE, quad stretch/knee flexion ROM, progress CKC strengthening as tolerated, dynamic balance activity    PT Home Exercise Plan [id number]    Consulted and Agree with Plan of Care Patient;Family member/caregiver    Family Member Consulted mother             Patient will benefit from skilled therapeutic intervention in order to improve the following deficits  and impairments:  Abnormal gait, Difficulty walking, Decreased safety awareness, Decreased activity tolerance, Decreased strength, Decreased mobility, Increased edema, Pain  Visit Diagnosis: Chronic pain of left knee  Stiffness of left knee, not elsewhere classified  Other abnormalities of gait and mobility  Rupture of anterior cruciate ligament of left knee, subsequent encounter     Problem List There are no problems to display for this patient.   Rosana Hoes, PT, DPT, LAT, ATC 04/14/21  9:59 AM Phone: [phone] Fax: [phone]   [facility] [address] [place], [place], [address] Phone: [phone]   Fax:  [phone]  Name: Cheryl Cole MRN: [id number] Date of Birth: 09/06/2001

## 2021-04-16 ENCOUNTER — Encounter: Payer: Self-pay | Admitting: Physical Therapy

## 2021-04-16 ENCOUNTER — Ambulatory Visit: Payer: PRIVATE HEALTH INSURANCE | Admitting: Physical Therapy

## 2021-04-16 ENCOUNTER — Other Ambulatory Visit: Payer: Self-pay

## 2021-04-16 DIAGNOSIS — G8929 Other chronic pain: Secondary | ICD-10-CM

## 2021-04-16 DIAGNOSIS — M25562 Pain in left knee: Secondary | ICD-10-CM | POA: Diagnosis not present

## 2021-04-16 DIAGNOSIS — S83512D Sprain of anterior cruciate ligament of left knee, subsequent encounter: Secondary | ICD-10-CM

## 2021-04-16 DIAGNOSIS — M25662 Stiffness of left knee, not elsewhere classified: Secondary | ICD-10-CM

## 2021-04-16 DIAGNOSIS — R2689 Other abnormalities of gait and mobility: Secondary | ICD-10-CM

## 2021-04-16 NOTE — Therapy (Signed)
[facility] Outpatient Rehabilitation [facility] [address] [place], [place], [address] Phone: [phone]   Fax:  [phone]  Physical Therapy Treatment  Patient Details  Name: Cheryl Blaker. Cole MRN: [id number] Date of Birth: 2001/07/11 Referring Provider (PT): DR Ramond Marrow   Encounter Date: 04/16/2021   PT End of Session - 04/16/21 0830     Visit Number 18    Number of Visits 24    Date for PT Re-Evaluation 04/30/21    Authorization Type UHC MCD    Authorization Time Period 04/02/21 - 05/01/21    Authorization - Visit Number 4    Authorization - Number of Visits 8    PT Start Time 0826    PT Stop Time 0912    PT Time Calculation (min) 46 min    Activity Tolerance Patient tolerated treatment well    Behavior During Therapy [facility] for tasks assessed/performed             Past Medical History:  Diagnosis Date   Torn meniscus     Past Surgical History:  Procedure Laterality Date   KNEE ARTHROSCOPY WITH ANTERIOR CRUCIATE LIGAMENT (ACL) REPAIR Left 02/05/2021   Procedure: KNEE ARTHROSCOPY WITH ANTERIOR CRUCIATE LIGAMENT (ACL) REPAIR WITH AUTOGRAFT;  Surgeon: Bjorn Pippin, MD;  Location: [facility];  Service: Orthopedics;  Laterality: Left;   KNEE ARTHROSCOPY WITH LATERAL MENISECTOMY  02/05/2021   Procedure: KNEE ARTHROSCOPY WITH LATERAL MENISECTOMY;  Surgeon: Bjorn Pippin, MD;  Location: [facility];  Service: Orthopedics;;   KNEE ARTHROSCOPY WITH MEDIAL MENISECTOMY Left 02/05/2021   Procedure: KNEE ARTHROSCOPY WITH  MEDIAL MENISCUS REPAIR;  Surgeon: Bjorn Pippin, MD;  Location: [facility];  Service: Orthopedics;  Laterality: Left;   NO PAST SURGERIES      There were no vitals filed for this visit.   Subjective Assessment - 04/16/21 0829     Subjective Patient reports no soreness following last visit, she continues to do well with exercises and stretching at home.    Patient Stated Goals Get back to previous level  of activity    Currently in Pain? No/denies                [facility] PT Assessment - 04/16/21 0001       AROM   Left Knee Flexion 128      PROM   Left Knee Flexion 134                           [facility] Adult PT Treatment/Exercise - 04/16/21 0001       Exercises   Exercises Knee/Hip      Knee/Hip Exercises: Stretches   Passive Hamstring Stretch 3 reps;30 seconds    Passive Hamstring Stretch Limitations supine with strap    Quad Stretch 3 reps;30 seconds    Quad Stretch Limitations prone with strap    Gastroc Stretch 3 reps;30 seconds    Gastroc Stretch Limitations slant board      Knee/Hip Exercises: Aerobic   Elliptical L5 R1 x 5 min while taking subjective      Knee/Hip Exercises: Standing   Terminal Knee Extension Limitations TRX lean back squat with TKE x 8   focus on squatting through deeper knee bend   Forward Step Up 3 sets   6 reps   Forward Step Up Limitations TRX assisted runner step-up sliding heel up/down step to focus on quad  Functional Squat Limitations 3 x 6 SL squat to 24"    Other Standing Knee Exercises Sled push/pull 90# x 3 lengths   focus on knee extension   Other Standing Knee Exercises Side stepping with PT resistance holding band x 3 lengths      Knee/Hip Exercises: Seated   Long Arc Quad 3 sets   6 reps   Long Arc Quad Weight 3 lbs.      Knee/Hip Exercises: Supine   Knee Flexion 3 sets;10 reps    Knee Flexion Limitations TRX bridge with hamstring curl      Knee/Hip Exercises: Prone   Other Prone Exercises TKE x 5 with 5 second                    PT Education - 04/16/21 0829     Education Details HEP    Person(s) Educated Patient    Methods Explanation;Demonstration;Verbal cues    Comprehension Verbalized understanding;Returned demonstration;Verbal cues required;Need further instruction              PT Short Term Goals - 03/26/21 1053       PT SHORT TERM GOAL #1   Title Patient will increase knee  flexion to 90 degrees    Baseline 120 - 03/26/21    Time 4    Period Weeks    Status Achieved    Target Date 03/07/21      PT SHORT TERM GOAL #2   Title Patient will demonstrate full extension    Baseline 0 deg knee extension AROM - 03/26/21    Time 4    Period Weeks    Status Achieved    Target Date 03/07/21      PT SHORT TERM GOAL #3   Title Patient will ambualte 300' without a device    Baseline no longer using AD with ambulation - 03/26/21    Time 4    Period Weeks    Status Achieved    Target Date 03/07/21               PT Long Term Goals - 03/26/21 1054       PT LONG TERM GOAL #1   Title Patient will be indeepdent with exercise program to promote quad strength and single leg stability    Baseline HEP continues to be updated and patient requires cueing for exercise technique - 03/26/21    Time 6    Period Weeks    Status On-going    Target Date 04/30/21      PT LONG TERM GOAL #2   Title Patient will demonstrate a 30 second left single leg stance in order to progress to dynamic single leg strength and stability    Baseline Patient able to maintain SLS > 30 sec bilaterally - 03/26/21    Time 6    Period Weeks    Status Achieved      PT LONG TERM GOAL #3   Title Patient will demonstrate >/= 4+/5 MMT of left knee in order to initiate running progression    Baseline 4-/5 MMT left knee - 03/26/21    Time 6    Period Weeks    Status On-going    Target Date 04/30/21      PT LONG TERM GOAL #4   Title Patient will demonstrate >/= 4+/5 MMT hip strength to improve dynamic knee control with single leg tasks to progress to running    Baseline 4-/5 MMT - 03/26/21  Time 6    Period Weeks    Status On-going    Target Date 04/30/21      PT LONG TERM GOAL #5   Title Patient will be able to perform single leg squat x10 without deviation to demonstrate appropriate motor control    Baseline unable to perform SL squat at this time - 03/26/21    Time 6    Period Weeks     Status On-going    Target Date 04/30/21      PT LONG TERM GOAL #6   Title Patient will report no increased pain or swelling with PT related activity or exercise in order to progress strength and to dynamic plyometric tasks    Baseline Patient reports soreness and occasional pain/swelling post with therapy and with extended periods of standing - 03/26/21    Time 6    Period Weeks    Status On-going    Target Date 04/30/21                   Plan - 04/16/21 0830     Clinical Impression Statement Patient tolerated therapy well with no adverse effects. Therapy continued focus on strengthening especially quad. Incorporated sled push-pull to encouraged qaud strengthening through full knee extension and progressed to SL squats this visit. Patient does continue to demonstrate gross left quad weakness but seems to be progressing well. Patient would benefit from continued skilled PT to progress motion, strength, and back to function in order to return to activities such as valleyball.    PT Treatment/Interventions ADLs/Self Care Home Management;Electrical Stimulation;Ultrasound;Gait training;Stair training;Functional mobility training;Therapeutic activities;Therapeutic exercise;Patient/family education;Manual techniques;Passive range of motion;Scar mobilization;Taping    PT Next Visit Plan Progress HEP, work on ATKHE, quad stretch/knee flexion ROM, progress CKC strengthening as tolerated, dynamic balance activity    PT Home Exercise Plan [email or web address]    Consulted and Agree with Plan of Care Patient;Family member/caregiver    Family Member Consulted mother             Patient will benefit from skilled therapeutic intervention in order to improve the following deficits and impairments:  Abnormal gait, Difficulty walking, Decreased safety awareness, Decreased activity tolerance, Decreased strength, Decreased mobility, Increased edema, Pain  Visit Diagnosis: Chronic pain of left knee  Stiffness  of left knee, not elsewhere classified  Other abnormalities of gait and mobility  Rupture of anterior cruciate ligament of left knee, subsequent encounter     Problem List There are no problems to display for this patient.   Rosana Hoes, PT, DPT, LAT, ATC 04/16/21  9:33 AM Phone: [phone] Fax: [phone]   [facility] Outpatient Rehabilitation [facility] [address] [place], [place], [address] Phone: [phone]   Fax:  [phone]  Name: Cheryl Cole MRN: [id number] Date of Birth: February 08, 2001

## 2021-04-21 ENCOUNTER — Other Ambulatory Visit: Payer: Self-pay

## 2021-04-21 ENCOUNTER — Encounter: Payer: Self-pay | Admitting: Physical Therapy

## 2021-04-21 ENCOUNTER — Ambulatory Visit: Payer: PRIVATE HEALTH INSURANCE | Admitting: Physical Therapy

## 2021-04-21 DIAGNOSIS — M25562 Pain in left knee: Secondary | ICD-10-CM | POA: Diagnosis not present

## 2021-04-21 DIAGNOSIS — M25662 Stiffness of left knee, not elsewhere classified: Secondary | ICD-10-CM

## 2021-04-21 DIAGNOSIS — G8929 Other chronic pain: Secondary | ICD-10-CM

## 2021-04-21 DIAGNOSIS — S83512D Sprain of anterior cruciate ligament of left knee, subsequent encounter: Secondary | ICD-10-CM

## 2021-04-21 DIAGNOSIS — R2689 Other abnormalities of gait and mobility: Secondary | ICD-10-CM

## 2021-04-21 NOTE — Therapy (Signed)
[facility] [address] [place], [place], [address] Phone: [phone]   Fax:  [phone]  Physical Therapy Treatment  Patient Details  Name: Cheryl Cole MRN: [id number] Date of Birth: 04-May-2001 Referring Provider (PT): DR Ramond Marrow   Encounter Date: 04/21/2021   PT End of Session - 04/21/21 0830     Visit Number 19    Number of Visits 24    Date for PT Re-Evaluation 04/30/21    Authorization Type U[employer] MCD    Authorization Time Period 04/02/21 - 05/01/21    Authorization - Visit Number 5    Authorization - Number of Visits 8    PT Start Time 0826    PT Stop Time 0912    PT Time Calculation (min) 46 min    Activity Tolerance Patient tolerated treatment well    Behavior During Therapy [facility] for tasks assessed/performed             Past Medical History:  Diagnosis Date   Torn meniscus     Past Surgical History:  Procedure Laterality Date   KNEE ARTHROSCOPY WITH ANTERIOR CRUCIATE LIGAMENT (ACL) REPAIR Left 02/05/2021   Procedure: KNEE ARTHROSCOPY WITH ANTERIOR CRUCIATE LIGAMENT (ACL) REPAIR WITH AUTOGRAFT;  Surgeon: Bjorn Pippin, MD;  Location: [facility];  Service: Orthopedics;  Laterality: Left;   KNEE ARTHROSCOPY WITH LATERAL MENISECTOMY  02/05/2021   Procedure: KNEE ARTHROSCOPY WITH LATERAL MENISECTOMY;  Surgeon: Bjorn Pippin, MD;  Location: [facility];  Service: Orthopedics;;   KNEE ARTHROSCOPY WITH MEDIAL MENISECTOMY Left 02/05/2021   Procedure: KNEE ARTHROSCOPY WITH  MEDIAL MENISCUS REPAIR;  Surgeon: Bjorn Pippin, MD;  Location: [facility];  Service: Orthopedics;  Laterality: Left;   NO PAST SURGERIES      There were no vitals filed for this visit.   Subjective Assessment - 04/21/21 0827     Subjective Patient reports she is doing well with no new issues. She did have a slight ache after last visit but resolved by the next day. 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    Patient Stated Goals Get back to previous level of activity    Currently in Pain? No/denies                               [facility] Adult PT Treatment/Exercise - 04/21/21 0001       Exercises   Exercises Knee/Hip      Knee/Hip Exercises: Stretches   Passive Hamstring Stretch 3 reps;30 seconds    Passive Hamstring Stretch Limitations supine with strap    Gastroc Stretch 3 reps;30 seconds    Gastroc Stretch Limitations slant board      Knee/Hip Exercises: Standing   Terminal Knee Extension Limitations TRX lean back squat with TKE 3 x 8    Forward Step Up 3 sets   8 reps   Forward Step Up Limitations TRX assisted runner step-up sliding heel up/down step to focus on quad    Functional Squat Limitations 3 x 6 SL squat to 22"   cues to allow knee to move anteriorly   Wall Squat 3 sets   30 seconds   SLS Airex with ball toss 3 x 30 sec    Other Standing Knee Exercises Goblet squat 15# with heels propped 3 x 8    Other Standing Knee Exercises Lateral band walk with blue  around ankle 3 x 20      Knee/Hip Exercises: Seated   Long Arc Quad 3 sets   8 reps   Long Arc Quad Weight 4 lbs.      Knee/Hip Exercises: Supine   Knee Flexion 3 sets;10 reps    Knee Flexion Limitations TRX bridge with hamstring curl                    PT Education - 04/21/21 0828     Education Details HEP    Person(s) Educated Patient    Methods Explanation;Demonstration;Verbal cues    Comprehension Verbalized understanding;Returned demonstration;Tactile cues required;Need further instruction              PT Short Term Goals - 03/26/21 1053       PT SHORT TERM GOAL #1   Title Patient will increase knee flexion to 90 degrees    Baseline 120 - 03/26/21    Time 4    Period Weeks    Status Achieved    Target Date 03/07/21      PT SHORT TERM GOAL #2   Title Patient will demonstrate full extension    Baseline 0 deg knee extension AROM - 03/26/21    Time 4    Period  Weeks    Status Achieved    Target Date 03/07/21      PT SHORT TERM GOAL #3   Title Patient will ambualte 300' without a device    Baseline no longer using AD with ambulation - 03/26/21    Time 4    Period Weeks    Status Achieved    Target Date 03/07/21               PT Long Term Goals - 03/26/21 1054       PT LONG TERM GOAL #1   Title Patient will be indeepdent with exercise program to promote quad strength and single leg stability    Baseline HEP continues to be updated and patient requires cueing for exercise technique - 03/26/21    Time 6    Period Weeks    Status On-going    Target Date 04/30/21      PT LONG TERM GOAL #2   Title Patient will demonstrate a 30 second left single leg stance in order to progress to dynamic single leg strength and stability    Baseline Patient able to maintain SLS > 30 sec bilaterally - 03/26/21    Time 6    Period Weeks    Status Achieved      PT LONG TERM GOAL #3   Title Patient will demonstrate >/= 4+/5 MMT of left knee in order to initiate running progression    Baseline 4-/5 MMT left knee - 03/26/21    Time 6    Period Weeks    Status On-going    Target Date 04/30/21      PT LONG TERM GOAL #4   Title Patient will demonstrate >/= 4+/5 MMT hip strength to improve dynamic knee control with single leg tasks to progress to running    Baseline 4-/5 MMT - 03/26/21    Time 6    Period Weeks    Status On-going    Target Date 04/30/21      PT LONG TERM GOAL #5   Title Patient will be able to perform single leg squat x10 without deviation to demonstrate appropriate motor control    Baseline unable to perform SL squat  at this time - 03/26/21    Time 6    Period Weeks    Status On-going    Target Date 04/30/21      PT LONG TERM GOAL #6   Title Patient will report no increased pain or swelling with PT related activity or exercise in order to progress strength and to dynamic plyometric tasks    Baseline Patient reports soreness and  occasional pain/swelling post with therapy and with extended periods of standing - 03/26/21    Time 6    Period Weeks    Status On-going    Target Date 04/30/21                   Plan - 04/21/21 4174     Clinical Impression Statement Patient tolerated therapy well with no adverse effects. Therapy continued focus on progress strength and quad control. Patient is tolerating increase in resistance and difficulty with exercises. She does require cueing for proper knee positioning to utilize quad, allowing knee to travel forward over toes. No pain reported with therapy. Patient would benefit from continued skilled PT to progress motion, strength, and back to function in order to return to activities such as valleyball.    PT Treatment/Interventions ADLs/Self Care Home Management;Electrical Stimulation;Ultrasound;Gait training;Stair training;Functional mobility training;Therapeutic activities;Therapeutic exercise;Patient/family education;Manual techniques;Passive range of motion;Scar mobilization;Taping    PT Next Visit Plan Progress HEP, work on ATKHE, quad stretch/knee flexion ROM, progress CKC strengthening as tolerated, dynamic balance activity    PT Home Exercise Plan [email or web address]    Consulted and Agree with Plan of Care Patient;Family member/caregiver    Family Member Consulted mother             Patient will benefit from skilled therapeutic intervention in order to improve the following deficits and impairments:  Abnormal gait, Difficulty walking, Decreased safety awareness, Decreased activity tolerance, Decreased strength, Decreased mobility, Increased edema, Pain  Visit Diagnosis: Chronic pain of left knee  Stiffness of left knee, not elsewhere classified  Other abnormalities of gait and mobility  Rupture of anterior cruciate ligament of left knee, subsequent encounter     Problem List There are no problems to display for this patient.   Rosana Hoes, PT, DPT, LAT,  ATC 04/21/21  9:12 AM Phone: [phone] Fax: [phone]   [facility] [address] [place], [place], [address] Phone: [phone]   Fax:  [phone]  Name: Cheryl Cole MRN: [id number] Date of Birth: [birth year]

## 2021-04-23 ENCOUNTER — Encounter: Payer: Self-pay | Admitting: Physical Therapy

## 2021-04-23 ENCOUNTER — Other Ambulatory Visit: Payer: Self-pay

## 2021-04-23 ENCOUNTER — Ambulatory Visit: Payer: PRIVATE HEALTH INSURANCE | Admitting: Physical Therapy

## 2021-04-23 DIAGNOSIS — M25562 Pain in left knee: Secondary | ICD-10-CM | POA: Diagnosis not present

## 2021-04-23 DIAGNOSIS — G8929 Other chronic pain: Secondary | ICD-10-CM

## 2021-04-23 DIAGNOSIS — S83512D Sprain of anterior cruciate ligament of left knee, subsequent encounter: Secondary | ICD-10-CM

## 2021-04-23 DIAGNOSIS — M25662 Stiffness of left knee, not elsewhere classified: Secondary | ICD-10-CM

## 2021-04-23 DIAGNOSIS — R2689 Other abnormalities of gait and mobility: Secondary | ICD-10-CM

## 2021-04-23 NOTE — Patient Instructions (Signed)
Access Code: [email or web address] URL: [email or web address] Date: 04/23/2021 Prepared by: Rosana Hoes  Exercises Prone Quadriceps Stretch with Strap - 2-3 x daily - 7 x weekly - 2 reps - 30 hold Supine Heel Slide with Strap - 2-3 x daily - 7 x weekly - 10 reps - 5 hold Long Sitting Quad Set - 2-3 x daily - 7 x weekly - 10 reps Active Straight Leg Raise with Quad Set - 1 x daily - 7 x weekly - 2 sets - 10 reps Prone Hamstring Curl with Anchored Resistance - 1 x daily - 4 x weekly - 3 sets - 10 reps Bridge with Hamstring Curl on Swiss Ball - 1 x daily - 4 x weekly - 3 sets - 6 reps Seated Hamstring Curl with Anchored Resistance - 1 x daily - 4 x weekly - 3 sets - 10 reps Gastroc Stretch on Wall - 1 x daily - 7 x weekly - 2 reps - 30 hold Soleus Stretch on Wall - 1 x daily - 7 x weekly - 2 reps - 30 hold Single Leg Bridge - 1 x daily - 5 x weekly - 2 sets - 10 reps Single Leg Heel Raise with Counter Support - 1 x daily - 7 x weekly - 2 sets - 10 reps Standing Terminal Knee Extension with Resistance - 1 x daily - 5 x weekly - 2 sets - 10 reps Single Leg Stance - 1 x daily - 7 x weekly - 3 sets - 30 sec hold Single Leg Cone Touch - 1 x daily - 7 x weekly - 3 sets - 30 hold Side Stepping with Resistance at Thighs - 1 x daily - 5 x weekly - 3 sets - 20 reps Goblet Squat with Kettlebell - 1 x daily - 5 x weekly - 3 sets - 10 reps Mini Lunge - 1 x daily - 5 x weekly - 2 sets - 10 reps Forward Step Down with Heel Tap and Counter Support - 1 x daily - 5 x weekly - 3 sets - 5 reps Single Leg Lunge with Foot on Bench - 1 x daily - 7 x weekly - 3 sets - 10 reps Seated Knee Extension with Resistance - 1 x daily - 7 x weekly - 3 sets - 10 reps

## 2021-04-23 NOTE — Therapy (Signed)
[facility] [address] [place], [place], [address] Phone: [phone]   Fax:  [phone]  Physical Therapy Treatment   Patient Details  Name: Cheryl Cole. [place] MRN: [id number] Date of Birth: 04/19/2001 Referring Provider (PT): DR Ramond Marrow   Encounter Date: 04/23/2021   PT End of Session - 04/23/21 0843     Visit Number 20    Number of Visits 24    Date for PT Re-Evaluation 04/30/21    Authorization Type UHC MCD    Authorization Time Period 04/02/21 - 05/01/21    Authorization - Visit Number 6    Authorization - Number of Visits 8    PT Start Time 0839    PT Stop Time 0927    PT Time Calculation (min) 48 min    Activity Tolerance Patient tolerated treatment well    Behavior During Therapy [facility] for tasks assessed/performed             Past Medical History:  Diagnosis Date   Torn meniscus     Past Surgical History:  Procedure Laterality Date   KNEE ARTHROSCOPY WITH ANTERIOR CRUCIATE LIGAMENT (ACL) REPAIR Left 02/05/2021   Procedure: KNEE ARTHROSCOPY WITH ANTERIOR CRUCIATE LIGAMENT (ACL) REPAIR WITH AUTOGRAFT;  Surgeon: Bjorn Pippin, MD;  Location: [facility];  Service: Orthopedics;  Laterality: Left;   KNEE ARTHROSCOPY WITH LATERAL MENISECTOMY  02/05/2021   Procedure: KNEE ARTHROSCOPY WITH LATERAL MENISECTOMY;  Surgeon: Bjorn Pippin, MD;  Location: [facility];  Service: Orthopedics;;   KNEE ARTHROSCOPY WITH MEDIAL MENISECTOMY Left 02/05/2021   Procedure: KNEE ARTHROSCOPY WITH  MEDIAL MENISCUS REPAIR;  Surgeon: Bjorn Pippin, MD;  Location: [facility];  Service: Orthopedics;  Laterality: Left;   NO PAST SURGERIES      There were no vitals filed for this visit.   Subjective Assessment - 04/23/21 0842     Subjective Patient reports she is doing well, no pain recently. States she felt good following last visit even with increased walking at camp.    Patient Stated Goals Get  back to previous level of activity    Currently in Pain? No/denies                [facility] PT Assessment - 04/23/21 0001       AROM   Right Knee Flexion 135    Left Knee Flexion 131                           [facility] Adult PT Treatment/Exercise - 04/23/21 0001       Exercises   Exercises Knee/Hip      Knee/Hip Exercises: Stretches   Passive Hamstring Stretch 3 reps;30 seconds    Passive Hamstring Stretch Limitations seated edge of mat with heel propped in chair    Gastroc Stretch 2 reps;30 seconds    Gastroc Stretch Limitations slant board      Knee/Hip Exercises: Aerobic   Elliptical L5 R1 x 5 min while taking subjective      Knee/Hip Exercises: Standing   Terminal Knee Extension 10 reps    Theraband Level (Terminal Knee Extension) Level 3 (Green)    Functional Squat Limitations Rear foot elevated split squat 3 x 8   2nd and 3rd set with 5# ipsilatera, contralateral UE support on FR   Other Standing Knee Exercises Goblet squat 15# with heels propped 2 x 8  Knee/Hip Exercises: Seated   Long Arc Quad 3 sets;10 reps    Long Arc Quad Weight 4 lbs.   first 2 sets   [place] Limitations 3rd set with green to demonstrate for HEP      Knee/Hip Exercises: Prone   Hamstring Curl 3 sets;10 reps    Hamstring Curl Limitations green    Other Prone Exercises TKE x 10    Other Prone Exercises Quadruped TKE with green x 10                    PT Education - 04/23/21 0842     Education Details HEP update    Person(s) Educated Patient    Methods Explanation;Demonstration;Verbal cues;Handout    Comprehension Verbalized understanding;Returned demonstration;Verbal cues required;Need further instruction              PT Short Term Goals - 03/26/21 1053       PT SHORT TERM GOAL #1   Title Patient will increase knee flexion to 90 degrees    Baseline 120 - 03/26/21    Time 4    Period Weeks    Status Achieved    Target Date 03/07/21       PT SHORT TERM GOAL #2   Title Patient will demonstrate full extension    Baseline 0 deg knee extension AROM - 03/26/21    Time 4    Period Weeks    Status Achieved    Target Date 03/07/21      PT SHORT TERM GOAL #3   Title Patient will ambualte 300' without a device    Baseline no longer using AD with ambulation - 03/26/21    Time 4    Period Weeks    Status Achieved    Target Date 03/07/21               PT Long Term Goals - 04/23/21 0938       PT LONG TERM GOAL #1   Title Patient will be indeepdent with exercise program to promote quad strength and single leg stability    Baseline HEP continues to be updated and patient requires cueing for exercise technique - 04/23/21    Time 6    Period Weeks    Status On-going      PT LONG TERM GOAL #2   Title Patient will demonstrate a 30 second left single leg stance in order to progress to dynamic single leg strength and stability    Baseline Patient able to maintain SLS > 30 sec bilaterally - 03/26/21    Time 6    Period Weeks    Status Achieved      PT LONG TERM GOAL #3   Title Patient will demonstrate >/= 4+/5 MMT of left knee in order to initiate running progression    Baseline 4-/5 MMT left knee - 04/23/21    Time 6    Period Weeks    Status On-going      PT LONG TERM GOAL #4   Title Patient will demonstrate >/= 4+/5 MMT hip strength to improve dynamic knee control with single leg tasks to progress to running    Baseline 4-/5 MMT - 04/23/21    Time 6    Period Weeks    Status On-going      PT LONG TERM GOAL #5   Title Patient will be able to perform single leg squat x10 without deviation to demonstrate appropriate motor control  Baseline progressing with SL squat - 04/23/21    Time 6    Period Weeks    Status On-going      PT LONG TERM GOAL #6   Title Patient will report no increased pain or swelling with PT related activity or exercise in order to progress strength and to dynamic plyometric tasks    Baseline  Patient denies any soreness or swelling following PT or activity at this time - 04/23/21    Time 6    Period Weeks    Status On-going                   Plan - 04/23/21 0844     Clinical Impression Statement Patient tolerated therapy well with no adverse effects. Continued focus on progressing strength with good tolerance. Patient does exhibit improved knee flexion this visit and is just shy of equal to opposite side. She continues to exhibit gross quad and hamstring weakness of left side so progressed HEP to focus more on strengthening. Patient would benefit from continued skilled PT to progress motion, strength, and back to function in order to return to activities such as valleyball.    PT Treatment/Interventions ADLs/Self Care Home Management;Electrical Stimulation;Ultrasound;Gait training;Stair training;Functional mobility training;Therapeutic activities;Therapeutic exercise;Patient/family education;Manual techniques;Passive range of motion;Scar mobilization;Taping    PT Next Visit Plan Progress HEP, work on ATKHE, quad stretch/knee flexion ROM, progress CKC strengthening as tolerated, dynamic balance activity    PT Home Exercise Plan [email or web address]    Consulted and Agree with Plan of Care Patient;Family member/caregiver    Family Member Consulted mother             Patient will benefit from skilled therapeutic intervention in order to improve the following deficits and impairments:  Abnormal gait, Difficulty walking, Decreased safety awareness, Decreased activity tolerance, Decreased strength, Decreased mobility, Increased edema, Pain  Visit Diagnosis: Chronic pain of left knee  Stiffness of left knee, not elsewhere classified  Other abnormalities of gait and mobility  Rupture of anterior cruciate ligament of left knee, subsequent encounter     Problem List There are no problems to display for this patient.   Rosana Hoes, PT, DPT, LAT, ATC 04/23/21  9:41 AM Phone:  [phone] Fax: [phone]   [facility] [address] [place], [place], [address] Phone: [phone]   Fax:  [phone]  Name: Jocabed Cheese. [place] MRN: [id number] Date of Birth: 17-Jul-2001

## 2021-04-28 ENCOUNTER — Other Ambulatory Visit: Payer: Self-pay

## 2021-04-28 ENCOUNTER — Encounter: Payer: Self-pay | Admitting: Physical Therapy

## 2021-04-28 ENCOUNTER — Ambulatory Visit: Payer: PRIVATE HEALTH INSURANCE | Admitting: Physical Therapy

## 2021-04-28 DIAGNOSIS — M25662 Stiffness of left knee, not elsewhere classified: Secondary | ICD-10-CM

## 2021-04-28 DIAGNOSIS — S83512D Sprain of anterior cruciate ligament of left knee, subsequent encounter: Secondary | ICD-10-CM

## 2021-04-28 DIAGNOSIS — G8929 Other chronic pain: Secondary | ICD-10-CM

## 2021-04-28 DIAGNOSIS — R2689 Other abnormalities of gait and mobility: Secondary | ICD-10-CM

## 2021-04-28 DIAGNOSIS — M25562 Pain in left knee: Secondary | ICD-10-CM | POA: Diagnosis not present

## 2021-04-28 NOTE — Patient Instructions (Signed)
Access Code: [email or web address] URL: [email or web address] Date: 04/28/2021 Prepared by: Rosana Hoes  Exercises Prone Hamstring Curl with Anchored Resistance - 1 x daily - 4 x weekly - 3 sets - 10 reps Bridge with Hamstring Curl on Swiss Ball - 1 x daily - 4 x weekly - 3 sets - 6 reps Seated Hamstring Curl with Anchored Resistance - 1 x daily - 4 x weekly - 3 sets - 10 reps Gastroc Stretch on Wall - 1 x daily - 7 x weekly - 2 reps - 30 hold Soleus Stretch on Wall - 1 x daily - 7 x weekly - 2 reps - 30 hold Single Leg Bridge - 1 x daily - 5 x weekly - 2 sets - 10 reps Single Leg Heel Raise with Counter Support - 1 x daily - 7 x weekly - 2 sets - 10 reps Standing Terminal Knee Extension with Resistance - 1 x daily - 5 x weekly - 2 sets - 10 reps Single Leg Stance - 1 x daily - 7 x weekly - 3 sets - 30 sec hold Single Leg Cone Touch - 1 x daily - 7 x weekly - 3 sets - 30 hold Side Stepping with Resistance at Thighs - 1 x daily - 5 x weekly - 3 sets - 20 reps Goblet Squat with Kettlebell - 1 x daily - 5 x weekly - 3 sets - 10 reps Mini Lunge - 1 x daily - 5 x weekly - 2 sets - 10 reps Forward Step Down with Heel Tap and Counter Support - 1 x daily - 5 x weekly - 3 sets - 5 reps Single Leg Lunge with Foot on Bench - 1 x daily - 5 x weekly - 3 sets - 10 reps Seated Knee Extension with Resistance - 1 x daily - 5 x weekly - 3 sets - 10 reps Squat Jumps - 1 x daily - 2-3 x weekly - 2 sets - 10 reps Forward Tape Jumps - 1 x daily - 2-3 x weekly - 2 sets - 10 reps Sideways Tape Jumps - 1 x daily - 2-3 x weekly - 2 sets - 10 reps

## 2021-04-28 NOTE — Therapy (Signed)
[facility] [address] [place], [place], [address] Phone: [phone]   Fax:  [phone]  Physical Therapy Treatment / ERO  Patient Details  Name: Cheryl Cole MRN: [id number] Date of Birth: [birth year] Referring Provider (PT): DR Ramond Marrowax Varkey   Encounter Date: 04/28/2021   PT End of Session - 04/28/21 0829     Visit Number 21    Number of Visits 29    Date for PT Re-Evaluation 06/23/21    Authorization Type UHC MCD    Authorization Time Period 04/02/21 - 05/01/21 - resubmitted 04/28/21    Authorization - Visit Number 7    Authorization - Number of Visits 8    PT Start Time 0826    PT Stop Time 0915    PT Time Calculation (min) 49 min    Activity Tolerance Patient tolerated treatment well    Behavior During Therapy [facility] for tasks assessed/performed             Past Medical History:  Diagnosis Date   Torn meniscus     Past Surgical History:  Procedure Laterality Date   KNEE ARTHROSCOPY WITH ANTERIOR CRUCIATE LIGAMENT (ACL) REPAIR Left 02/05/2021   Procedure: KNEE ARTHROSCOPY WITH ANTERIOR CRUCIATE LIGAMENT (ACL) REPAIR WITH AUTOGRAFT;  Surgeon: Bjorn PippinVarkey, Dax T, MD;  Location: [facility];  Service: Orthopedics;  Laterality: Left;   KNEE ARTHROSCOPY WITH LATERAL MENISECTOMY  02/05/2021   Procedure: KNEE ARTHROSCOPY WITH LATERAL MENISECTOMY;  Surgeon: Bjorn PippinVarkey, Dax T, MD;  Location: [facility];  Service: Orthopedics;;   KNEE ARTHROSCOPY WITH MEDIAL MENISECTOMY Left 02/05/2021   Procedure: KNEE ARTHROSCOPY WITH  MEDIAL MENISCUS REPAIR;  Surgeon: Bjorn PippinVarkey, Dax T, MD;  Location: [facility];  Service: Orthopedics;  Laterality: Left;   NO PAST SURGERIES      There were no vitals filed for this visit.   Subjective Assessment - 04/28/21 0827     Subjective Patient reports she is doing well. No new issues regarding her knee, no pain or soreness or swelling currently. She feels her exercises  are getting easier and she is getting stronger.    Patient Stated Goals Get back to previous level of activity    Currently in Pain? No/denies                [facility] - 04/28/21 0001       Assessment   Medical Diagnosis L ACL with BTB graft and Medial meniscal reapair    Referring Provider (PT) DR Ramond Marrowax Varkey    Onset Date/Surgical Date 02/05/21      Precautions   Precautions None      Restrictions   Weight Bearing Restrictions No      Balance Screen   Has the patient fallen in the past 6 months No      Prior Function   Level of Independence Independent    Leisure Volleyball      Cognition   Overall Cognitive Status Within Functional Limits for tasks assessed      Observation/Other Assessments   Focus on Therapeutic Outcomes (FOTO)  NA - MCD    Other Surveys  Lower Extremity Functional Scale    Lower Extremity Functional Scale  60/80      Functional Tests   Functional tests Single Leg Squat      Single Leg Squat   Comments Patient exhibits decreased depth and impaired dynamic/eccentric control      AROM   Left Knee  Extension 4   hyper   Left Knee Flexion 134      Strength   Left Hip Flexion 4/5    Left Hip Extension 4/5    Left Hip ABduction 4/5    Left Knee Flexion 4/5    Left Knee Extension 4/5                           [facility] Adult PT Treatment/Exercise - 04/28/21 0001       Self-Care   Self-Care Other Self-Care Comments    Other Self-Care Comments  Exam findings and progress toward goals, LEFS, POC update, HEP update, plyometrics      Exercises   Exercises Knee/Hip      Knee/Hip Exercises: Stretches   Passive Hamstring Stretch 2 reps;30 seconds    Passive Hamstring Stretch Limitations supine with strap    Gastroc Stretch 2 reps;30 seconds    Gastroc Stretch Limitations slant board      Knee/Hip Exercises: Machines for Strengthening   Cybex Knee Flexion DL: 81# 3 x 10    Cybex Leg Press DL: 275# x 8, 170# x 8,  017# 2 x 6      Knee/Hip Exercises: Plyometrics   Bilateral Jumping Limitations Stationary vertical jump 2 x 10, fwd/bwd jump over line 2 x10, lateral jump over line 2 x 10      Knee/Hip Exercises: Standing   Step Down 2 sets;5 reps    Step Down Limitations TRX assisted forward heel tap on 6" box    Functional Squat Limitations Rear foot elevated split squat 3 x 8 each   5# ipsilateral, contralateral UE support on FR   SLS Airex with ball toss 3 x 30 sec      Knee/Hip Exercises: Seated   Long Arc Quad 3 sets;10 reps    Long Arc Quad Weight 4 lbs.                    PT Education - 04/28/21 4944     Education Details Exam findings and progress toward goals, LEFS, POC update, HEP update, plyometrics    Person(s) Educated Patient    Methods Explanation;Demonstration;Verbal cues;Handout    Comprehension Verbalized understanding;Returned demonstration;Verbal cues required;Need further instruction              PT Short Term Goals - 03/26/21 1053       PT SHORT TERM GOAL #1   Title Patient will increase knee flexion to 90 degrees    Baseline 120 - 03/26/21    Time 4    Period Weeks    Status Achieved    Target Date 03/07/21      PT SHORT TERM GOAL #2   Title Patient will demonstrate full extension    Baseline 0 deg knee extension AROM - 03/26/21    Time 4    Period Weeks    Status Achieved    Target Date 03/07/21      PT SHORT TERM GOAL #3   Title Patient will ambualte 300' without a device    Baseline no longer using AD with ambulation - 03/26/21    Time 4    Period Weeks    Status Achieved    Target Date 03/07/21               PT Long Term Goals - 04/28/21 0830       PT LONG TERM GOAL #1   Title  Patient will be indeepdent with exercise program to promote quad strength and single leg stability    Baseline HEP continues to be updated and patient requires cueing for exercise technique - 04/23/21    Time 8    Period Weeks    Status On-going     Target Date 06/23/21      PT LONG TERM GOAL #2   Title Patient will demonstrate a 30 second left single leg stance in order to progress to dynamic single leg strength and stability    Baseline Patient able to maintain SLS > 30 sec bilaterally - 03/26/21    Time 6    Period Weeks    Status Achieved      PT LONG TERM GOAL #3   Title Patient will demonstrate >/= 4+/5 MMT of left knee in order to initiate running progression    Baseline grossly 4/5 MMT left knee - 04/28/21    Time 8    Period Weeks    Status On-going    Target Date 06/23/21      PT LONG TERM GOAL #4   Title Patient will demonstrate >/= 4+/5 MMT hip strength to improve dynamic knee control with single leg tasks to progress to running    Baseline grossly 4/5 MMT - 04/28/21    Time 8    Period Weeks    Status On-going    Target Date 06/23/21      PT LONG TERM GOAL #5   Title Patient will be able to perform single leg squat x10 without deviation to demonstrate appropriate motor control    Baseline progressing with SL squat, continues to exhibit limitation with depth and control - 04/28/21    Time 8    Period Weeks    Status On-going    Target Date 06/23/21      PT LONG TERM GOAL #6   Title Patient will report no increased pain or swelling with PT related activity or exercise in order to progress strength and to dynamic plyometric tasks    Baseline Patient denies any soreness or swelling following PT or activity at this time - 04/28/21    Time 6    Period Weeks    Status Achieved      PT LONG TERM GOAL #7   Title Patient will be able to run at self selected speed and without limitation or increase in pain in order to return to active lifestyle and exercise    Baseline Patient has not initiated running at this time - 04/28/21    Time 8    Period Weeks    Status New    Target Date 06/23/21      PT LONG TERM GOAL #8   Title Patient will report >/= 79/80 on LEFS to indicate return to prior level of functional activity     Baseline LEFS 60/80 - 04/28/21    Time 8    Period Weeks    Status New    Target Date 06/23/21                   Plan - 04/28/21 0859     Clinical Impression Statement Patient tolerated therapy well with no adverse effects. Patient demonstrates continued progress toward LTGs consisting of improved left knee range of motion and strength. Patient was able to initiate light DL plyometric exercises this visit, to begin preparation for return to running program. Currently patient has not initiated running due to strength deficit but would  like to return to running to promote active lifestyle eand exercise so new LTG made for this. Patient also completed LEFS this visit which showed functional deficits so new LTG made for improve in function on LEFS as well. Patient would benefit from continued skilled PT to progress motion, strength, and back to function in order to return to activities such as valleyball.    PT Frequency 1x / week    PT Duration 8 weeks    PT Treatment/Interventions ADLs/Self Care Home Management;Electrical Stimulation;Ultrasound;Gait training;Stair training;Functional mobility training;Therapeutic activities;Therapeutic exercise;Patient/family education;Manual techniques;Passive range of motion;Scar mobilization;Taping    PT Next Visit Plan Progress HEP, work on ATKHE, quad stretch/knee flexion ROM, progress CKC strengthening as tolerated, dynamic balance activity    PT Home Exercise Plan [email or web address]    Consulted and Agree with Plan of Care Patient             Patient will benefit from skilled therapeutic intervention in order to improve the following deficits and impairments:  Abnormal gait, Difficulty walking, Decreased safety awareness, Decreased activity tolerance, Decreased strength, Decreased mobility, Increased edema, Pain  Visit Diagnosis: Chronic pain of left knee  Stiffness of left knee, not elsewhere classified  Other abnormalities of gait and  mobility  Rupture of anterior cruciate ligament of left knee, subsequent encounter     Problem List There are no problems to display for this patient.   Rosana Hoes, PT, DPT, LAT, ATC 04/28/21  9:58 AM Phone: [phone] Fax: [phone]   [facility] [address] [place], [place], [address] Phone: [phone]   Fax:  [phone]  Name: Cheryl Cole MRN: [id number] Date of Birth: 2001/03/25   Check all possible CPT codes: 08676- Therapeutic Exercise, [phone]- Neuro Re-education, [phone] - Gait Training, [phone] - Manual Therapy, 97530 - Therapeutic Activities, [phone] - Self Care, [phone] - Electrical stimulation (unattended), [id number] - Iontophoresis, [id number] - Ultrasound, [id number] - Vaso, and [id number] - Aquatic therapy

## 2021-05-08 ENCOUNTER — Ambulatory Visit: Payer: PRIVATE HEALTH INSURANCE | Admitting: Physical Therapy

## 2021-05-15 ENCOUNTER — Ambulatory Visit: Payer: PRIVATE HEALTH INSURANCE | Attending: Orthopaedic Surgery | Admitting: Physical Therapy

## 2021-05-15 ENCOUNTER — Other Ambulatory Visit: Payer: Self-pay

## 2021-05-15 ENCOUNTER — Encounter: Payer: Self-pay | Admitting: Physical Therapy

## 2021-05-15 DIAGNOSIS — S83512D Sprain of anterior cruciate ligament of left knee, subsequent encounter: Secondary | ICD-10-CM | POA: Diagnosis present

## 2021-05-15 DIAGNOSIS — G8929 Other chronic pain: Secondary | ICD-10-CM

## 2021-05-15 DIAGNOSIS — R2689 Other abnormalities of gait and mobility: Secondary | ICD-10-CM | POA: Insufficient documentation

## 2021-05-15 DIAGNOSIS — M25662 Stiffness of left knee, not elsewhere classified: Secondary | ICD-10-CM | POA: Diagnosis present

## 2021-05-15 DIAGNOSIS — M25562 Pain in left knee: Secondary | ICD-10-CM | POA: Insufficient documentation

## 2021-05-15 NOTE — Therapy (Signed)
[facility] [address] [place], [place], [address] Phone: [phone]   Fax:  [phone]  Physical Therapy Re-eval  Patient Details  Name: Cheryl Cole MRN: [id number] Date of Birth: 2000/12/02 Referring Provider (PT): DR Ramond Marrow   Encounter Date: 05/15/2021   PT End of Session - 05/15/21 0833     Visit Number 22    Number of Visits 29    Date for PT Re-Evaluation 06/23/21    Authorization Type UHC MCD    Authorization Time Period resubmitted 05/15/21    PT Start Time 0828    PT Stop Time 0915    PT Time Calculation (min) 47 min    Activity Tolerance Patient tolerated treatment well    Behavior During Therapy [facility] for tasks assessed/performed             Past Medical History:  Diagnosis Date   Torn meniscus     Past Surgical History:  Procedure Laterality Date   KNEE ARTHROSCOPY WITH ANTERIOR CRUCIATE LIGAMENT (ACL) REPAIR Left 02/05/2021   Procedure: KNEE ARTHROSCOPY WITH ANTERIOR CRUCIATE LIGAMENT (ACL) REPAIR WITH AUTOGRAFT;  Surgeon: Bjorn Pippin, MD;  Location: [facility];  Service: Orthopedics;  Laterality: Left;   KNEE ARTHROSCOPY WITH LATERAL MENISECTOMY  02/05/2021   Procedure: KNEE ARTHROSCOPY WITH LATERAL MENISECTOMY;  Surgeon: Bjorn Pippin, MD;  Location: [facility];  Service: Orthopedics;;   KNEE ARTHROSCOPY WITH MEDIAL MENISECTOMY Left 02/05/2021   Procedure: KNEE ARTHROSCOPY WITH  MEDIAL MENISCUS REPAIR;  Surgeon: Bjorn Pippin, MD;  Location: [facility];  Service: Orthopedics;  Laterality: Left;   NO PAST SURGERIES      There were no vitals filed for this visit.   Subjective Assessment - 05/15/21 0831     Subjective Patient reports she is doing well with no new issues. She states she did a lot of walking and steps while away which caused her knee to feel tired by reports no pain. She states she has been working on her hopping and it is going well.     Patient Stated Goals Get back to previous level of activity    Currently in Pain? No/denies                [facility] PT Assessment - 05/15/21 0001       Assessment   Medical Diagnosis L ACL with BTB graft and Medial meniscal reapair    Referring Provider (PT) DR Ramond Marrow    Onset Date/Surgical Date 02/05/21      Precautions   Precautions None      Restrictions   Weight Bearing Restrictions No      Balance Screen   Has the patient fallen in the past 6 months No      Prior Function   Level of Independence Independent    Leisure Volleyball      Cognition   Overall Cognitive Status Within Functional Limits for tasks assessed      Observation/Other Assessments   Focus on Therapeutic Outcomes (FOTO)  NA - MCD    Other Surveys  Lower Extremity Functional Scale    Lower Extremity Functional Scale  60/80      Functional Tests   Functional tests Single Leg Squat      Single Leg Squat   Comments Patient exhibits decreased depth and impaired dynamic/eccentric control      AROM   Left Knee Extension 4   hyper  Left Knee Flexion 134      Strength   Left Hip Flexion 4/5    Left Hip Extension 4/5    Left Hip ABduction 4/5    Left Knee Flexion 4/5    Left Knee Extension 4/5                           [facility] Adult PT Treatment/Exercise - 05/15/21 0001       Exercises   Exercises Knee/Hip      Knee/Hip Exercises: Stretches   Passive Hamstring Stretch 2 reps;30 seconds    Passive Hamstring Stretch Limitations supine with strap    Gastroc Stretch 2 reps;30 seconds    Gastroc Stretch Limitations slant board      Knee/Hip Exercises: Aerobic   Elliptical L5 R5 x 5 min while taking subjective      Knee/Hip Exercises: Machines for Strengthening   Cybex Knee Extension SL 15# 3 x 6    Cybex Leg Press SL: left 40# x 8, 60# 3 x 6; right: 100# 4 x 6      Knee/Hip Exercises: Plyometrics   Bilateral Jumping Limitations Stationary vertical jump x 10,  fwd/bwd POGO jump over line 2 x 10, lateral POGO jump over line 2 x 10    Unilateral Jumping Limitations Rear foot elevated SL hop 2 x 10, fwd hop onto left with FR for cue to let knee travel fwd 2 x 5, lateral alternating hop 2 x 5, fwd alternating hop 2 x 10, stationary POGO hop 2 x 10      Knee/Hip Exercises: Standing   SLS SLS on BOSU 3 x 30 sec      Knee/Hip Exercises: Supine   Other Supine Knee/Hip Exercises Bridge with SL eccentric heel slide with 25# over hips 3 x 8                    PT Education - 05/15/21 2197     Education Details Exam findings and progress toward goals for MCD reauth, LEFS, HEP update, plyometric progression    Person(s) Educated Patient    Methods Explanation;Demonstration;Verbal cues;Handout    Comprehension Verbalized understanding;Returned demonstration;Verbal cues required;Need further instruction              PT Short Term Goals - 03/26/21 1053       PT SHORT TERM GOAL #1   Title Patient will increase knee flexion to 90 degrees    Baseline 120 - 03/26/21    Time 4    Period Weeks    Status Achieved    Target Date 03/07/21      PT SHORT TERM GOAL #2   Title Patient will demonstrate full extension    Baseline 0 deg knee extension AROM - 03/26/21    Time 4    Period Weeks    Status Achieved    Target Date 03/07/21      PT SHORT TERM GOAL #3   Title Patient will ambualte 300' without a device    Baseline no longer using AD with ambulation - 03/26/21    Time 4    Period Weeks    Status Achieved    Target Date 03/07/21               PT Long Term Goals - 05/15/21 0835       PT LONG TERM GOAL #1   Title Patient will be indeepdent with exercise program to  promote quad strength and single leg stability    Baseline HEP continues to be updated and patient requires cueing for exercise technique    Time 8    Period Weeks    Status On-going    Target Date 06/23/21      PT LONG TERM GOAL #2   Title Patient will  demonstrate a 30 second left single leg stance in order to progress to dynamic single leg strength and stability    Baseline Patient able to maintain SLS > 30 sec bilaterally - 03/26/21    Time 6    Period Weeks    Status Achieved    Target Date 06/23/21      PT LONG TERM GOAL #3   Title Patient will demonstrate >/= 4+/5 MMT of left knee in order to initiate running progression    Baseline grossly 4/5 MMT left knee    Time 8    Period Weeks    Status On-going    Target Date 06/23/21      PT LONG TERM GOAL #4   Title Patient will demonstrate >/= 4+/5 MMT hip strength to improve dynamic knee control with single leg tasks to progress to running    Baseline grossly 4/5 MMT - 04/28/21    Time 8    Period Weeks    Status On-going    Target Date 06/23/21      PT LONG TERM GOAL #5   Title Patient will be able to perform single leg squat x10 without deviation to demonstrate appropriate motor control    Baseline progressing with SL squat, continues to exhibit limitation with depth and control    Time 8    Period Weeks    Status On-going    Target Date 06/23/21      PT LONG TERM GOAL #6   Title Patient will report no increased pain or swelling with PT related activity or exercise in order to progress strength and to dynamic plyometric tasks    Baseline Patient denies any soreness or swelling following PT or activity at this time - 04/28/21    Time 6    Period Weeks    Status Achieved    Target Date 06/23/21      PT LONG TERM GOAL #7   Title Patient will be able to run at self selected speed and without limitation or increase in pain in order to return to active lifestyle and exercise    Baseline Patient has not initiated running at this time but has begun to progress plyometric exercises    Time 8    Period Weeks    Status New    Target Date 06/23/21      PT LONG TERM GOAL #8   Title Patient will report >/= 79/80 on LEFS to indicate return to prior level of functional activity     Baseline LEFS 60/80    Time 8    Period Weeks    Status New    Target Date 06/23/21                   Plan - 05/15/21 [id number]     Clinical Impression Statement Patient tolerated therapy well with no adverse effects. Patient is progressing well toward LTGs, demonstrating improved strength and functional ability. She continues to demonstrate limitations in left knee stregth and control which limit SL squat control and return to running. She is progressing with plyometric exercises, initiating SL plyometric this visit. She does demonstrate  stiffer landing on left due to decreased eccentric quad control. Overall patient is progressing following ALC reconstruction as expected and would benefit from continued skilled PT to progress motion, strength, and back to function in order to return to activities such as valleyball.    PT Frequency 1x / week    PT Duration 8 weeks    PT Treatment/Interventions ADLs/Self Care Home Management;Electrical Stimulation;Ultrasound;Gait training;Stair training;Functional mobility training;Therapeutic activities;Therapeutic exercise;Patient/family education;Manual techniques;Passive range of motion;Scar mobilization;Taping    PT Next Visit Plan Progress HEP, work on ATKHE, progress strengthening as tolerated, dynamic balance activity, progress plyometric to SL    PT Home Exercise Plan [email or web address]    Consulted and Agree with Plan of Care Patient             Patient will benefit from skilled therapeutic intervention in order to improve the following deficits and impairments:  Abnormal gait, Difficulty walking, Decreased safety awareness, Decreased activity tolerance, Decreased strength, Decreased mobility, Increased edema, Pain  Visit Diagnosis: Chronic pain of left knee  Stiffness of left knee, not elsewhere classified  Other abnormalities of gait and mobility  Rupture of anterior cruciate ligament of left knee, subsequent encounter     Problem  List There are no problems to display for this patient.   Rosana Hoes, PT, DPT, LAT, ATC 05/15/21  9:24 AM Phone: [phone] Fax: [phone]   [facility] Outpatient Rehabilitation [facility] [address] [place], [place], [address] Phone: [phone]   Fax:  [phone]  Name: Cheryl Cole MRN: [id number] Date of Birth: Feb 05, 2001    Check all possible CPT codes: 51884- Therapeutic Exercise, [phone]- Neuro Re-education, [phone] - Gait Training, [phone] - Manual Therapy, [id number] - Therapeutic Activities, [phone] - Self Care, [phone] - Electrical stimulation (Manual), [id number] - Iontophoresis, [id number] - Ultrasound, [id number] - Vaso, and [id number] - Aquatic therapy

## 2021-05-15 NOTE — Patient Instructions (Signed)
Access Code: [email or web address] URL: [email or web address] Date: 05/15/2021 Prepared by: Rosana Hoes  Exercises Prone Hamstring Curl with Anchored Resistance - 1 x daily - 4 x weekly - 3 sets - 10 reps Bridge with Hamstring Curl on Swiss Ball - 1 x daily - 4 x weekly - 3 sets - 6 reps Seated Hamstring Curl with Anchored Resistance - 1 x daily - 4 x weekly - 3 sets - 10 reps Gastroc Stretch on Wall - 1 x daily - 7 x weekly - 2 reps - 30 hold Soleus Stretch on Wall - 1 x daily - 7 x weekly - 2 reps - 30 hold Single Leg Bridge - 1 x daily - 5 x weekly - 2 sets - 10 reps Single Leg Heel Raise with Counter Support - 1 x daily - 7 x weekly - 2 sets - 10 reps Standing Terminal Knee Extension with Resistance - 1 x daily - 5 x weekly - 2 sets - 10 reps Single Leg Stance - 1 x daily - 7 x weekly - 3 sets - 30 sec hold Single Leg Cone Touch - 1 x daily - 7 x weekly - 3 sets - 30 hold Side Stepping with Resistance at Thighs - 1 x daily - 5 x weekly - 3 sets - 20 reps Goblet Squat with Kettlebell - 1 x daily - 5 x weekly - 3 sets - 10 reps Mini Lunge - 1 x daily - 5 x weekly - 2 sets - 10 reps Forward Step Down with Heel Tap and Counter Support - 1 x daily - 5 x weekly - 3 sets - 5 reps Single Leg Lunge with Foot on Bench - 1 x daily - 5 x weekly - 3 sets - 10 reps Seated Knee Extension with Resistance - 1 x daily - 5 x weekly - 3 sets - 10 reps Squat Jumps - 1 x daily - 2-3 x weekly - 2 sets - 10 reps Forward Tape Jumps - 1 x daily - 2-3 x weekly - 2 sets - 10 reps Sideways Tape Jumps - 1 x daily - 2-3 x weekly - 2 sets - 10 reps Jump to Single Leg Stance - 2-3 x weekly - 2 sets - 10 reps Lateral Single Leg Lunge Jumps - 2-3 x weekly - 2 sets - 10 reps Single Leg Jumps - 2-3 x weekly - 3 sets - 10 reps

## 2021-05-22 ENCOUNTER — Ambulatory Visit: Payer: PRIVATE HEALTH INSURANCE | Admitting: Physical Therapy

## 2021-05-22 ENCOUNTER — Encounter: Payer: Self-pay | Admitting: Physical Therapy

## 2021-05-22 ENCOUNTER — Other Ambulatory Visit: Payer: Self-pay

## 2021-05-22 DIAGNOSIS — M25562 Pain in left knee: Secondary | ICD-10-CM | POA: Diagnosis not present

## 2021-05-22 DIAGNOSIS — S83512D Sprain of anterior cruciate ligament of left knee, subsequent encounter: Secondary | ICD-10-CM

## 2021-05-22 DIAGNOSIS — M25662 Stiffness of left knee, not elsewhere classified: Secondary | ICD-10-CM

## 2021-05-22 DIAGNOSIS — G8929 Other chronic pain: Secondary | ICD-10-CM

## 2021-05-22 DIAGNOSIS — R2689 Other abnormalities of gait and mobility: Secondary | ICD-10-CM

## 2021-05-22 NOTE — Patient Instructions (Addendum)
Access Code: [email or web address] URL: [email or web address] Date: 05/22/2021 Prepared by: Rosana Hoes  Exercises Prone Hamstring Curl with Anchored Resistance - 1 x daily - 3 x weekly - 3 sets - 10 reps Bridge with Hamstring Curl on Swiss Ball - 1 x daily - 3 x weekly - 3 sets - 6 reps Seated Hamstring Curl with Anchored Resistance - 1 x daily - 3 x weekly - 3 sets - 10 reps Gastroc Stretch on Wall - 1 x daily - 7 x weekly - 2 reps - 30 hold Soleus Stretch on Wall - 1 x daily - 7 x weekly - 2 reps - 30 hold Single Leg Bridge - 1 x daily - 3 x weekly - 2 sets - 10 reps Single Leg Heel Raise with Counter Support - 1 x daily - 3 x weekly - 2 sets - 10 reps Standing Terminal Knee Extension with Resistance - 1 x daily - 3 x weekly - 2 sets - 10 reps Single Leg Stance - 1 x daily - 7 x weekly - 3 sets - 30 sec hold Single Leg Cone Touch - 1 x daily - 7 x weekly - 3 sets - 30 hold Side Stepping with Resistance at Thighs - 1 x daily - 3 x weekly - 3 sets - 20 reps Goblet Squat with Kettlebell - 1 x daily - 3 x weekly - 3 sets - 10 reps Forward Step Down with Heel Tap and Counter Support - 1 x daily - 3 x weekly - 3 sets - 5 reps Single Leg Lunge with Foot on Bench - 1 x daily - 3 x weekly - 3 sets - 10 reps Lunge Matrix - 1 x daily - 3 x weekly - 3 sets - 5 reps Seated Knee Extension with Resistance - 1 x daily - 3 x weekly - 3 sets - 10 reps Squat Jumps - 1 x daily - 2 x weekly - 2 sets - 10 reps Forward Tape Jumps - 1 x daily - 2 x weekly - 2 sets - 10 reps Sideways Tape Jumps - 1 x daily - 2 x weekly - 2 sets - 10 reps Jump to Single Leg Stance - 2 x weekly - 2 sets - 10 reps Lateral Single Leg Lunge Jumps - 2 x weekly - 2 sets - 10 reps Single Leg Jumps - 2 x weekly - 3 sets - 10 reps

## 2021-05-22 NOTE — Therapy (Signed)
[facility] Outpatient Rehabilitation [facility] [address] [place], [place], [address] Phone: [phone]   Fax:  [phone]  Physical Therapy Treatment  Patient Details  Name: Cheryl Cole MRN: [id number] Date of Birth: 2001-07-15 Referring Provider (PT): DR Ramond Marrow   Encounter Date: 05/22/2021   PT End of Session - 05/22/21 0833     Visit Number 23    Number of Visits 29    Date for PT Re-Evaluation 06/23/21    Authorization Type UHC MCD    PT Start Time 0830    PT Stop Time 0915    PT Time Calculation (min) 45 min    Activity Tolerance Patient tolerated treatment well    Behavior During Therapy WFL for tasks assessed/performed             Past Medical History:  Diagnosis Date   Torn meniscus     Past Surgical History:  Procedure Laterality Date   KNEE ARTHROSCOPY WITH ANTERIOR CRUCIATE LIGAMENT (ACL) REPAIR Left 02/05/2021   Procedure: KNEE ARTHROSCOPY WITH ANTERIOR CRUCIATE LIGAMENT (ACL) REPAIR WITH AUTOGRAFT;  Surgeon: Bjorn Pippin, MD;  Location: [facility];  Service: Orthopedics;  Laterality: Left;   KNEE ARTHROSCOPY WITH LATERAL MENISECTOMY  02/05/2021   Procedure: KNEE ARTHROSCOPY WITH LATERAL MENISECTOMY;  Surgeon: Bjorn Pippin, MD;  Location: [facility];  Service: Orthopedics;;   KNEE ARTHROSCOPY WITH MEDIAL MENISECTOMY Left 02/05/2021   Procedure: KNEE ARTHROSCOPY WITH  MEDIAL MENISCUS REPAIR;  Surgeon: Bjorn Pippin, MD;  Location: [facility];  Service: Orthopedics;  Laterality: Left;   NO PAST SURGERIES      There were no vitals filed for this visit.   Subjective Assessment - 05/22/21 0832     Subjective Patient states that she has been consistent with her exercises and her jumping. She notes that the first time she did all her exercises and jumping on the same day and felt a little pain, so has broken them up and has had no pain since then. She reports everything is going good.     Patient is accompained by: Family member   mother   Patient Stated Goals Get back to previous level of activity    Currently in Pain? No/denies                [facility] PT Assessment - 05/22/21 0001       Functional Tests   Functional tests Jumping      Jumping   Comments SL forward jump: patient exhibits stiff land with decreased knee flexion                           [facility] Adult PT Treatment/Exercise - 05/22/21 0001       Exercises   Exercises Knee/Hip      Knee/Hip Exercises: Stretches   Passive Hamstring Stretch 2 reps;30 seconds    Passive Hamstring Stretch Limitations supine with strap    Gastroc Stretch 2 reps;30 seconds    Gastroc Stretch Limitations slant board      Knee/Hip Exercises: Aerobic   Elliptical L5 R5 x 5 min while taking subjective      Knee/Hip Exercises: Plyometrics   Unilateral Jumping Limitations Rear foot elevated SL hop 2 x 5, SL 2" box jump 2 x 5, fwd hop from right onto left x 5, lateral alternating hop 2 x 5, SL fwd hop 2 x 5,  Knee/Hip Exercises: Standing   Step Down 5 sets    Step Down Limitations heel tap on 6" box    Functional Squat Limitations Rear foot elevated split squat 3 x 8 each with 10# bilat    Lunge Walking - Round Trips Lunge matrix 2 x 5 each   forward, lateral, curtsy   Other Standing Knee Exercises SL RDL 3 x 6 with cable FM 10#                    PT Education - 05/22/21 0833     Education Details HEP update    Person(s) Educated Patient    Methods Explanation;Demonstration;Verbal cues    Comprehension Verbalized understanding;Returned demonstration;Verbal cues required;Need further instruction              PT Short Term Goals - 03/26/21 1053       PT SHORT TERM GOAL #1   Title Patient will increase knee flexion to 90 degrees    Baseline 120 - 03/26/21    Time 4    Period Weeks    Status Achieved    Target Date 03/07/21      PT SHORT TERM GOAL #2   Title Patient will  demonstrate full extension    Baseline 0 deg knee extension AROM - 03/26/21    Time 4    Period Weeks    Status Achieved    Target Date 03/07/21      PT SHORT TERM GOAL #3   Title Patient will ambualte 300' without a device    Baseline no longer using AD with ambulation - 03/26/21    Time 4    Period Weeks    Status Achieved    Target Date 03/07/21               PT Long Term Goals - 05/15/21 0835       PT LONG TERM GOAL #1   Title Patient will be indeepdent with exercise program to promote quad strength and single leg stability    Baseline HEP continues to be updated and patient requires cueing for exercise technique    Time 8    Period Weeks    Status On-going    Target Date 06/23/21      PT LONG TERM GOAL #2   Title Patient will demonstrate a 30 second left single leg stance in order to progress to dynamic single leg strength and stability    Baseline Patient able to maintain SLS > 30 sec bilaterally - 03/26/21    Time 6    Period Weeks    Status Achieved    Target Date 06/23/21      PT LONG TERM GOAL #3   Title Patient will demonstrate >/= 4+/5 MMT of left knee in order to initiate running progression    Baseline grossly 4/5 MMT left knee    Time 8    Period Weeks    Status On-going    Target Date 06/23/21      PT LONG TERM GOAL #4   Title Patient will demonstrate >/= 4+/5 MMT hip strength to improve dynamic knee control with single leg tasks to progress to running    Baseline grossly 4/5 MMT - 04/28/21    Time 8    Period Weeks    Status On-going    Target Date 06/23/21      PT LONG TERM GOAL #5   Title Patient will be able to perform single  leg squat x10 without deviation to demonstrate appropriate motor control    Baseline progressing with SL squat, continues to exhibit limitation with depth and control    Time 8    Period Weeks    Status On-going    Target Date 06/23/21      PT LONG TERM GOAL #6   Title Patient will report no increased pain or  swelling with PT related activity or exercise in order to progress strength and to dynamic plyometric tasks    Baseline Patient denies any soreness or swelling following PT or activity at this time - 04/28/21    Time 6    Period Weeks    Status Achieved    Target Date 06/23/21      PT LONG TERM GOAL #7   Title Patient will be able to run at self selected speed and without limitation or increase in pain in order to return to active lifestyle and exercise    Baseline Patient has not initiated running at this time but has begun to progress plyometric exercises    Time 8    Period Weeks    Status New    Target Date 06/23/21      PT LONG TERM GOAL #8   Title Patient will report >/= 79/80 on LEFS to indicate return to prior level of functional activity    Baseline LEFS 60/80    Time 8    Period Weeks    Status New    Target Date 06/23/21                   Plan - 05/22/21 0834     Clinical Impression Statement Patient tolerated therapy well with no adverse effects. Therapy continued to progress SL plyometrics and strength with good tolerance. She is progressing well with SL jumping, able to perform 2" box jump, but does continue to exhibit stiff landing with decreased knee flexion. She is able to improve landing with cueing. Progressed SL strengthening and updated HEP to include lunge series and progression of hamstring strengthening. Patient would benefit from continued skilled PT to progress motion, strength, and back to function in order to return to activities such as valleyball.    PT Treatment/Interventions ADLs/Self Care Home Management;Electrical Stimulation;Ultrasound;Gait training;Stair training;Functional mobility training;Therapeutic activities;Therapeutic exercise;Patient/family education;Manual techniques;Passive range of motion;Scar mobilization;Taping    PT Next Visit Plan Progress HEP, work on ATKHE, progress strengthening as tolerated, dynamic balance activity,  progress plyometric to SL    PT Home Exercise Plan [email or web address]    Consulted and Agree with Plan of Care Patient;Family member/caregiver    Family Member Consulted mother             Patient will benefit from skilled therapeutic intervention in order to improve the following deficits and impairments:  Abnormal gait, Difficulty walking, Decreased safety awareness, Decreased activity tolerance, Decreased strength, Decreased mobility, Increased edema, Pain  Visit Diagnosis: Chronic pain of left knee  Stiffness of left knee, not elsewhere classified  Other abnormalities of gait and mobility  Rupture of anterior cruciate ligament of left knee, subsequent encounter     Problem List There are no problems to display for this patient.   Rosana Hoes, PT, DPT, LAT, ATC 05/22/21  9:26 AM Phone: [phone] Fax: [phone]   [facility] Outpatient Rehabilitation [facility] [address] [place], [place], [address] Phone: [phone]   Fax:  [phone]  Name: Cheryl Cole MRN: [id number] Date of Birth: [birth year]

## 2021-05-29 ENCOUNTER — Encounter: Payer: Self-pay | Admitting: Physical Therapy

## 2021-05-29 ENCOUNTER — Other Ambulatory Visit: Payer: Self-pay

## 2021-05-29 ENCOUNTER — Ambulatory Visit: Payer: PRIVATE HEALTH INSURANCE | Admitting: Physical Therapy

## 2021-05-29 DIAGNOSIS — G8929 Other chronic pain: Secondary | ICD-10-CM

## 2021-05-29 DIAGNOSIS — M25562 Pain in left knee: Secondary | ICD-10-CM

## 2021-05-29 DIAGNOSIS — S83512D Sprain of anterior cruciate ligament of left knee, subsequent encounter: Secondary | ICD-10-CM

## 2021-05-29 DIAGNOSIS — M25662 Stiffness of left knee, not elsewhere classified: Secondary | ICD-10-CM

## 2021-05-29 DIAGNOSIS — R2689 Other abnormalities of gait and mobility: Secondary | ICD-10-CM

## 2021-05-29 NOTE — Therapy (Signed)
[facility] Outpatient Rehabilitation [facility] [address] [place], [place], [address] Phone: [phone]   Fax:  [phone]  Physical Therapy Treatment  Patient Details  Name: Cheryl Cole. Grenz MRN: [id number] Date of Birth: 10-30-2001 Referring Provider (PT): DR Ramond Marrow   Encounter Date: 05/29/2021   PT End of Session - 05/29/21 0832     Visit Number 24    Number of Visits 29    Date for PT Re-Evaluation 06/23/21    Authorization Type UHC MCD    Authorization Time Period resubmitted 7/28 for month of August    PT Start Time 0829    PT Stop Time 0915    PT Time Calculation (min) 46 min    Activity Tolerance Patient tolerated treatment well    Behavior During Therapy [facility] for tasks assessed/performed             Past Medical History:  Diagnosis Date   Torn meniscus     Past Surgical History:  Procedure Laterality Date   KNEE ARTHROSCOPY WITH ANTERIOR CRUCIATE LIGAMENT (ACL) REPAIR Left 02/05/2021   Procedure: KNEE ARTHROSCOPY WITH ANTERIOR CRUCIATE LIGAMENT (ACL) REPAIR WITH AUTOGRAFT;  Surgeon: Bjorn Pippin, MD;  Location: [facility];  Service: Orthopedics;  Laterality: Left;   KNEE ARTHROSCOPY WITH LATERAL MENISECTOMY  02/05/2021   Procedure: KNEE ARTHROSCOPY WITH LATERAL MENISECTOMY;  Surgeon: Bjorn Pippin, MD;  Location: [facility];  Service: Orthopedics;;   KNEE ARTHROSCOPY WITH MEDIAL MENISECTOMY Left 02/05/2021   Procedure: KNEE ARTHROSCOPY WITH  MEDIAL MENISCUS REPAIR;  Surgeon: Bjorn Pippin, MD;  Location: [facility];  Service: Orthopedics;  Laterality: Left;   NO PAST SURGERIES      There were no vitals filed for this visit.   Subjective Assessment - 05/29/21 0831     Subjective Patient reports she is doing well and exercises are going well. She feels the hopping/jumping at home are getting better and she is focusing on bending her knee when she lands. Denies any knee pain with exercises at  home.    Patient is accompained by: Family member   mother   Patient Stated Goals Get back to previous level of activity    Currently in Pain? No/denies                [facility] PT Assessment - 05/29/21 0001       Assessment   Medical Diagnosis L ACL with BTB graft and Medial meniscal reapair    Referring Provider (PT) DR Ramond Marrow    Onset Date/Surgical Date 02/05/21      Precautions   Precautions None      Restrictions   Weight Bearing Restrictions No      Balance Screen   Has the patient fallen in the past 6 months No      Prior Function   Level of Independence Independent    Leisure Volleyball      Cognition   Overall Cognitive Status Within Functional Limits for tasks assessed      Observation/Other Assessments   Focus on Therapeutic Outcomes (FOTO)  NA - MCD    Lower Extremity Functional Scale  64/80      AROM   Right Knee Extension 10   hyper   Left Knee Extension 5   hyper   Left Knee Flexion 135      Strength   Left Hip Extension 4/5    Left Hip ABduction 4/5    Left  Knee Flexion 4/5    Left Knee Extension 4/5                           [facility] Adult PT Treatment/Exercise - 05/29/21 0001       Blood Flow Restriction   Blood Flow Restriction Yes      Blood Flow Restriction-Positions    Blood Flow Restriction Position Sitting      BFR Sitting   Sitting Limb Occulsion Pressure (mmHg) 190    Sitting Exercise Pressure (mmHg) 152    Sitting Exercise Prescription 30,15,15,15, reps w/ 30-60 sec rest    Sitting Exercise Prescription Comment LAQ with 3#      Exercises   Exercises Knee/Hip      Knee/Hip Exercises: Stretches   Active Hamstring Stretch Limitations Dynamic walking hamstring scoop x 10 each    Quad Stretch Limitations Dynamic walking quad stretch with opposite reach x 10 each    Gastroc Stretch Limitations Runner stretch 3 x 15 sec each      Knee/Hip Exercises: Plyometrics   Unilateral Jumping Limitations 2" box jump x  5, fwd hop 2 x 5, skater lateral hop x 5, fwd alternating hope 2 x 10, rear foot elevated POGO hop 2 x 10      Knee/Hip Exercises: Standing   Forward Lunges 3 sets;10 reps    Forward Lunges Limitations walking lunges with b    Functional Squat 3 sets;5 sets    Functional Squat Limitations SL squat with 5#   cued for anterior knee translation, used heel prop to increae quad   Other Standing Knee Exercises Lateral band walk with black around mid shin 3 x 20      Knee/Hip Exercises: Supine   Other Supine Knee/Hip Exercises SL bridge with eccentric heel slide 3 x 6                    PT Education - 05/29/21 0832     Education Details HEP    Person(s) Educated Patient    Methods Explanation;Demonstration;Verbal cues    Comprehension Returned demonstration;Verbalized understanding;Verbal cues required;Need further instruction              PT Short Term Goals - 03/26/21 1053       PT SHORT TERM GOAL #1   Title Patient will increase knee flexion to 90 degrees    Baseline 120 - 03/26/21    Time 4    Period Weeks    Status Achieved    Target Date 03/07/21      PT SHORT TERM GOAL #2   Title Patient will demonstrate full extension    Baseline 0 deg knee extension AROM - 03/26/21    Time 4    Period Weeks    Status Achieved    Target Date 03/07/21      PT SHORT TERM GOAL #3   Title Patient will ambualte 300' without a device    Baseline no longer using AD with ambulation - 03/26/21    Time 4    Period Weeks    Status Achieved    Target Date 03/07/21               PT Long Term Goals - 05/29/21 0939       PT LONG TERM GOAL #1   Title Patient will be indeepdent with exercise program to promote quad strength and single leg stability    Baseline HEP  continues to be updated for strengthening and plyometric progression    Time 8    Period Weeks    Status On-going    Target Date 06/23/21      PT LONG TERM GOAL #2   Title Patient will demonstrate a 30 second  left single leg stance in order to progress to dynamic single leg strength and stability    Baseline Patient able to maintain SLS > 30 sec bilaterally - 03/26/21    Time 6    Period Weeks    Status Achieved      PT LONG TERM GOAL #3   Title Patient will demonstrate >/= 4+/5 MMT of left knee in order to initiate running progression    Baseline grossly 4/5 MMT left knee - 05/29/2021    Time 8    Period Weeks    Status On-going    Target Date 06/23/21      PT LONG TERM GOAL #4   Title Patient will demonstrate >/= 4+/5 MMT hip strength to improve dynamic knee control with single leg tasks to progress to running    Baseline grossly 4/5 MMT - 05/29/2021    Time 8    Period Weeks    Status On-going    Target Date 06/23/21      PT LONG TERM GOAL #5   Title Patient will be able to perform single leg squat x10 without deviation to demonstrate appropriate motor control    Baseline progressing with SL squat, continues to exhibit limitation with depth and control    Time 8    Period Weeks    Status On-going      PT LONG TERM GOAL #6   Title Patient will report no increased pain or swelling with PT related activity or exercise in order to progress strength and to dynamic plyometric tasks    Baseline Patient denies any soreness or swelling following PT or activity at this time - 04/28/21    Time 6    Period Weeks    Status Achieved      PT LONG TERM GOAL #7   Title Patient will be able to run at self selected speed and without limitation or increase in pain in order to return to active lifestyle and exercise    Baseline Patient has not initiated running at this time but is progressing SL plyometric exercises    Time 8    Period Weeks    Status On-going    Target Date 06/23/21      PT LONG TERM GOAL #8   Title Patient will report >/= 79/80 on LEFS to indicate return to prior level of functional activity    Baseline LEFS 64/80    Time 8    Period Weeks    Status On-going    Target Date  06/23/21                   Plan - 05/29/21 6295     Clinical Impression Statement Patient tolerated therapy well with no adverse effects. She is progressing well with her strengthening exercises and with single leg plyometrics. She does continue to demonstrate decreased eccentric control on the left with landing so held off on initiating jogging progression this visit. Therapy focused on progressing strength of quad and hamstring primarily and SL plyometrics. She does not report any pain with therapy but does note that the left side is weaker and with greater fatigue at end of therapy. Patient would benefit from  continued skilled PT to progress her strength and plyometric control in order to return to activities such as running and volleyball without limitation.    PT Frequency 1x / week    PT Duration 8 weeks    PT Treatment/Interventions ADLs/Self Care Home Management;Electrical Stimulation;Ultrasound;Gait training;Stair training;Functional mobility training;Therapeutic activities;Therapeutic exercise;Patient/family education;Manual techniques;Passive range of motion;Scar mobilization;Taping    PT Next Visit Plan Progress HEP, progress strengthening and plyometrics as tolerated, dynamic balance activity    PT Home Exercise Plan [email or web address]    Consulted and Agree with Plan of Care Patient    Family Member Consulted mother             Patient will benefit from skilled therapeutic intervention in order to improve the following deficits and impairments:  Abnormal gait, Difficulty walking, Decreased safety awareness, Decreased activity tolerance, Decreased strength, Decreased mobility, Increased edema, Pain  Visit Diagnosis: Chronic pain of left knee  Stiffness of left knee, not elsewhere classified  Other abnormalities of gait and mobility  Rupture of anterior cruciate ligament of left knee, subsequent encounter     Problem List There are no problems to display for this  patient.   Rosana Hoesampbell Viliami Bracco, PT, DPT, LAT, ATC 05/29/21  10:04 AM Phone: [phone] Fax: [phone]   [facility] [address] [place], [place], [address] Phone: [phone]   Fax:  [phone]  Name: Cheryl Cole MRN: [id number] Date of Birth: 02-Jun-2001    Check all possible CPT codes: [id number]- Therapeutic Exercise, [phone]- Neuro Re-education, [phone] - Gait Training, [phone] - Manual Therapy, [address] - Therapeutic Activities, [phone] - Self Care, [phone] - Electrical stimulation (Manual), [id number] - Vaso, and [id number] - Aquatic therapy

## 2021-06-04 ENCOUNTER — Ambulatory Visit: Payer: PRIVATE HEALTH INSURANCE | Attending: Orthopaedic Surgery | Admitting: Physical Therapy

## 2021-06-04 ENCOUNTER — Other Ambulatory Visit: Payer: Self-pay

## 2021-06-04 ENCOUNTER — Encounter: Payer: Self-pay | Admitting: Physical Therapy

## 2021-06-04 DIAGNOSIS — M25662 Stiffness of left knee, not elsewhere classified: Secondary | ICD-10-CM

## 2021-06-04 DIAGNOSIS — S83512D Sprain of anterior cruciate ligament of left knee, subsequent encounter: Secondary | ICD-10-CM | POA: Diagnosis present

## 2021-06-04 DIAGNOSIS — G8929 Other chronic pain: Secondary | ICD-10-CM

## 2021-06-04 DIAGNOSIS — M25562 Pain in left knee: Secondary | ICD-10-CM | POA: Diagnosis not present

## 2021-06-04 DIAGNOSIS — R2689 Other abnormalities of gait and mobility: Secondary | ICD-10-CM | POA: Diagnosis present

## 2021-06-04 NOTE — Therapy (Signed)
[facility] [address] [place], [place], [address] Phone: [phone]   Fax:  [phone]  Physical Therapy Treatment  Patient Details  Name: Cheryl Cole MRN: [id number] Date of Birth: [birth year] Referring Provider (PT): DR Ramond Marrow   Encounter Date: 06/04/2021   PT End of Session - 06/04/21 0921     Visit Number 25    Number of Visits 29    Date for PT Re-Evaluation 06/23/21    Authorization Type [employer] MCD    Authorization Time Period 05/22/2021 - 06/22/2021    Authorization - Visit Number 3    Authorization - Number of Visits 8    PT Start Time 0916    PT Stop Time 1005    PT Time Calculation (min) 49 min    Activity Tolerance Patient tolerated treatment well    Behavior During Therapy [facility] for tasks assessed/performed             Past Medical History:  Diagnosis Date   Torn meniscus     Past Surgical History:  Procedure Laterality Date   KNEE ARTHROSCOPY WITH ANTERIOR CRUCIATE LIGAMENT (ACL) REPAIR Left 02/05/2021   Procedure: KNEE ARTHROSCOPY WITH ANTERIOR CRUCIATE LIGAMENT (ACL) REPAIR WITH AUTOGRAFT;  Surgeon: Bjorn Pippin, MD;  Location: [facility];  Service: Orthopedics;  Laterality: Left;   KNEE ARTHROSCOPY WITH LATERAL MENISECTOMY  02/05/2021   Procedure: KNEE ARTHROSCOPY WITH LATERAL MENISECTOMY;  Surgeon: Bjorn Pippin, MD;  Location: [facility];  Service: Orthopedics;;   KNEE ARTHROSCOPY WITH MEDIAL MENISECTOMY Left 02/05/2021   Procedure: KNEE ARTHROSCOPY WITH  MEDIAL MENISCUS REPAIR;  Surgeon: Bjorn Pippin, MD;  Location: [facility];  Service: Orthopedics;  Laterality: Left;   NO PAST SURGERIES      There were no vitals filed for this visit.   Subjective Assessment - 06/04/21 0920     Subjective Patient reports she is doing well with no new issues. She has been consistent with her exercises.    Patient Stated Goals Get back to previous level of activity     Currently in Pain? No/denies                [facility] PT Assessment - 06/04/21 0001       Functional Tests   Functional tests Running      Running   Comments Patient jogged at 5 mph x 1:30, she exhibits stiff landing with decreased knee flexion in stance, trendelenburg with compensatory trunk lean                           [facility] Adult PT Treatment/Exercise - 06/04/21 0001       Neuro Re-ed    Neuro Re-ed Details  SL balance on Airx with ball toss 3 x 30 seconds      Blood Flow Restriction-Positions    Blood Flow Restriction Position Sitting      BFR Sitting   Sitting Limb Occulsion Pressure (mmHg) 180    Sitting Exercise Pressure (mmHg) 144    Sitting Exercise Prescription 30,15,15,15, reps w/ 30-60 sec rest    Sitting Exercise Prescription Comment LAQ with 3#      Exercises   Exercises Knee/Hip      Knee/Hip Exercises: Aerobic   Elliptical L5 R5 x 3 min while taking subjective    Tread Mill 3.41mph x 1:30, 5.0 x 1:30      Knee/Hip Exercises:  Plyometrics   Unilateral Jumping Limitations 2" box jump 2 x 5, 4" box jump 2 x 5, triple line hop 2 x 5, skater lateral hop 2 x 5      Knee/Hip Exercises: Standing   Functional Squat Limitations Rear foot elevated split squat 3 x 8 each with 10# bilat   heel prop to target quad   Other Standing Knee Exercises Lateral band walk with black around mid shin 3 x 20      Knee/Hip Exercises: Supine   Other Supine Knee/Hip Exercises SL bridge with eccentric heel slide 3 x 6 using 10# over hips                    PT Education - 06/04/21 0921     Education Details HEP    Person(s) Educated Patient    Methods Explanation;Demonstration;Verbal cues    Comprehension Verbalized understanding;Returned demonstration;Verbal cues required;Need further instruction              PT Short Term Goals - 03/26/21 1053       PT SHORT TERM GOAL #1   Title Patient will increase knee flexion to 90 degrees     Baseline 120 - 03/26/21    Time 4    Period Weeks    Status Achieved    Target Date 03/07/21      PT SHORT TERM GOAL #2   Title Patient will demonstrate full extension    Baseline 0 deg knee extension AROM - 03/26/21    Time 4    Period Weeks    Status Achieved    Target Date 03/07/21      PT SHORT TERM GOAL #3   Title Patient will ambualte 300' without a device    Baseline no longer using AD with ambulation - 03/26/21    Time 4    Period Weeks    Status Achieved    Target Date 03/07/21               PT Long Term Goals - 05/29/21 0939       PT LONG TERM GOAL #1   Title Patient will be indeepdent with exercise program to promote quad strength and single leg stability    Baseline HEP continues to be updated for strengthening and plyometric progression    Time 8    Period Weeks    Status On-going    Target Date 06/23/21      PT LONG TERM GOAL #2   Title Patient will demonstrate a 30 second left single leg stance in order to progress to dynamic single leg strength and stability    Baseline Patient able to maintain SLS > 30 sec bilaterally - 03/26/21    Time 6    Period Weeks    Status Achieved      PT LONG TERM GOAL #3   Title Patient will demonstrate >/= 4+/5 MMT of left knee in order to initiate running progression    Baseline grossly 4/5 MMT left knee - 05/29/2021    Time 8    Period Weeks    Status On-going    Target Date 06/23/21      PT LONG TERM GOAL #4   Title Patient will demonstrate >/= 4+/5 MMT hip strength to improve dynamic knee control with single leg tasks to progress to running    Baseline grossly 4/5 MMT - 05/29/2021    Time 8    Period Weeks    Status  On-going    Target Date 06/23/21      PT LONG TERM GOAL #5   Title Patient will be able to perform single leg squat x10 without deviation to demonstrate appropriate motor control    Baseline progressing with SL squat, continues to exhibit limitation with depth and control    Time 8    Period  Weeks    Status On-going      PT LONG TERM GOAL #6   Title Patient will report no increased pain or swelling with PT related activity or exercise in order to progress strength and to dynamic plyometric tasks    Baseline Patient denies any soreness or swelling following PT or activity at this time - 04/28/21    Time 6    Period Weeks    Status Achieved      PT LONG TERM GOAL #7   Title Patient will be able to run at self selected speed and without limitation or increase in pain in order to return to active lifestyle and exercise    Baseline Patient has not initiated running at this time but is progressing SL plyometric exercises    Time 8    Period Weeks    Status On-going    Target Date 06/23/21      PT LONG TERM GOAL #8   Title Patient will report >/= 79/80 on LEFS to indicate return to prior level of functional activity    Baseline LEFS 64/80    Time 8    Period Weeks    Status On-going    Target Date 06/23/21                   Plan - 06/04/21 6720     Clinical Impression Statement Patient tolerated therapy well with no adverse effects. Patient able to progress to light jogging on treamill this visit with good tolerance. She did exhibit stiffer landing on left resulting in trendelenburg and trunk lean with left stance. Patient also able to progress plyometric with 4" box jump and small triple hop. She is progressing well with her strengthening and denies any increase in pain or residual swelling/soreness. No change to HEP. Patient would benefit from continued skilled PT to progress her strength and plyometric control in order to return to activities such as running and volleyball without limitation.    PT Treatment/Interventions ADLs/Self Care Home Management;Electrical Stimulation;Ultrasound;Gait training;Stair training;Functional mobility training;Therapeutic activities;Therapeutic exercise;Patient/family education;Manual techniques;Passive range of motion;Scar  mobilization;Taping    PT Next Visit Plan Progress HEP, progress strengthening and plyometrics as tolerated, dynamic balance activity    PT Home Exercise Plan [id number]    Consulted and Agree with Plan of Care Patient    Family Member Consulted mother             Patient will benefit from skilled therapeutic intervention in order to improve the following deficits and impairments:  Abnormal gait, Difficulty walking, Decreased safety awareness, Decreased activity tolerance, Decreased strength, Decreased mobility, Increased edema, Pain  Visit Diagnosis: Chronic pain of left knee  Stiffness of left knee, not elsewhere classified  Other abnormalities of gait and mobility  Rupture of anterior cruciate ligament of left knee, subsequent encounter     Problem List There are no problems to display for this patient.   Rosana Hoes, PT, DPT, LAT, ATC 06/04/21  10:10 AM Phone: [phone] Fax: [phone]   [facility] [address] [place], [place], [address] Phone: [phone]   Fax:  [phone]  Name: Cheryl Cole MRN: [id number] Date of Birth: Nov 19, 2000

## 2021-06-11 ENCOUNTER — Ambulatory Visit: Payer: PRIVATE HEALTH INSURANCE | Admitting: Physical Therapy

## 2021-06-11 ENCOUNTER — Other Ambulatory Visit: Payer: Self-pay

## 2021-06-11 ENCOUNTER — Encounter: Payer: Self-pay | Admitting: Physical Therapy

## 2021-06-11 DIAGNOSIS — S83512D Sprain of anterior cruciate ligament of left knee, subsequent encounter: Secondary | ICD-10-CM

## 2021-06-11 DIAGNOSIS — M25662 Stiffness of left knee, not elsewhere classified: Secondary | ICD-10-CM

## 2021-06-11 DIAGNOSIS — G8929 Other chronic pain: Secondary | ICD-10-CM

## 2021-06-11 DIAGNOSIS — M25562 Pain in left knee: Secondary | ICD-10-CM

## 2021-06-11 DIAGNOSIS — R2689 Other abnormalities of gait and mobility: Secondary | ICD-10-CM

## 2021-06-11 NOTE — Therapy (Signed)
[facility] [address] [place], [place], [address] Phone: [phone]   Fax:  [phone]  Physical Therapy Treatment  Patient Details  Name: Cheryl Cole MRN: [id number] Date of Birth: [birth year] Referring Provider (PT): DR Ramond Marrowax Varkey   Encounter Date: 06/11/2021   PT End of Session - 06/11/21 1012     Visit Number 26    Number of Visits 29    Date for PT Re-Evaluation 06/23/21    Authorization Type [employer] MCD    Authorization Time Period 05/22/2021 - 06/22/2021    Authorization - Visit Number 4    Authorization - Number of Visits 8    PT Start Time 0915    PT Stop Time 1005    PT Time Calculation (min) 50 min    Activity Tolerance Patient tolerated treatment well    Behavior During Therapy WFL for tasks assessed/performed             Past Medical History:  Diagnosis Date   Torn meniscus     Past Surgical History:  Procedure Laterality Date   KNEE ARTHROSCOPY WITH ANTERIOR CRUCIATE LIGAMENT (ACL) REPAIR Left 02/05/2021   Procedure: KNEE ARTHROSCOPY WITH ANTERIOR CRUCIATE LIGAMENT (ACL) REPAIR WITH AUTOGRAFT;  Surgeon: Bjorn PippinVarkey, Dax T, MD;  Location: [facility];  Service: Orthopedics;  Laterality: Left;   KNEE ARTHROSCOPY WITH LATERAL MENISECTOMY  02/05/2021   Procedure: KNEE ARTHROSCOPY WITH LATERAL MENISECTOMY;  Surgeon: Bjorn PippinVarkey, Dax T, MD;  Location: [facility];  Service: Orthopedics;;   KNEE ARTHROSCOPY WITH MEDIAL MENISECTOMY Left 02/05/2021   Procedure: KNEE ARTHROSCOPY WITH  MEDIAL MENISCUS REPAIR;  Surgeon: Bjorn PippinVarkey, Dax T, MD;  Location: [facility];  Service: Orthopedics;  Laterality: Left;   NO PAST SURGERIES      There were no vitals filed for this visit.   Subjective Assessment - 06/11/21 1011     Subjective Patient reports she is doing well, she wasn't able to do her exercises as much since last visit.    Patient Stated Goals Get back to previous level of activity     Currently in Pain? No/denies                [facility] - 06/11/21 0001       Running   Comments Stiff landing with decreased knee flexion in stance, trendelenburg with compensatory trunk lean                           [facility] Adult PT Treatment/Exercise - 06/11/21 0001       Blood Flow Restriction   Blood Flow Restriction Yes      Blood Flow Restriction-Positions    Blood Flow Restriction Position Standing      BFR Standing   Standing Limb Occulsion Pressure (mmHg) 250    Standing Exercise Pressure (mmHg) 200    Standing Exercise Prescription 30,15,15,15, reps w/ 30-60 sec rest   20,15,15,15; 40 sec rest break   Standing Exercise Prescription Comment Squat with heels elevated      Exercises   Exercises Knee/Hip      Knee/Hip Exercises: Aerobic   Elliptical L5 R5 x 3 min while taking subjective    Tread Mill 3.820mph x 2:00, 5.0 x 2:00, 3.670mph x 1:00, 5.375mph x 2:00, 3.630mph x 1:00      Knee/Hip Exercises: Plyometrics   Unilateral Jumping Limitations Depth landing 8" with FR for knee flexion cue  x 8, box jump 4" 2 x 5      Knee/Hip Exercises: Standing   Forward Lunges 3 sets   6 reps   Forward Lunges Limitations walking lunges with bilat 10#    Forward Step Up Limitations Forward heel tap 6" with 10# at chest 2 x 6    Other Standing Knee Exercises Forward heel tap into reverse lunge on 4" with 10# at chest x 5      Knee/Hip Exercises: Sidelying   Other Sidelying Knee/Hip Exercises 1/2 side plank with hip abduction 3 x 10 each      Knee/Hip Exercises: Prone   Other Prone Exercises Hamstring curl with FM 10# 3 x 8    Other Prone Exercises Nordic hamstring 3 x 5                    PT Education - 06/11/21 1011     Education Details HEP    Person(s) Educated Patient    Methods Explanation;Demonstration;Verbal cues    Comprehension Verbalized understanding;Returned demonstration;Verbal cues required;Need further instruction               PT Short Term Goals - 03/26/21 1053       PT SHORT TERM GOAL #1   Title Patient will increase knee flexion to 90 degrees    Baseline 120 - 03/26/21    Time 4    Period Weeks    Status Achieved    Target Date 03/07/21      PT SHORT TERM GOAL #2   Title Patient will demonstrate full extension    Baseline 0 deg knee extension AROM - 03/26/21    Time 4    Period Weeks    Status Achieved    Target Date 03/07/21      PT SHORT TERM GOAL #3   Title Patient will ambualte 300' without a device    Baseline no longer using AD with ambulation - 03/26/21    Time 4    Period Weeks    Status Achieved    Target Date 03/07/21               PT Long Term Goals - 05/29/21 0939       PT LONG TERM GOAL #1   Title Patient will be indeepdent with exercise program to promote quad strength and single leg stability    Baseline HEP continues to be updated for strengthening and plyometric progression    Time 8    Period Weeks    Status On-going    Target Date 06/23/21      PT LONG TERM GOAL #2   Title Patient will demonstrate a 30 second left single leg stance in order to progress to dynamic single leg strength and stability    Baseline Patient able to maintain SLS > 30 sec bilaterally - 03/26/21    Time 6    Period Weeks    Status Achieved      PT LONG TERM GOAL #3   Title Patient will demonstrate >/= 4+/5 MMT of left knee in order to initiate running progression    Baseline grossly 4/5 MMT left knee - 05/29/2021    Time 8    Period Weeks    Status On-going    Target Date 06/23/21      PT LONG TERM GOAL #4   Title Patient will demonstrate >/= 4+/5 MMT hip strength to improve dynamic knee control with single leg tasks to progress  to running    Baseline grossly 4/5 MMT - 05/29/2021    Time 8    Period Weeks    Status On-going    Target Date 06/23/21      PT LONG TERM GOAL #5   Title Patient will be able to perform single leg squat x10 without deviation to demonstrate  appropriate motor control    Baseline progressing with SL squat, continues to exhibit limitation with depth and control    Time 8    Period Weeks    Status On-going      PT LONG TERM GOAL #6   Title Patient will report no increased pain or swelling with PT related activity or exercise in order to progress strength and to dynamic plyometric tasks    Baseline Patient denies any soreness or swelling following PT or activity at this time - 04/28/21    Time 6    Period Weeks    Status Achieved      PT LONG TERM GOAL #7   Title Patient will be able to run at self selected speed and without limitation or increase in pain in order to return to active lifestyle and exercise    Baseline Patient has not initiated running at this time but is progressing SL plyometric exercises    Time 8    Period Weeks    Status On-going    Target Date 06/23/21      PT LONG TERM GOAL #8   Title Patient will report >/= 79/80 on LEFS to indicate return to prior level of functional activity    Baseline LEFS 64/80    Time 8    Period Weeks    Status On-going    Target Date 06/23/21                   Plan - 06/11/21 1012     Clinical Impression Statement Patient tolerated therapy well with no adverse effects. She is progressing well with her running and plyometric exercises, and tolerated progression in strengthening without any increase in pain. She does continue to demonstrate limitation in eccentric control and force absorption on the left with landing that leads to a stiffer landing especially with her jogging. She does continue to exhibit strength deficit so focus placed mainly on quads and hamstrings for strengthening this visit. No change to HEP. Patient would benefit from continued skilled PT to progress her strength and plyometric control in order to return to activities such as running and volleyball without limitation.    PT Treatment/Interventions ADLs/Self Care Home Management;Electrical  Stimulation;Ultrasound;Gait training;Stair training;Functional mobility training;Therapeutic activities;Therapeutic exercise;Patient/family education;Manual techniques;Passive range of motion;Scar mobilization;Taping    PT Next Visit Plan Progress HEP, progress strengthening and plyometrics as tolerated, dynamic balance activity    PT Home Exercise Plan [id number]    Consulted and Agree with Plan of Care Patient    Family Member Consulted mother             Patient will benefit from skilled therapeutic intervention in order to improve the following deficits and impairments:  Abnormal gait, Difficulty walking, Decreased safety awareness, Decreased activity tolerance, Decreased strength, Decreased mobility, Increased edema, Pain  Visit Diagnosis: Chronic pain of left knee  Stiffness of left knee, not elsewhere classified  Other abnormalities of gait and mobility  Rupture of anterior cruciate ligament of left knee, subsequent encounter     Problem List There are no problems to display for this patient.   Rosana Hoes, PT,  DPT, LAT, ATC 06/11/21  10:30 AM Phone: [phone] Fax: [phone]   [facility] [address] [place], [place], [address] Phone: [phone]   Fax:  [phone]  Name: Cheryl Cole MRN: [id number] Date of Birth: 01-30-2001

## 2021-06-18 ENCOUNTER — Other Ambulatory Visit: Payer: Self-pay

## 2021-06-18 ENCOUNTER — Ambulatory Visit: Payer: PRIVATE HEALTH INSURANCE | Admitting: Physical Therapy

## 2021-06-18 ENCOUNTER — Encounter: Payer: Self-pay | Admitting: Physical Therapy

## 2021-06-18 DIAGNOSIS — G8929 Other chronic pain: Secondary | ICD-10-CM

## 2021-06-18 DIAGNOSIS — R2689 Other abnormalities of gait and mobility: Secondary | ICD-10-CM

## 2021-06-18 DIAGNOSIS — S83512D Sprain of anterior cruciate ligament of left knee, subsequent encounter: Secondary | ICD-10-CM

## 2021-06-18 DIAGNOSIS — M25662 Stiffness of left knee, not elsewhere classified: Secondary | ICD-10-CM

## 2021-06-18 DIAGNOSIS — M25562 Pain in left knee: Secondary | ICD-10-CM | POA: Diagnosis not present

## 2021-06-18 NOTE — Therapy (Signed)
[facility] [address] [place], [place], [address] Phone: [phone]   Fax:  [phone]  Physical Therapy Treatment  Patient Details  Name: Cheryl Cole MRN: [id number] Date of Birth: [birth year] Referring Provider (PT): DR Ramond Marrow   Encounter Date: 06/18/2021   PT End of Session - 06/18/21 0919     Visit Number 27    Number of Visits 29    Date for PT Re-Evaluation 06/23/21    Authorization Type [employer] MCD    Authorization Time Period 05/22/2021 - 06/22/2021    Authorization - Visit Number 5    Authorization - Number of Visits 8    PT Start Time 0915    PT Stop Time 1005    PT Time Calculation (min) 50 min    Activity Tolerance Patient tolerated treatment well    Behavior During Therapy [facility] for tasks assessed/performed             Past Medical History:  Diagnosis Date   Torn meniscus     Past Surgical History:  Procedure Laterality Date   KNEE ARTHROSCOPY WITH ANTERIOR CRUCIATE LIGAMENT (ACL) REPAIR Left 02/05/2021   Procedure: KNEE ARTHROSCOPY WITH ANTERIOR CRUCIATE LIGAMENT (ACL) REPAIR WITH AUTOGRAFT;  Surgeon: Bjorn Pippin, MD;  Location: [facility];  Service: Orthopedics;  Laterality: Left;   KNEE ARTHROSCOPY WITH LATERAL MENISECTOMY  02/05/2021   Procedure: KNEE ARTHROSCOPY WITH LATERAL MENISECTOMY;  Surgeon: Bjorn Pippin, MD;  Location: [facility];  Service: Orthopedics;;   KNEE ARTHROSCOPY WITH MEDIAL MENISECTOMY Left 02/05/2021   Procedure: KNEE ARTHROSCOPY WITH  MEDIAL MENISCUS REPAIR;  Surgeon: Bjorn Pippin, MD;  Location: [facility];  Service: Orthopedics;  Laterality: Left;   NO PAST SURGERIES      There were no vitals filed for this visit.   Subjective Assessment - 06/18/21 0918     Subjective Patient reports she is doing well. No new issues. States her knee is feeling good, she was consistent with exercises and plyometrics. She feels the hopping is  getting better.    Patient Stated Goals Get back to previous level of activity    Currently in Pain? No/denies                [facility] PT Assessment - 06/18/21 0001       Observation/Other Assessments   Lower Extremity Functional Scale  70/80      Single Leg Squat   Comments Patient able to perform 10 reps to 22" surface without deviation      Jumping   Comments Patient able to perform 6" box jump      Running   Comments Stiff landing with decreased knee flexion in stance, trendelenburg with compensatory trunk lean                           [facility] Adult PT Treatment/Exercise - 06/18/21 0001       Blood Flow Restriction   Blood Flow Restriction Yes      Blood Flow Restriction-Positions    Blood Flow Restriction Position Sitting;Prone      BFR Sitting   Sitting Limb Occulsion Pressure (mmHg) 190    Sitting Exercise Pressure (mmHg) 152    Sitting Exercise Prescription 30,15,15,15, reps w/ 30-60 sec rest    Sitting Exercise Prescription Comment LAQ with 2#      BFR Prone   Prone Limb Occulsion Pressure (mmHg)  210    Prone Exercise Pressure (mmHg) 168    Prone Exercise Prescription 30,15,15,15, reps w/ 30-60 sec rest    Prone Exercise Prescription Comment  Hamstring curl with 2#      Exercises   Exercises Knee/Hip      Knee/Hip Exercises: Aerobic   Elliptical L5 R5 x 3 min while taking subjective    Tread Mill 3.570mph x 1:00, 5.5 x 2:00, 3.820mph x 1:00, 5.625mph x 2:00, 3.230mph x 1:00      Knee/Hip Exercises: Machines for Strengthening   Cybex Leg Press SL: left 60# x 8, 80# 3 x 6; right: 100# x8, 120# 3 x 8      Knee/Hip Exercises: Plyometrics   Bilateral Jumping Limitations Drop jump from 6" 2 x 5    Unilateral Jumping Limitations 4" box jump 2 x 5, 6" box jump x 5,      Knee/Hip Exercises: Standing   Forward Step Up Limitations 10" runner step-up 3 x 6    Functional Squat Limitations Squat on BOSU 2 x 8    Other Standing Knee Exercises Retro and  eccentric forward walking with manual banded resistance x 2 lengths each   focus on staying low for qaud control     Knee/Hip Exercises: Sidelying   Other Sidelying Knee/Hip Exercises 1/2 side plank with clamshell using blue 3 x 10 each                    PT Education - 06/18/21 0919     Education Details HEP    Person(s) Educated Patient    Methods Explanation;Demonstration;Verbal cues    Comprehension Verbalized understanding;Returned demonstration;Verbal cues required;Need further instruction              PT Short Term Goals - 03/26/21 1053       PT SHORT TERM GOAL #1   Title Patient will increase knee flexion to 90 degrees    Baseline 120 - 03/26/21    Time 4    Period Weeks    Status Achieved    Target Date 03/07/21      PT SHORT TERM GOAL #2   Title Patient will demonstrate full extension    Baseline 0 deg knee extension AROM - 03/26/21    Time 4    Period Weeks    Status Achieved    Target Date 03/07/21      PT SHORT TERM GOAL #3   Title Patient will ambualte 300' without a device    Baseline no longer using AD with ambulation - 03/26/21    Time 4    Period Weeks    Status Achieved    Target Date 03/07/21               PT Long Term Goals - 06/18/21 1009       PT LONG TERM GOAL #1   Title Patient will be indeepdent with exercise program to promote quad strength and single leg stability    Baseline HEP continues to be updated for strengthening and plyometric progression    Time 8    Period Weeks    Status On-going      PT LONG TERM GOAL #2   Title Patient will demonstrate a 30 second left single leg stance in order to progress to dynamic single leg strength and stability    Baseline Patient able to maintain SLS > 30 sec bilaterally - 03/26/21    Time 6    Period  Weeks    Status Achieved      PT LONG TERM GOAL #3   Title Patient will demonstrate 5/5 MMT of left knee in order to normalize running and landing mechanics    Baseline  grossly 4+/5 MMT left knee - 06/18/2021    Time 8    Period Weeks    Status Revised      PT LONG TERM GOAL #4   Title Patient will demonstrate >/= 4+/5 MMT hip strength to improve dynamic knee control with landing while running or jumping    Baseline grossly 4/5 MMT - 06/18/2021    Time 8    Period Weeks    Status On-going      PT LONG TERM GOAL #5   Title Patient will be able to perform single leg squat x10 without deviation to demonstrate appropriate motor control    Baseline patient able to perform 10 reps SLS to 22" surface without deviation    Time 8    Period Weeks    Status Achieved      PT LONG TERM GOAL #6   Title Patient will report no increased pain or swelling with PT related activity or exercise in order to progress strength and to dynamic plyometric tasks    Baseline Patient denies any soreness or swelling following PT or activity at this time - 04/28/21    Time 6    Period Weeks    Status Achieved      PT LONG TERM GOAL #7   Title Patient will be able to run at self selected speed and without limitation or increase in pain in order to return to active lifestyle and exercise    Baseline Patient tolerating running progression at lower speeds, she does demonstrate deviations with landing    Time 8    Period Weeks    Status On-going      PT LONG TERM GOAL #8   Title Patient will report >/= 79/80 on LEFS to indicate return to prior level of functional activity    Baseline LEFS 70/80    Time 8    Period Weeks    Status On-going                   Plan - 06/18/21 0920     Clinical Impression Statement Patient tolerated therapy well with no adverse effects. She demonstrates improvement in SL jumping ability progressions of SL strengthening. She does continue to demonstrate landing limitations with running and overall strength deficit. She was encouraged to continue strengthening for quad emphasis at home and landing mechanics. Patient would benefit from  continued skilled PT to progress her strength and plyometric control in order to return to activities such as running and volleyball without limitation.    PT Treatment/Interventions ADLs/Self Care Home Management;Electrical Stimulation;Ultrasound;Gait training;Stair training;Functional mobility training;Therapeutic activities;Therapeutic exercise;Patient/family education;Manual techniques;Passive range of motion;Scar mobilization;Taping    PT Next Visit Plan Progress HEP, progress strengthening and plyometrics as tolerated, dynamic balance activity    PT Home Exercise Plan [id number]    Consulted and Agree with Plan of Care Patient             Patient will benefit from skilled therapeutic intervention in order to improve the following deficits and impairments:  Abnormal gait, Difficulty walking, Decreased safety awareness, Decreased activity tolerance, Decreased strength, Decreased mobility, Increased edema, Pain  Visit Diagnosis: Chronic pain of left knee  Stiffness of left knee, not elsewhere classified  Other abnormalities of gait  and mobility  Rupture of anterior cruciate ligament of left knee, subsequent encounter     Problem List There are no problems to display for this patient.   Rosana Hoes, PT, DPT, LAT, ATC 06/18/21  10:24 AM Phone: [phone] Fax: [phone]   [facility] [address] [place], [place], [address] Phone: [phone]   Fax:  [phone]  Name: Cheryl Cole MRN: [id number] Date of Birth: 11-Jun-2001

## 2021-06-23 ENCOUNTER — Other Ambulatory Visit: Payer: Self-pay

## 2021-06-23 ENCOUNTER — Ambulatory Visit: Payer: PRIVATE HEALTH INSURANCE | Admitting: Physical Therapy

## 2021-06-23 ENCOUNTER — Encounter: Payer: Self-pay | Admitting: Physical Therapy

## 2021-06-23 DIAGNOSIS — R2689 Other abnormalities of gait and mobility: Secondary | ICD-10-CM

## 2021-06-23 DIAGNOSIS — M25562 Pain in left knee: Secondary | ICD-10-CM

## 2021-06-23 DIAGNOSIS — S83512D Sprain of anterior cruciate ligament of left knee, subsequent encounter: Secondary | ICD-10-CM

## 2021-06-23 DIAGNOSIS — G8929 Other chronic pain: Secondary | ICD-10-CM

## 2021-06-23 DIAGNOSIS — M25662 Stiffness of left knee, not elsewhere classified: Secondary | ICD-10-CM

## 2021-06-23 NOTE — Therapy (Signed)
[facility] [address] [place], [place], [address] Phone: [phone]   Fax:  [phone]  Physical Therapy Treatment / ERO  Patient Details  Name: Cheryl Cole. Willison MRN: [id number] Date of Birth: 2001/02/28 Referring Provider (PT): DR Ramond Marrow   Encounter Date: 06/23/2021   PT End of Session - 06/23/21 0834     Visit Number 28    Number of Visits 35    Date for PT Re-Evaluation 08/18/21    Authorization Type UHC MCD    Authorization Time Period 06/19/2021 - 08/15/2021    Authorization - Visit Number 1    Authorization - Number of Visits 8    PT Start Time 0831    PT Stop Time 0915    PT Time Calculation (min) 44 min    Activity Tolerance Patient tolerated treatment well    Behavior During Therapy [facility] for tasks assessed/performed             Past Medical History:  Diagnosis Date   Torn meniscus     Past Surgical History:  Procedure Laterality Date   KNEE ARTHROSCOPY WITH ANTERIOR CRUCIATE LIGAMENT (ACL) REPAIR Left 02/05/2021   Procedure: KNEE ARTHROSCOPY WITH ANTERIOR CRUCIATE LIGAMENT (ACL) REPAIR WITH AUTOGRAFT;  Surgeon: Bjorn Pippin, MD;  Location: [facility];  Service: Orthopedics;  Laterality: Left;   KNEE ARTHROSCOPY WITH LATERAL MENISECTOMY  02/05/2021   Procedure: KNEE ARTHROSCOPY WITH LATERAL MENISECTOMY;  Surgeon: Bjorn Pippin, MD;  Location: [facility];  Service: Orthopedics;;   KNEE ARTHROSCOPY WITH MEDIAL MENISECTOMY Left 02/05/2021   Procedure: KNEE ARTHROSCOPY WITH  MEDIAL MENISCUS REPAIR;  Surgeon: Bjorn Pippin, MD;  Location: [facility];  Service: Orthopedics;  Laterality: Left;   NO PAST SURGERIES      There were no vitals filed for this visit.   Subjective Assessment - 06/23/21 0833     Subjective Patient reports she is doing well with no new issues. States exercises are going well. She did do some jogging over the weekend and states it feels  awkward.    Patient Stated Goals Get back to previous level of activity    Currently in Pain? No/denies                [facility] PT Assessment - 06/23/21 0001       Assessment   Medical Diagnosis L ACL with BTB graft and Medial meniscal reapair    Referring Provider (PT) DR Ramond Marrow      Precautions   Precautions None      Restrictions   Weight Bearing Restrictions No      Balance Screen   Has the patient fallen in the past 6 months No      Prior Function   Level of Independence Independent      Cognition   Overall Cognitive Status Within Functional Limits for tasks assessed      Observation/Other Assessments   Lower Extremity Functional Scale  70/80      Running   Comments Stiff landing with decreased knee flexion in stance, trendelenburg with compensatory trunk lean      Strength   Left Hip Extension 4/5    Left Hip ABduction 4/5    Left Knee Flexion 4+/5    Left Knee Extension 4+/5                           [facility] Adult  PT Treatment/Exercise - 06/23/21 0001       Neuro Re-ed    Neuro Re-ed Details  SL balance on BOSU with ball toss 4 x 30"      Knee/Hip Exercises: Machines for Strengthening   Cybex Knee Extension Eccentric on left 20# 3 x 6    Cybex Knee Flexion Eccentric on left 25# 3 x 6      Knee/Hip Exercises: Plyometrics   Unilateral Jumping Limitations Rear foot elevated pogo hop 2 x 30", fwd/bwd and lat line hop 2 x 15" each      Knee/Hip Exercises: Standing   Forward Step Up Limitations 12" runner step-up 3 x 8    Functional Squat Limitations Rear foot elevated split squat 4 x 6 with 15# bilat    Other Standing Knee Exercises Slant board squat TKE with red power band 2 x 10    Other Standing Knee Exercises Deadlift with 45# dumbbell 4 x 8                    PT Education - 06/23/21 0834     Education Details POC update and HEP    Person(s) Educated Patient    Methods Explanation;Demonstration;Verbal cues     Comprehension Verbalized understanding;Returned demonstration;Verbal cues required;Need further instruction              PT Short Term Goals - 03/26/21 1053       PT SHORT TERM GOAL #1   Title Patient will increase knee flexion to 90 degrees    Baseline 120 - 03/26/21    Time 4    Period Weeks    Status Achieved    Target Date 03/07/21      PT SHORT TERM GOAL #2   Title Patient will demonstrate full extension    Baseline 0 deg knee extension AROM - 03/26/21    Time 4    Period Weeks    Status Achieved    Target Date 03/07/21      PT SHORT TERM GOAL #3   Title Patient will ambualte 300' without a device    Baseline no longer using AD with ambulation - 03/26/21    Time 4    Period Weeks    Status Achieved    Target Date 03/07/21               PT Long Term Goals - 06/23/21 0839       PT LONG TERM GOAL #1   Title Patient will be indeepdent with exercise program to promote quad strength and single leg stability    Baseline HEP continues to be updated for strengthening and plyometric progression    Time 8    Period Weeks    Status On-going    Target Date 08/18/21      PT LONG TERM GOAL #2   Title Patient will demonstrate a 30 second left single leg stance in order to progress to dynamic single leg strength and stability    Baseline Patient able to maintain SLS > 30 sec bilaterally - 03/26/21    Time 6    Period Weeks    Status Achieved      PT LONG TERM GOAL #3   Title Patient will demonstrate 5/5 MMT of left knee in order to normalize running and landing mechanics    Baseline grossly 4+/5 MMT left knee - 06/23/21    Time 8    Period Weeks    Status Revised  Target Date 08/18/21      PT LONG TERM GOAL #4   Title Patient will demonstrate >/= 4+/5 MMT hip strength to improve dynamic knee control with landing while running or jumping    Baseline grossly 4/5 MMT - 06/23/21    Time 8    Period Weeks    Status On-going    Target Date 08/18/21      PT LONG  TERM GOAL #5   Title Patient will be able to perform single leg squat x10 without deviation to demonstrate appropriate motor control    Baseline patient able to perform 10 reps SLS to 22" surface without deviation    Time 8    Period Weeks    Status Achieved      PT LONG TERM GOAL #6   Title Patient will report no increased pain or swelling with PT related activity or exercise in order to progress strength and to dynamic plyometric tasks    Baseline Patient denies any soreness or swelling following PT or activity at this time - 04/28/21    Time 6    Period Weeks    Status Achieved      PT LONG TERM GOAL #7   Title Patient will be able to run at self selected speed and without limitation or increase in pain in order to return to active lifestyle and exercise    Baseline Patient tolerating running progression at lower speeds, she does demonstrate deviations with landing - 06/23/21    Time 8    Period Weeks    Status On-going    Target Date 08/18/21      PT LONG TERM GOAL #8   Title Patient will report >/= 79/80 on LEFS to indicate return to prior level of functional activity    Baseline LEFS 70/80 - 06/23/21    Time 8    Period Weeks    Status On-going    Target Date 08/18/21                   Plan - 06/23/21 2130     Clinical Impression Statement Patient tolerated therapy well with no adverse effects. She is progressing well toward her goals but does continue to demonstrate strength deficit on left with deficits in landing mechanics and running. Therapy continued to focus on progressing strength and plyometrics. Incorporated longer durations of SL hopping to progress endurance and cued patient for proper force absorption with landing. Patient would benefit from continued skilled PT to progress her strength and plyometric control in order to return to activities such as running and volleyball without limitation.    PT Frequency 1x / week    PT Duration 8 weeks    PT  Treatment/Interventions ADLs/Self Care Home Management;Electrical Stimulation;Ultrasound;Gait training;Stair training;Functional mobility training;Therapeutic activities;Therapeutic exercise;Patient/family education;Manual techniques;Passive range of motion;Scar mobilization;Taping    PT Next Visit Plan Progress HEP, progress strengthening and plyometrics as tolerated, dynamic balance activity    PT Home Exercise Plan [id number]    Consulted and Agree with Plan of Care Patient             Patient will benefit from skilled therapeutic intervention in order to improve the following deficits and impairments:  Abnormal gait, Difficulty walking, Decreased safety awareness, Decreased activity tolerance, Decreased strength, Decreased mobility, Increased edema, Pain  Visit Diagnosis: Chronic pain of left knee  Stiffness of left knee, not elsewhere classified  Other abnormalities of gait and mobility  Rupture of anterior cruciate ligament of  left knee, subsequent encounter     Problem List There are no problems to display for this patient.   Rosana Hoes, PT, DPT, LAT, ATC 06/23/21  9:37 AM Phone: [phone] Fax: [phone]   [facility] [address] [place], [place], [address] Phone: [phone]   Fax:  [phone]  Name: Zaryia Markel. Matteo MRN: [id number] Date of Birth: 23-Dec-2000

## 2021-06-30 ENCOUNTER — Other Ambulatory Visit: Payer: Self-pay

## 2021-06-30 ENCOUNTER — Ambulatory Visit: Payer: PRIVATE HEALTH INSURANCE | Admitting: Physical Therapy

## 2021-06-30 ENCOUNTER — Encounter: Payer: Self-pay | Admitting: Physical Therapy

## 2021-06-30 DIAGNOSIS — G8929 Other chronic pain: Secondary | ICD-10-CM

## 2021-06-30 DIAGNOSIS — M25662 Stiffness of left knee, not elsewhere classified: Secondary | ICD-10-CM

## 2021-06-30 DIAGNOSIS — R2689 Other abnormalities of gait and mobility: Secondary | ICD-10-CM

## 2021-06-30 DIAGNOSIS — S83512D Sprain of anterior cruciate ligament of left knee, subsequent encounter: Secondary | ICD-10-CM

## 2021-06-30 DIAGNOSIS — M25562 Pain in left knee: Secondary | ICD-10-CM | POA: Diagnosis not present

## 2021-06-30 NOTE — Therapy (Signed)
[facility] Outpatient Rehabilitation [facility] [address] [place], [place], [address] Phone: [phone]   Fax:  [phone]  Physical Therapy Treatment  Patient Details  Name: Cheryl Cole MRN: [id number] Date of Birth: [birth year] Referring Provider (PT): DR Ramond Marrow   Encounter Date: 06/30/2021   PT End of Session - 06/30/21 0834     Visit Number 29    Number of Visits 35    Date for PT Re-Evaluation 08/18/21    Authorization Type UHC MCD    Authorization Time Period 06/19/2021 - 08/15/2021    Authorization - Visit Number 2    Authorization - Number of Visits 8    PT Start Time 0831    PT Stop Time 0915    PT Time Calculation (min) 44 min    Activity Tolerance Patient tolerated treatment well    Behavior During Therapy [facility] for tasks assessed/performed             Past Medical History:  Diagnosis Date   Torn meniscus     Past Surgical History:  Procedure Laterality Date   KNEE ARTHROSCOPY WITH ANTERIOR CRUCIATE LIGAMENT (ACL) REPAIR Left 02/05/2021   Procedure: KNEE ARTHROSCOPY WITH ANTERIOR CRUCIATE LIGAMENT (ACL) REPAIR WITH AUTOGRAFT;  Surgeon: Bjorn Pippin, MD;  Location: [facility];  Service: Orthopedics;  Laterality: Left;   KNEE ARTHROSCOPY WITH LATERAL MENISECTOMY  02/05/2021   Procedure: KNEE ARTHROSCOPY WITH LATERAL MENISECTOMY;  Surgeon: Bjorn Pippin, MD;  Location: [facility];  Service: Orthopedics;;   KNEE ARTHROSCOPY WITH MEDIAL MENISECTOMY Left 02/05/2021   Procedure: KNEE ARTHROSCOPY WITH  MEDIAL MENISCUS REPAIR;  Surgeon: Bjorn Pippin, MD;  Location: [facility];  Service: Orthopedics;  Laterality: Left;   NO PAST SURGERIES      There were no vitals filed for this visit.   Subjective Assessment - 06/30/21 0833     Subjective Patient reports she continues to do well and working on her exercises. She has not done much jogging the past week.    Patient Stated Goals Get back to  previous level of activity    Currently in Pain? No/denies                [facility] PT Assessment - 06/30/21 0001       Jumping   Comments Patient exhibits deficit with distance and exhibits decreased eccentric control with stiff landing                           [facility] Adult PT Treatment/Exercise - 06/30/21 0001       Neuro Re-ed    Neuro Re-ed Details  SL balance on BOSU with 5# kettlebell pass 3 x 30"      Knee/Hip Exercises: Aerobic   Elliptical L5 R5 x 4 min while taking subjective      Knee/Hip Exercises: Machines for Strengthening   Cybex Knee Extension Eccentric on left 20# 3 x 6      Knee/Hip Exercises: Plyometrics   Bilateral Jumping Limitations POGO hop stationary x 30, fwd/bwd x 30, side-to-side x 30    Unilateral Jumping Limitations POGO hop stationary x 30, fwd/bwd x 30, side-to-side x 30; lateral skater hop x 10, fwd broad jump x 5      Knee/Hip Exercises: Standing   Forward Lunges Limitations 4 x 6, band pulling patient anterior to work on eccentric quad control    Other Standing Knee Exercises  Lateral shuffle against banded resistace 3 x 2 lengths    Other Standing Knee Exercises Deadlift with 45# dumbbell 4 x 8                    PT Education - 06/30/21 0834     Education Details HEP, walk run progression    Person(s) Educated Patient    Methods Explanation;Demonstration;Verbal cues;Handout    Comprehension Verbalized understanding;Returned demonstration;Verbal cues required;Need further instruction              PT Short Term Goals - 03/26/21 1053       PT SHORT TERM GOAL #1   Title Patient will increase knee flexion to 90 degrees    Baseline 120 - 03/26/21    Time 4    Period Weeks    Status Achieved    Target Date 03/07/21      PT SHORT TERM GOAL #2   Title Patient will demonstrate full extension    Baseline 0 deg knee extension AROM - 03/26/21    Time 4    Period Weeks    Status Achieved    Target Date  03/07/21      PT SHORT TERM GOAL #3   Title Patient will ambualte 300' without a device    Baseline no longer using AD with ambulation - 03/26/21    Time 4    Period Weeks    Status Achieved    Target Date 03/07/21               PT Long Term Goals - 06/23/21 0839       PT LONG TERM GOAL #1   Title Patient will be indeepdent with exercise program to promote quad strength and single leg stability    Baseline HEP continues to be updated for strengthening and plyometric progression    Time 8    Period Weeks    Status On-going    Target Date 08/18/21      PT LONG TERM GOAL #2   Title Patient will demonstrate a 30 second left single leg stance in order to progress to dynamic single leg strength and stability    Baseline Patient able to maintain SLS > 30 sec bilaterally - 03/26/21    Time 6    Period Weeks    Status Achieved      PT LONG TERM GOAL #3   Title Patient will demonstrate 5/5 MMT of left knee in order to normalize running and landing mechanics    Baseline grossly 4+/5 MMT left knee - 06/23/21    Time 8    Period Weeks    Status Revised    Target Date 08/18/21      PT LONG TERM GOAL #4   Title Patient will demonstrate >/= 4+/5 MMT hip strength to improve dynamic knee control with landing while running or jumping    Baseline grossly 4/5 MMT - 06/23/21    Time 8    Period Weeks    Status On-going    Target Date 08/18/21      PT LONG TERM GOAL #5   Title Patient will be able to perform single leg squat x10 without deviation to demonstrate appropriate motor control    Baseline patient able to perform 10 reps SLS to 22" surface without deviation    Time 8    Period Weeks    Status Achieved      PT LONG TERM GOAL #6   Title  Patient will report no increased pain or swelling with PT related activity or exercise in order to progress strength and to dynamic plyometric tasks    Baseline Patient denies any soreness or swelling following PT or activity at this time -  04/28/21    Time 6    Period Weeks    Status Achieved      PT LONG TERM GOAL #7   Title Patient will be able to run at self selected speed and without limitation or increase in pain in order to return to active lifestyle and exercise    Baseline Patient tolerating running progression at lower speeds, she does demonstrate deviations with landing - 06/23/21    Time 8    Period Weeks    Status On-going    Target Date 08/18/21      PT LONG TERM GOAL #8   Title Patient will report >/= 79/80 on LEFS to indicate return to prior level of functional activity    Baseline LEFS 70/80 - 06/23/21    Time 8    Period Weeks    Status On-going    Target Date 08/18/21                   Plan - 06/30/21 0845     Clinical Impression Statement Patient tolerated therapy well with no adverse effects. Therapy focused on continued plyometric and strength progression. Patient continues to demonstrate decreased eccentric control on the left with landing so incorporated banded lunges to focus on quad control. Patient was provided handout for walk-run progression. Patient would benefit from continued skilled PT to progress her strength and plyometric control in order to return to activities such as running and volleyball without limitation.    PT Treatment/Interventions ADLs/Self Care Home Management;Electrical Stimulation;Ultrasound;Gait training;Stair training;Functional mobility training;Therapeutic activities;Therapeutic exercise;Patient/family education;Manual techniques;Passive range of motion;Scar mobilization;Taping    PT Next Visit Plan Progress HEP, progress strengthening and plyometrics as tolerated, dynamic balance activity    PT Home Exercise Plan [id number]    Consulted and Agree with Plan of Care Patient             Patient will benefit from skilled therapeutic intervention in order to improve the following deficits and impairments:  Abnormal gait, Difficulty walking, Decreased safety  awareness, Decreased activity tolerance, Decreased strength, Decreased mobility, Increased edema, Pain  Visit Diagnosis: Chronic pain of left knee  Stiffness of left knee, not elsewhere classified  Other abnormalities of gait and mobility  Rupture of anterior cruciate ligament of left knee, subsequent encounter     Problem List There are no problems to display for this patient.   Rosana Hoes, PT, DPT, LAT, ATC 06/30/21  9:15 AM Phone: [phone] Fax: [phone]   [facility] Outpatient Rehabilitation [facility] [address] [place], [place], [address] Phone: [phone]   Fax:  [phone]  Name: Cheryl Cole MRN: [id number] Date of Birth: 2001/05/27

## 2021-07-10 ENCOUNTER — Ambulatory Visit: Payer: 59 | Attending: Orthopaedic Surgery | Admitting: Physical Therapy

## 2021-07-10 ENCOUNTER — Other Ambulatory Visit: Payer: Self-pay

## 2021-07-10 ENCOUNTER — Encounter: Payer: Self-pay | Admitting: Physical Therapy

## 2021-07-10 DIAGNOSIS — S83512D Sprain of anterior cruciate ligament of left knee, subsequent encounter: Secondary | ICD-10-CM | POA: Diagnosis present

## 2021-07-10 DIAGNOSIS — M25662 Stiffness of left knee, not elsewhere classified: Secondary | ICD-10-CM | POA: Insufficient documentation

## 2021-07-10 DIAGNOSIS — M25562 Pain in left knee: Secondary | ICD-10-CM | POA: Diagnosis present

## 2021-07-10 DIAGNOSIS — G8929 Other chronic pain: Secondary | ICD-10-CM | POA: Diagnosis present

## 2021-07-10 DIAGNOSIS — R2689 Other abnormalities of gait and mobility: Secondary | ICD-10-CM | POA: Diagnosis present

## 2021-07-10 NOTE — Therapy (Signed)
[facility] Outpatient Rehabilitation [facility] [address] [place], [place], [address] Phone: [phone]   Fax:  [phone]  Physical Therapy Treatment  Patient Details  Name: Cheryl Cole MRN: [id number] Date of Birth: Sep 10, 2001 Referring Provider (PT): DR Ramond Marrow   Encounter Date: 07/10/2021   PT End of Session - 07/10/21 0804     Visit Number 30    Number of Visits 35    Date for PT Re-Evaluation 08/18/21    Authorization Type UHC MCD    Authorization Time Period 06/19/2021 - 08/15/2021    Authorization - Visit Number 3    Authorization - Number of Visits 8    PT Start Time 0800    PT Stop Time 0840    PT Time Calculation (min) 40 min    Activity Tolerance Patient tolerated treatment well    Behavior During Therapy [facility] for tasks assessed/performed             Past Medical History:  Diagnosis Date   Torn meniscus     Past Surgical History:  Procedure Laterality Date   KNEE ARTHROSCOPY WITH ANTERIOR CRUCIATE LIGAMENT (ACL) REPAIR Left 02/05/2021   Procedure: KNEE ARTHROSCOPY WITH ANTERIOR CRUCIATE LIGAMENT (ACL) REPAIR WITH AUTOGRAFT;  Surgeon: Bjorn Pippin, MD;  Location: [facility];  Service: Orthopedics;  Laterality: Left;   KNEE ARTHROSCOPY WITH LATERAL MENISECTOMY  02/05/2021   Procedure: KNEE ARTHROSCOPY WITH LATERAL MENISECTOMY;  Surgeon: Bjorn Pippin, MD;  Location: [facility];  Service: Orthopedics;;   KNEE ARTHROSCOPY WITH MEDIAL MENISECTOMY Left 02/05/2021   Procedure: KNEE ARTHROSCOPY WITH  MEDIAL MENISCUS REPAIR;  Surgeon: Bjorn Pippin, MD;  Location: [facility];  Service: Orthopedics;  Laterality: Left;   NO PAST SURGERIES      There were no vitals filed for this visit.   Subjective Assessment - 07/10/21 0802     Subjective Started the running program last week. Took some time off in between  because I had some tightness in left quad with the first two running intervals and then it  was better.    Currently in Pain? No/denies                               [facility] Adult PT Treatment/Exercise - 07/10/21 0001       Knee/Hip Exercises: Stretches   Lobbyist Limitations standing 1 x 30 sec    Other Knee/Hip Stretches slant board stretch x 60 sec      Knee/Hip Exercises: Aerobic   Elliptical L5 R5 x 5 min      Knee/Hip Exercises: Machines for Strengthening   Cybex Knee Extension Eccentric on left 20# 3 x 8      Knee/Hip Exercises: Plyometrics   Unilateral Jumping Limitations POGO hop stationary x 30, fwd/bwd x 30, side-to-side x 30; lateral skater hop x 10, fwd broad jump x 5 x 2      Knee/Hip Exercises: Standing   Heel Raises Limitations single leg x 20    Forward Lunges 2 sets   12 reps   Forward Lunges Limitations walking lunges with bilat 10#    Forward Step Up Limitations 12" runner step-up x 20    Step Down Limitations heel tap -eccentric control from 4 inch step x 20    Functional Squat Limitations Rear foot elevated split squat 8 x 2 with 10# bilat    Other Standing  Knee Exercises Lateral shuffle against banded resistace 20 steps x 4   blue band   Other Standing Knee Exercises Deadlift with 45# dumbbell 3 x 10                       PT Short Term Goals - 03/26/21 1053       PT SHORT TERM GOAL #1   Title Patient will increase knee flexion to 90 degrees    Baseline 120 - 03/26/21    Time 4    Period Weeks    Status Achieved    Target Date 03/07/21      PT SHORT TERM GOAL #2   Title Patient will demonstrate full extension    Baseline 0 deg knee extension AROM - 03/26/21    Time 4    Period Weeks    Status Achieved    Target Date 03/07/21      PT SHORT TERM GOAL #3   Title Patient will ambualte 300' without a device    Baseline no longer using AD with ambulation - 03/26/21    Time 4    Period Weeks    Status Achieved    Target Date 03/07/21               PT Long Term Goals - 07/10/21 0807       PT  LONG TERM GOAL #1   Title Patient will be indeepdent with exercise program to promote quad strength and single leg stability    Baseline HEP continues to be updated for strengthening and plyometric progression    Time 8    Period Weeks    Status On-going      PT LONG TERM GOAL #2   Title Patient will demonstrate a 30 second left single leg stance in order to progress to dynamic single leg strength and stability    Baseline Patient able to maintain SLS > 30 sec bilaterally - 03/26/21    Time 6    Period Weeks    Status Achieved      PT LONG TERM GOAL #3   Title Patient will demonstrate 5/5 MMT of left knee in order to normalize running and landing mechanics    Baseline grossly 4+/5 MMT left knee - 06/23/21    Time 8    Period Weeks    Status Revised      PT LONG TERM GOAL #4   Title Patient will demonstrate >/= 4+/5 MMT hip strength to improve dynamic knee control with landing while running or jumping    Baseline grossly 4/5 MMT - 06/23/21    Time 8    Period Weeks    Status On-going      PT LONG TERM GOAL #5   Title Patient will be able to perform single leg squat x10 without deviation to demonstrate appropriate motor control    Baseline patient able to perform 10 reps SLS to 22" surface without deviation    Time 8    Period Weeks    Status Achieved      PT LONG TERM GOAL #6   Title Patient will report no increased pain or swelling with PT related activity or exercise in order to progress strength and to dynamic plyometric tasks    Baseline Patient denies any soreness or swelling following PT or activity at this time - 04/28/21    Time 6    Period Weeks    Status Achieved  PT LONG TERM GOAL #7   Title Patient will be able to run at self selected speed and without limitation or increase in pain in order to return to active lifestyle and exercise    Baseline Patient tolerating running progression at lower speeds, she does demonstrate deviations with landing - 06/23/21     Time 8    Period Weeks    Status On-going      PT LONG TERM GOAL #8   Title Patient will report >/= 79/80 on LEFS to indicate return to prior level of functional activity    Baseline LEFS 70/80 - 06/23/21    Time 8    Period Weeks    Status On-going                   Plan - 07/10/21 0839     Clinical Impression Statement Pt reports running program is going well. She started last week and had some initial tightness so rested for a couple of days and has resumed without increased tightness. Continued with knee strength and stability as well as plyometrics. Pt tolerated the session well without increased pain, only fatigue.    PT Next Visit Plan Progress HEP, progress strengthening and plyometrics as tolerated, dynamic balance activity    PT Home Exercise Plan [id number]    Consulted and Agree with Plan of Care Patient    Family Member Consulted mother             Patient will benefit from skilled therapeutic intervention in order to improve the following deficits and impairments:  Abnormal gait, Difficulty walking, Decreased safety awareness, Decreased activity tolerance, Decreased strength, Decreased mobility, Increased edema, Pain  Visit Diagnosis: Chronic pain of left knee  Stiffness of left knee, not elsewhere classified  Other abnormalities of gait and mobility  Rupture of anterior cruciate ligament of left knee, subsequent encounter     Problem List There are no problems to display for this patient.   Sherrie Mustache, [place] 07/10/2021, 8:43 AM  [facility] [address] [place], [place], [address] Phone: [phone]   Fax:  [phone]  Name: Cheryl Cole MRN: [id number] Date of Birth: [birth year]

## 2021-07-17 ENCOUNTER — Encounter: Payer: Self-pay | Admitting: Physical Therapy

## 2021-07-17 ENCOUNTER — Other Ambulatory Visit: Payer: Self-pay

## 2021-07-17 ENCOUNTER — Ambulatory Visit: Payer: 59 | Admitting: Physical Therapy

## 2021-07-17 DIAGNOSIS — M25562 Pain in left knee: Secondary | ICD-10-CM | POA: Diagnosis not present

## 2021-07-17 DIAGNOSIS — R2689 Other abnormalities of gait and mobility: Secondary | ICD-10-CM

## 2021-07-17 DIAGNOSIS — S83512D Sprain of anterior cruciate ligament of left knee, subsequent encounter: Secondary | ICD-10-CM

## 2021-07-17 DIAGNOSIS — G8929 Other chronic pain: Secondary | ICD-10-CM

## 2021-07-17 DIAGNOSIS — M25662 Stiffness of left knee, not elsewhere classified: Secondary | ICD-10-CM

## 2021-07-17 NOTE — Therapy (Signed)
[facility] Outpatient Rehabilitation [facility] [address] [place], [place], [address] Phone: [phone]   Fax:  [phone]  Physical Therapy Treatment  Patient Details  Name: Cheryl Cole. [place] MRN: [id number] Date of Birth: 2001/04/11 Referring Provider (PT): DR Ramond Marrow   Encounter Date: 07/17/2021   PT End of Session - 07/17/21 0917     Visit Number 31    Number of Visits 35    Date for PT Re-Evaluation 08/18/21    Authorization Type UHC MCD    Authorization Time Period 06/19/2021 - 08/15/2021    Authorization - Visit Number 4    Authorization - Number of Visits 8    PT Start Time 0915    PT Stop Time 1000    PT Time Calculation (min) 45 min    Activity Tolerance Patient tolerated treatment well    Behavior During Therapy [facility] for tasks assessed/performed             Past Medical History:  Diagnosis Date   Torn meniscus     Past Surgical History:  Procedure Laterality Date   KNEE ARTHROSCOPY WITH ANTERIOR CRUCIATE LIGAMENT (ACL) REPAIR Left 02/05/2021   Procedure: KNEE ARTHROSCOPY WITH ANTERIOR CRUCIATE LIGAMENT (ACL) REPAIR WITH AUTOGRAFT;  Surgeon: Bjorn Pippin, MD;  Location: [facility];  Service: Orthopedics;  Laterality: Left;   KNEE ARTHROSCOPY WITH LATERAL MENISECTOMY  02/05/2021   Procedure: KNEE ARTHROSCOPY WITH LATERAL MENISECTOMY;  Surgeon: Bjorn Pippin, MD;  Location: [facility];  Service: Orthopedics;;   KNEE ARTHROSCOPY WITH MEDIAL MENISECTOMY Left 02/05/2021   Procedure: KNEE ARTHROSCOPY WITH  MEDIAL MENISCUS REPAIR;  Surgeon: Bjorn Pippin, MD;  Location: [facility];  Service: Orthopedics;  Laterality: Left;   NO PAST SURGERIES      There were no vitals filed for this visit.   Subjective Assessment - 07/17/21 0916     Subjective Patient reports she is doing well. States her knee is feeling good, no pain or stiffness with her running program. She does get tired with her running.     Patient Stated Goals Get back to previous level of activity    Currently in Pain? No/denies                [facility] PT Assessment - 07/17/21 0001       Strength   Left Knee Flexion 4+/5    Left Knee Extension 4+/5                           [facility] Adult PT Treatment/Exercise - 07/17/21 0001       Exercises   Exercises Knee/Hip             [facility] Adult PT Treatment/Exercise:  Therapeutic Exercise:  - Elliptical L5 R5 x 5 min  - Plyometrics:  - SL Fwd/bwd, lateral line POGO hops 2 x 30 each  - SL box cw/ccw line POGO hops 2 x 5 each  - Triple hop x 5  - SL hurdle jump 2 x 5 - taking off with SL and landing DL, then taking off DL and landing SL  - SL leg press machine (cybex):  - Left: 80# 3 x 6 - Right: 120# 3 x 6  -SL knee extension machine:  - Left: 20# 3 x 6  - Right: 35# 3 x 6  - Kickstand SL RDL with 15# dumbbells 3 x 8  - [place] split  squat with 15# dumbbells 3 x 6   Manual Therapy: - NA  Neuromuscular re-ed: - NA  Therapeutic Activity: - NA         PT Education - 07/17/21 0917     Education Details HEP, adding weight/dumbbells to exercises for strengthening    Person(s) Educated Patient    Methods Explanation;Demonstration;Verbal cues    Comprehension Verbalized understanding;Returned demonstration;Verbal cues required;Need further instruction              PT Short Term Goals - 03/26/21 1053       PT SHORT TERM GOAL #1   Title Patient will increase knee flexion to 90 degrees    Baseline 120 - 03/26/21    Time 4    Period Weeks    Status Achieved    Target Date 03/07/21      PT SHORT TERM GOAL #2   Title Patient will demonstrate full extension    Baseline 0 deg knee extension AROM - 03/26/21    Time 4    Period Weeks    Status Achieved    Target Date 03/07/21      PT SHORT TERM GOAL #3   Title Patient will ambualte 300' without a device    Baseline no longer using AD with ambulation - 03/26/21     Time 4    Period Weeks    Status Achieved    Target Date 03/07/21               PT Long Term Goals - 07/10/21 0807       PT LONG TERM GOAL #1   Title Patient will be indeepdent with exercise program to promote quad strength and single leg stability    Baseline HEP continues to be updated for strengthening and plyometric progression    Time 8    Period Weeks    Status On-going      PT LONG TERM GOAL #2   Title Patient will demonstrate a 30 second left single leg stance in order to progress to dynamic single leg strength and stability    Baseline Patient able to maintain SLS > 30 sec bilaterally - 03/26/21    Time 6    Period Weeks    Status Achieved      PT LONG TERM GOAL #3   Title Patient will demonstrate 5/5 MMT of left knee in order to normalize running and landing mechanics    Baseline grossly 4+/5 MMT left knee - 06/23/21    Time 8    Period Weeks    Status Revised      PT LONG TERM GOAL #4   Title Patient will demonstrate >/= 4+/5 MMT hip strength to improve dynamic knee control with landing while running or jumping    Baseline grossly 4/5 MMT - 06/23/21    Time 8    Period Weeks    Status On-going      PT LONG TERM GOAL #5   Title Patient will be able to perform single leg squat x10 without deviation to demonstrate appropriate motor control    Baseline patient able to perform 10 reps SLS to 22" surface without deviation    Time 8    Period Weeks    Status Achieved      PT LONG TERM GOAL #6   Title Patient will report no increased pain or swelling with PT related activity or exercise in order to progress strength and to dynamic plyometric tasks  Baseline Patient denies any soreness or swelling following PT or activity at this time - 04/28/21    Time 6    Period Weeks    Status Achieved      PT LONG TERM GOAL #7   Title Patient will be able to run at self selected speed and without limitation or increase in pain in order to return to active lifestyle and  exercise    Baseline Patient tolerating running progression at lower speeds, she does demonstrate deviations with landing - 06/23/21    Time 8    Period Weeks    Status On-going      PT LONG TERM GOAL #8   Title Patient will report >/= 79/80 on LEFS to indicate return to prior level of functional activity    Baseline LEFS 70/80 - 06/23/21    Time 8    Period Weeks    Status On-going                   Plan - 07/17/21 2956     Clinical Impression Statement Patient tolerated therapy well with no adverse effects. Continued plyometric progress for SL, attempted hurdle jump but patient exhibits hesitation so broke up the exercise into taking off on SL and then landing on SL. Patient continues to demonstrate strength deficit on left and decreased jump/landing control. She was encouraged to add weight to strengthening exercises at home. Patient would benefit from continued skilled PT to progress her strength and plyometric control in order to return to activities such as running and volleyball without limitation.    PT Treatment/Interventions ADLs/Self Care Home Management;Electrical Stimulation;Ultrasound;Gait training;Stair training;Functional mobility training;Therapeutic activities;Therapeutic exercise;Patient/family education;Manual techniques;Passive range of motion;Scar mobilization;Taping    PT Next Visit Plan Progress HEP, progress strengthening and plyometrics as tolerated, dynamic balance activity    PT Home Exercise Plan [id number]    Consulted and Agree with Plan of Care Patient             Patient will benefit from skilled therapeutic intervention in order to improve the following deficits and impairments:  Abnormal gait, Difficulty walking, Decreased safety awareness, Decreased activity tolerance, Decreased strength, Decreased mobility, Increased edema, Pain  Visit Diagnosis: Chronic pain of left knee  Stiffness of left knee, not elsewhere classified  Other  abnormalities of gait and mobility  Rupture of anterior cruciate ligament of left knee, subsequent encounter     Problem List There are no problems to display for this patient.   Rosana Hoes, PT, DPT, LAT, ATC 07/17/21  10:01 AM Phone: [phone] Fax: [phone]   [facility] Outpatient Rehabilitation [facility] [address] [place], [place], [address] Phone: [phone]   Fax:  [phone]  Name: Leanny Moeckel. [place] MRN: [id number] Date of Birth: 04/15/2001

## 2021-07-24 ENCOUNTER — Encounter: Payer: Self-pay | Admitting: Physical Therapy

## 2021-07-24 ENCOUNTER — Ambulatory Visit: Payer: 59 | Admitting: Physical Therapy

## 2021-07-24 ENCOUNTER — Other Ambulatory Visit: Payer: Self-pay

## 2021-07-24 DIAGNOSIS — S83512D Sprain of anterior cruciate ligament of left knee, subsequent encounter: Secondary | ICD-10-CM

## 2021-07-24 DIAGNOSIS — R2689 Other abnormalities of gait and mobility: Secondary | ICD-10-CM

## 2021-07-24 DIAGNOSIS — M25562 Pain in left knee: Secondary | ICD-10-CM | POA: Diagnosis not present

## 2021-07-24 DIAGNOSIS — G8929 Other chronic pain: Secondary | ICD-10-CM

## 2021-07-24 DIAGNOSIS — M25662 Stiffness of left knee, not elsewhere classified: Secondary | ICD-10-CM

## 2021-07-24 NOTE — Therapy (Signed)
[facility] Outpatient Rehabilitation [facility] [address] [place], [place], [address] Phone: [phone]   Fax:  [phone]  Physical Therapy Treatment  Patient Details  Name: Cheryl Cole. Llorente MRN: [id number] Date of Birth: 2001/03/06 Referring Provider (PT): DR Ramond Marrow   Encounter Date: 07/24/2021   PT End of Session - 07/24/21 0925     Visit Number 32    Number of Visits 35    Date for PT Re-Evaluation 08/18/21    Authorization Type [employer] MCD    Authorization Time Period 06/19/2021 - 08/15/2021    Authorization - Visit Number 5    Authorization - Number of Visits 8    PT Start Time 0915    PT Stop Time 1005    PT Time Calculation (min) 50 min    Activity Tolerance Patient tolerated treatment well    Behavior During Therapy [facility] for tasks assessed/performed             Past Medical History:  Diagnosis Date   Torn meniscus     Past Surgical History:  Procedure Laterality Date   KNEE ARTHROSCOPY WITH ANTERIOR CRUCIATE LIGAMENT (ACL) REPAIR Left 02/05/2021   Procedure: KNEE ARTHROSCOPY WITH ANTERIOR CRUCIATE LIGAMENT (ACL) REPAIR WITH AUTOGRAFT;  Surgeon: Bjorn Pippin, MD;  Location: [facility];  Service: Orthopedics;  Laterality: Left;   KNEE ARTHROSCOPY WITH LATERAL MENISECTOMY  02/05/2021   Procedure: KNEE ARTHROSCOPY WITH LATERAL MENISECTOMY;  Surgeon: Bjorn Pippin, MD;  Location: [facility];  Service: Orthopedics;;   KNEE ARTHROSCOPY WITH MEDIAL MENISECTOMY Left 02/05/2021   Procedure: KNEE ARTHROSCOPY WITH  MEDIAL MENISCUS REPAIR;  Surgeon: Bjorn Pippin, MD;  Location: [facility];  Service: Orthopedics;  Laterality: Left;   NO PAST SURGERIES      There were no vitals filed for this visit.   Subjective Assessment - 07/24/21 0920     Subjective Patient reports she has been doing well. She has been consistent with her running program and has added weights to her exercises. She states she has noticed  getting stronger and more muscle definition, running has gotten easier and feels less stiff with landing.    Patient Stated Goals Get back to previous level of activity    Currently in Pain? No/denies                               [facility] Adult PT Treatment/Exercise - 07/24/21 0001       Exercises   Exercises Knee/Hip            [facility] Adult PT Treatment/Exercise:   Therapeutic Exercise:   - Elliptical L5 R5 x 5 min   - Plyometrics:             - SL hurdle jump 2 x 5 - taking off with SL and landing DL, then taking off DL and landing SL  - SL box jump 8" 2 x 5  - SL depth landing 16" x 5  - SL vertical jump x 5   - SL leg press machine (cybex):  - Left: 80# x 6, 85# 2 x 6 - Right: 120# 3 x 6   - Goblet squat 25# with heel prop 3 x 8  - Deadlift barbell 75# 3 x 8  - Lateral heel tap 12" with TRX assist 3 x 6  - Lateral band walk with black mid shin 3 x 30  -  Blood flow restriction:  - Knee extension machine 5# - 30, 15, 15, 15 with 30 sec rest  - LOP: 220 mmHg  - 80%: 176 mmHg  Manual Therapy: - NA   Neuromuscular re-ed: - NA   Therapeutic Activity: - NA         PT Education - 07/24/21 0922     Education Details HEP    Person(s) Educated Patient    Methods Explanation;Demonstration;Verbal cues    Comprehension Verbalized understanding;Returned demonstration;Verbal cues required;Need further instruction              PT Short Term Goals - 03/26/21 1053       PT SHORT TERM GOAL #1   Title Patient will increase knee flexion to 90 degrees    Baseline 120 - 03/26/21    Time 4    Period Weeks    Status Achieved    Target Date 03/07/21      PT SHORT TERM GOAL #2   Title Patient will demonstrate full extension    Baseline 0 deg knee extension AROM - 03/26/21    Time 4    Period Weeks    Status Achieved    Target Date 03/07/21      PT SHORT TERM GOAL #3   Title Patient will ambualte 300' without a device    Baseline no  longer using AD with ambulation - 03/26/21    Time 4    Period Weeks    Status Achieved    Target Date 03/07/21               PT Long Term Goals - 07/10/21 0807       PT LONG TERM GOAL #1   Title Patient will be indeepdent with exercise program to promote quad strength and single leg stability    Baseline HEP continues to be updated for strengthening and plyometric progression    Time 8    Period Weeks    Status On-going      PT LONG TERM GOAL #2   Title Patient will demonstrate a 30 second left single leg stance in order to progress to dynamic single leg strength and stability    Baseline Patient able to maintain SLS > 30 sec bilaterally - 03/26/21    Time 6    Period Weeks    Status Achieved      PT LONG TERM GOAL #3   Title Patient will demonstrate 5/5 MMT of left knee in order to normalize running and landing mechanics    Baseline grossly 4+/5 MMT left knee - 06/23/21    Time 8    Period Weeks    Status Revised      PT LONG TERM GOAL #4   Title Patient will demonstrate >/= 4+/5 MMT hip strength to improve dynamic knee control with landing while running or jumping    Baseline grossly 4/5 MMT - 06/23/21    Time 8    Period Weeks    Status On-going      PT LONG TERM GOAL #5   Title Patient will be able to perform single leg squat x10 without deviation to demonstrate appropriate motor control    Baseline patient able to perform 10 reps SLS to 22" surface without deviation    Time 8    Period Weeks    Status Achieved      PT LONG TERM GOAL #6   Title Patient will report no increased pain or swelling with PT  related activity or exercise in order to progress strength and to dynamic plyometric tasks    Baseline Patient denies any soreness or swelling following PT or activity at this time - 04/28/21    Time 6    Period Weeks    Status Achieved      PT LONG TERM GOAL #7   Title Patient will be able to run at self selected speed and without limitation or increase in  pain in order to return to active lifestyle and exercise    Baseline Patient tolerating running progression at lower speeds, she does demonstrate deviations with landing - 06/23/21    Time 8    Period Weeks    Status On-going      PT LONG TERM GOAL #8   Title Patient will report >/= 79/80 on LEFS to indicate return to prior level of functional activity    Baseline LEFS 70/80 - 06/23/21    Time 8    Period Weeks    Status On-going                   Plan - 07/24/21 0926     Clinical Impression Statement Patient tolerated therapy well with no adverse effects. She continues to have difficulty with landing on SL from height and with hurdle jump, she was encouraged. She is progressing well with her strengthening and use of BFR at end of session for further progress quad strength. Patient would benefit from continued skilled PT to progress her strength and plyometric control in order to return to activities such as running and volleyball without limitation.    PT Treatment/Interventions ADLs/Self Care Home Management;Electrical Stimulation;Ultrasound;Gait training;Stair training;Functional mobility training;Therapeutic activities;Therapeutic exercise;Patient/family education;Manual techniques;Passive range of motion;Scar mobilization;Taping    PT Next Visit Plan Progress HEP, progress strengthening and plyometrics as tolerated, dynamic balance activity    PT Home Exercise Plan [id number]    Consulted and Agree with Plan of Care Patient             Patient will benefit from skilled therapeutic intervention in order to improve the following deficits and impairments:  Abnormal gait, Difficulty walking, Decreased safety awareness, Decreased activity tolerance, Decreased strength, Decreased mobility, Increased edema, Pain  Visit Diagnosis: Chronic pain of left knee  Stiffness of left knee, not elsewhere classified  Other abnormalities of gait and mobility  Rupture of anterior cruciate  ligament of left knee, subsequent encounter     Problem List There are no problems to display for this patient.   Rosana Hoes, PT, DPT, LAT, ATC 07/24/21  10:15 AM Phone: [phone] Fax: [phone]   [facility] Outpatient Rehabilitation [facility] [address] [place], [place], [address] Phone: [phone]   Fax:  [phone]  Name: Iverna Hammac. Keltner MRN: [id number] Date of Birth: 08-Jun-2001

## 2021-07-29 ENCOUNTER — Encounter: Payer: Self-pay | Admitting: Physical Therapy

## 2021-07-29 ENCOUNTER — Other Ambulatory Visit: Payer: Self-pay

## 2021-07-29 ENCOUNTER — Ambulatory Visit: Payer: 59 | Admitting: Physical Therapy

## 2021-07-29 DIAGNOSIS — G8929 Other chronic pain: Secondary | ICD-10-CM

## 2021-07-29 DIAGNOSIS — R2689 Other abnormalities of gait and mobility: Secondary | ICD-10-CM

## 2021-07-29 DIAGNOSIS — M25562 Pain in left knee: Secondary | ICD-10-CM

## 2021-07-29 DIAGNOSIS — M25662 Stiffness of left knee, not elsewhere classified: Secondary | ICD-10-CM

## 2021-07-29 DIAGNOSIS — S83512D Sprain of anterior cruciate ligament of left knee, subsequent encounter: Secondary | ICD-10-CM

## 2021-07-29 NOTE — Therapy (Signed)
[facility] Outpatient Rehabilitation [facility] [address] [place], [place], [address] Phone: [phone]   Fax:  [phone]  Physical Therapy Treatment  Patient Details  Name: Cheryl Cole MRN: [id number] Date of Birth: [birth year] Referring Provider (PT): DR Ramond Marrow   Encounter Date: 07/29/2021   PT End of Session - 07/29/21 0832     Visit Number 33    Number of Visits 35    Date for PT Re-Evaluation 08/18/21    Authorization Type [employer] MCD    Authorization Time Period 06/19/2021 - 08/15/2021    Authorization - Visit Number 6    Authorization - Number of Visits 8    PT Start Time 0830    PT Stop Time 0920    PT Time Calculation (min) 50 min    Activity Tolerance Patient tolerated treatment well    Behavior During Therapy [facility] for tasks assessed/performed             Past Medical History:  Diagnosis Date   Torn meniscus     Past Surgical History:  Procedure Laterality Date   KNEE ARTHROSCOPY WITH ANTERIOR CRUCIATE LIGAMENT (ACL) REPAIR Left 02/05/2021   Procedure: KNEE ARTHROSCOPY WITH ANTERIOR CRUCIATE LIGAMENT (ACL) REPAIR WITH AUTOGRAFT;  Surgeon: Bjorn Pippin, MD;  Location: [facility];  Service: Orthopedics;  Laterality: Left;   KNEE ARTHROSCOPY WITH LATERAL MENISECTOMY  02/05/2021   Procedure: KNEE ARTHROSCOPY WITH LATERAL MENISECTOMY;  Surgeon: Bjorn Pippin, MD;  Location: [facility];  Service: Orthopedics;;   KNEE ARTHROSCOPY WITH MEDIAL MENISECTOMY Left 02/05/2021   Procedure: KNEE ARTHROSCOPY WITH  MEDIAL MENISCUS REPAIR;  Surgeon: Bjorn Pippin, MD;  [facility];  Service: Orthopedics;  Laterality: Left;   NO PAST SURGERIES      There were no vitals filed for this visit.   Subjective Assessment - 07/29/21 0830     Subjective Patient reports she is going good. She has been doing the jumping and she is still working on landing from a higher level with proper mechanics. She arrives  wearing crocs today.    Patient Stated Goals Get back to previous level of activity    Currently in Pain? No/denies                [facility] PT Assessment - 07/29/21 0001       Strength   Left Knee Flexion 4+/5    Left Knee Extension 4+/5                           [facility] Adult PT Treatment/Exercise - 07/29/21 0001       Exercises   Exercises Knee/Hip             [facility] Adult PT Treatment/Exercise:   Therapeutic Exercise:   - Elliptical L5 R5 x 5 min  - Single leg bridge with p-ball hamstring curl 3 x 8 - Side plank hip taps 3 x 10 - Bulgarian squat with 30# contra 3 x 6  - Knee extension machine 15# 3 x 8 - Knee flexion machine 25# 3 x 8 - Lateral band walk with black mid shin 3 x 20  - Deadlift barbell 95# 3 x 8 - Lateral heel tap 12" with TRX assist 3 x 6   - Blood flow restriction:             - SL leg press (cybex)  - 30, 15, 15,  15 with 30 sec rest             - LOP: 234 mmHg             - 80%: 186 mmHg   Manual Therapy: - NA   Neuromuscular re-ed: - SL balance on BOSO with triple cone tap 3 x 5   Therapeutic Activity: - NA        PT Education - 07/29/21 0832     Education Details HEP    Person(s) Educated Patient    Methods Explanation;Demonstration;Verbal cues    Comprehension Verbalized understanding;Returned demonstration;Verbal cues required;Need further instruction              PT Short Term Goals - 03/26/21 1053       PT SHORT TERM GOAL #1   Title Patient will increase knee flexion to 90 degrees    Baseline 120 - 03/26/21    Time 4    Period Weeks    Status Achieved    Target Date 03/07/21      PT SHORT TERM GOAL #2   Title Patient will demonstrate full extension    Baseline 0 deg knee extension AROM - 03/26/21    Time 4    Period Weeks    Status Achieved    Target Date 03/07/21      PT SHORT TERM GOAL #3   Title Patient will ambualte 300' without a device    Baseline no longer using AD with  ambulation - 03/26/21    Time 4    Period Weeks    Status Achieved    Target Date 03/07/21               PT Long Term Goals - 07/10/21 0807       PT LONG TERM GOAL #1   Title Patient will be indeepdent with exercise program to promote quad strength and single leg stability    Baseline HEP continues to be updated for strengthening and plyometric progression    Time 8    Period Weeks    Status On-going      PT LONG TERM GOAL #2   Title Patient will demonstrate a 30 second left single leg stance in order to progress to dynamic single leg strength and stability    Baseline Patient able to maintain SLS > 30 sec bilaterally - 03/26/21    Time 6    Period Weeks    Status Achieved      PT LONG TERM GOAL #3   Title Patient will demonstrate 5/5 MMT of left knee in order to normalize running and landing mechanics    Baseline grossly 4+/5 MMT left knee - 06/23/21    Time 8    Period Weeks    Status Revised      PT LONG TERM GOAL #4   Title Patient will demonstrate >/= 4+/5 MMT hip strength to improve dynamic knee control with landing while running or jumping    Baseline grossly 4/5 MMT - 06/23/21    Time 8    Period Weeks    Status On-going      PT LONG TERM GOAL #5   Title Patient will be able to perform single leg squat x10 without deviation to demonstrate appropriate motor control    Baseline patient able to perform 10 reps SLS to 22" surface without deviation    Time 8    Period Weeks    Status Achieved  PT LONG TERM GOAL #6   Title Patient will report no increased pain or swelling with PT related activity or exercise in order to progress strength and to dynamic plyometric tasks    Baseline Patient denies any soreness or swelling following PT or activity at this time - 04/28/21    Time 6    Period Weeks    Status Achieved      PT LONG TERM GOAL #7   Title Patient will be able to run at self selected speed and without limitation or increase in pain in order to return  to active lifestyle and exercise    Baseline Patient tolerating running progression at lower speeds, she does demonstrate deviations with landing - 06/23/21    Time 8    Period Weeks    Status On-going      PT LONG TERM GOAL #8   Title Patient will report >/= 79/80 on LEFS to indicate return to prior level of functional activity    Baseline LEFS 70/80 - 06/23/21    Time 8    Period Weeks    Status On-going                   Plan - 07/29/21 4650     Clinical Impression Statement Patient tolerated therapy well with no adverse effects. No plyometrics performed this visit due to patient wearing crocs to therapy. Continued focus on strengthening and balance training this visit. Overall patient is progressing well with her strength and dynamic single leg control, however continues to exhibit strength deficit compared to the opposite limb. No increased pain with therapy, but patient did exhibit fatigue. She was encouraged continue to progress resistance/weight with HEP and continue with hopping/jogging program. Patient would benefit from continued skilled PT to progress her strength and plyometric control in order to return to activities such as running and volleyball without limitation.    PT Treatment/Interventions ADLs/Self Care Home Management;Electrical Stimulation;Ultrasound;Gait training;Stair training;Functional mobility training;Therapeutic activities;Therapeutic exercise;Patient/family education;Manual techniques;Passive range of motion;Scar mobilization;Taping    PT Next Visit Plan Progress HEP, progress strengthening and plyometrics as tolerated, dynamic balance activity    PT Home Exercise Plan [id number]    Consulted and Agree with Plan of Care Patient             Patient will benefit from skilled therapeutic intervention in order to improve the following deficits and impairments:  Abnormal gait, Difficulty walking, Decreased safety awareness, Decreased activity tolerance,  Decreased strength, Decreased mobility, Increased edema, Pain  Visit Diagnosis: Chronic pain of left knee  Stiffness of left knee, not elsewhere classified  Other abnormalities of gait and mobility  Rupture of anterior cruciate ligament of left knee, subsequent encounter     Problem List There are no problems to display for this patient.   Rosana Hoes, PT, DPT, LAT, ATC 07/29/21  9:21 AM Phone: [phone] Fax: [phone]   [facility] Outpatient [facility] [address] [place], [place], [address] Phone: [phone]   Fax:  [phone]  Name: Cheryl Cole MRN: [id number] Date of Birth: 07-Jan-2001

## 2021-07-31 ENCOUNTER — Encounter: Payer: PRIVATE HEALTH INSURANCE | Admitting: Physical Therapy

## 2021-08-05 ENCOUNTER — Encounter: Payer: Self-pay | Admitting: Physical Therapy

## 2021-08-05 ENCOUNTER — Ambulatory Visit: Payer: 59 | Attending: Orthopaedic Surgery | Admitting: Physical Therapy

## 2021-08-05 ENCOUNTER — Other Ambulatory Visit: Payer: Self-pay

## 2021-08-05 DIAGNOSIS — M25562 Pain in left knee: Secondary | ICD-10-CM

## 2021-08-05 DIAGNOSIS — M25662 Stiffness of left knee, not elsewhere classified: Secondary | ICD-10-CM | POA: Diagnosis present

## 2021-08-05 DIAGNOSIS — S83512D Sprain of anterior cruciate ligament of left knee, subsequent encounter: Secondary | ICD-10-CM | POA: Insufficient documentation

## 2021-08-05 DIAGNOSIS — R2689 Other abnormalities of gait and mobility: Secondary | ICD-10-CM | POA: Diagnosis present

## 2021-08-05 DIAGNOSIS — G8929 Other chronic pain: Secondary | ICD-10-CM | POA: Diagnosis present

## 2021-08-05 NOTE — Therapy (Signed)
[facility] Outpatient Rehabilitation [facility] [address] [place], [place], [address] Phone: [phone]   Fax:  [phone]  Physical Therapy Treatment  Patient Details  Name: Cheryl Cole. Cheryl Cole MRN: [id number] Date of Birth: 04/17/2001 Referring Provider (PT): DR Ramond Marrow   Encounter Date: 08/05/2021   PT End of Session - 08/05/21 0834     Visit Number 34    Number of Visits 35    Date for PT Re-Evaluation 08/18/21    Authorization Type [employer] MCD    Authorization Time Period 06/19/2021 - 08/15/2021    Authorization - Visit Number 7    Authorization - Number of Visits 8    PT Start Time 0830    PT Stop Time 0915    PT Time Calculation (min) 45 min    Activity Tolerance Patient tolerated treatment well    Behavior During Therapy [facility] for tasks assessed/performed             Past Medical History:  Diagnosis Date   Torn meniscus     Past Surgical History:  Procedure Laterality Date   KNEE ARTHROSCOPY WITH ANTERIOR CRUCIATE LIGAMENT (ACL) REPAIR Left 02/05/2021   Procedure: KNEE ARTHROSCOPY WITH ANTERIOR CRUCIATE LIGAMENT (ACL) REPAIR WITH AUTOGRAFT;  Surgeon: Bjorn Pippin, MD;  Location: [facility];  Service: Orthopedics;  Laterality: Left;   KNEE ARTHROSCOPY WITH LATERAL MENISECTOMY  02/05/2021   Procedure: KNEE ARTHROSCOPY WITH LATERAL MENISECTOMY;  Surgeon: Bjorn Pippin, MD;  Location: [facility];  Service: Orthopedics;;   KNEE ARTHROSCOPY WITH MEDIAL MENISECTOMY Left 02/05/2021   Procedure: KNEE ARTHROSCOPY WITH  MEDIAL MENISCUS REPAIR;  Surgeon: Bjorn Pippin, MD;  Location: [facility];  Service: Orthopedics;  Laterality: Left;   NO PAST SURGERIES      There were no vitals filed for this visit.   Subjective Assessment - 08/05/21 0832     Subjective Patient reports she has been progressing well with her hopping at is able to jump over a crock, but has trouble sticking the landing. She continues to work on  her strengthening, adding weight to the exercises.    Patient Stated Goals Get back to previous level of activity    Currently in Pain? No/denies                            [facility] Adult PT Treatment/Exercise:   Therapeutic Exercise:   - Elliptical L5 R5 x 5 min   Plyometrics: - DL and SL line hops fwd/bwd and lateral x 30 each - DL hurdle jump x 10 - DL 90 deg hurdle jump x 5 each consecutive hurdle jump  - DL drop jump from 8" box 2 x 5 - DL consecutive hurdle jump 2 x 5 - SL jump over yoga block 2 x 5  - Bulgarian squat with 20# bilat 3 x 6 - Deadlift barbell 95# 3 x 8   Manual Therapy: - NA   Neuromuscular re-ed: - NA   Therapeutic Activity: - NA            PT Education - 08/05/21 0833     Education Details HEP, continue progression of SL jumping and strengthening    Person(s) Educated Patient    Methods Explanation;Demonstration;Verbal cues    Comprehension Verbalized understanding;Returned demonstration;Verbal cues required;Need further instruction              PT Short Term Goals - 03/26/21 1053  PT SHORT TERM GOAL #1   Title Patient will increase knee flexion to 90 degrees    Baseline 120 - 03/26/21    Time 4    Period Weeks    Status Achieved    Target Date 03/07/21      PT SHORT TERM GOAL #2   Title Patient will demonstrate full extension    Baseline 0 deg knee extension AROM - 03/26/21    Time 4    Period Weeks    Status Achieved    Target Date 03/07/21      PT SHORT TERM GOAL #3   Title Patient will ambualte 300' without a device    Baseline no longer using AD with ambulation - 03/26/21    Time 4    Period Weeks    Status Achieved    Target Date 03/07/21               PT Long Term Goals - 07/10/21 0807       PT LONG TERM GOAL #1   Title Patient will be indeepdent with exercise program to promote quad strength and single leg stability    Baseline HEP continues to be updated for strengthening and  plyometric progression    Time 8    Period Weeks    Status On-going      PT LONG TERM GOAL #2   Title Patient will demonstrate a 30 second left single leg stance in order to progress to dynamic single leg strength and stability    Baseline Patient able to maintain SLS > 30 sec bilaterally - 03/26/21    Time 6    Period Weeks    Status Achieved      PT LONG TERM GOAL #3   Title Patient will demonstrate 5/5 MMT of left knee in order to normalize running and landing mechanics    Baseline grossly 4+/5 MMT left knee - 06/23/21    Time 8    Period Weeks    Status Revised      PT LONG TERM GOAL #4   Title Patient will demonstrate >/= 4+/5 MMT hip strength to improve dynamic knee control with landing while running or jumping    Baseline grossly 4/5 MMT - 06/23/21    Time 8    Period Weeks    Status On-going      PT LONG TERM GOAL #5   Title Patient will be able to perform single leg squat x10 without deviation to demonstrate appropriate motor control    Baseline patient able to perform 10 reps SLS to 22" surface without deviation    Time 8    Period Weeks    Status Achieved      PT LONG TERM GOAL #6   Title Patient will report no increased pain or swelling with PT related activity or exercise in order to progress strength and to dynamic plyometric tasks    Baseline Patient denies any soreness or swelling following PT or activity at this time - 04/28/21    Time 6    Period Weeks    Status Achieved      PT LONG TERM GOAL #7   Title Patient will be able to run at self selected speed and without limitation or increase in pain in order to return to active lifestyle and exercise    Baseline Patient tolerating running progression at lower speeds, she does demonstrate deviations with landing - 06/23/21    Time 8  Period Weeks    Status On-going      PT LONG TERM GOAL #8   Title Patient will report >/= 79/80 on LEFS to indicate return to prior level of functional activity    Baseline  LEFS 70/80 - 06/23/21    Time 8    Period Weeks    Status On-going                   Plan - 08/05/21 0834     Clinical Impression Statement Patient tolerated therapy well with no adverse effects. Therapy focused primarily on progressing her plyometrics with addition of some rotational jumps and drop landings to focus on quicker and more explosive movements. Patient does continue to exhibit slight weight shift toward the right leg but able to improve with cueing. She demonstrates hesitation with SL jumping over hurdle so used a yoga block with better results. Patient would benefit from continued skilled PT to progress her strength and plyometric control in order to return to activities such as running and volleyball without limitation.    PT Treatment/Interventions ADLs/Self Care Home Management;Electrical Stimulation;Ultrasound;Gait training;Stair training;Functional mobility training;Therapeutic activities;Therapeutic exercise;Patient/family education;Manual techniques;Passive range of motion;Scar mobilization;Taping    PT Next Visit Plan Progress HEP, progress strengthening and plyometrics as tolerated, dynamic balance activity    PT Home Exercise Plan [id number]    Consulted and Agree with Plan of Care Patient             Patient will benefit from skilled therapeutic intervention in order to improve the following deficits and impairments:  Abnormal gait, Difficulty walking, Decreased safety awareness, Decreased activity tolerance, Decreased strength, Decreased mobility, Increased edema, Pain  Visit Diagnosis: Chronic pain of left knee  Stiffness of left knee, not elsewhere classified  Other abnormalities of gait and mobility  Rupture of anterior cruciate ligament of left knee, subsequent encounter     Problem List There are no problems to display for this patient.   Rosana Hoes, PT, DPT, LAT, ATC 08/05/21  9:15 AM Phone: [phone] Fax: [phone]   [facility] Outpatient Rehabilitation [facility] [address] [place], [place], [address] Phone: [phone]   Fax:  [phone]  Name: Cheryl Cole. Cheryl Cole MRN: [id number] Date of Birth: Dec 09, 2000

## 2021-08-07 ENCOUNTER — Encounter: Payer: PRIVATE HEALTH INSURANCE | Admitting: Physical Therapy

## 2021-08-14 ENCOUNTER — Ambulatory Visit: Payer: 59 | Admitting: Physical Therapy

## 2021-08-14 ENCOUNTER — Encounter: Payer: Self-pay | Admitting: Physical Therapy

## 2021-08-14 ENCOUNTER — Other Ambulatory Visit: Payer: Self-pay

## 2021-08-14 DIAGNOSIS — G8929 Other chronic pain: Secondary | ICD-10-CM

## 2021-08-14 DIAGNOSIS — R2689 Other abnormalities of gait and mobility: Secondary | ICD-10-CM

## 2021-08-14 DIAGNOSIS — S83512D Sprain of anterior cruciate ligament of left knee, subsequent encounter: Secondary | ICD-10-CM

## 2021-08-14 DIAGNOSIS — M25662 Stiffness of left knee, not elsewhere classified: Secondary | ICD-10-CM

## 2021-08-14 DIAGNOSIS — M25562 Pain in left knee: Secondary | ICD-10-CM | POA: Diagnosis not present

## 2021-08-14 NOTE — Therapy (Signed)
[facility] Outpatient Rehabilitation [facility] [address] [place], [place], [address] Phone: [phone]   Fax:  [phone]  Physical Therapy Treatment / ERO  Patient Details  Name: Cheryl Cole MRN: [id number] Date of Birth: [birth year] Referring Provider (PT): Bjorn Pippin, MD   Encounter Date: 08/14/2021   PT End of Session - 08/14/21 0828     Visit Number 35    Number of Visits 43    Date for PT Re-Evaluation 10/09/21    Authorization Type [employer] MCD    Authorization Time Period 06/19/2021 - 08/15/2021, resubmitted on 08/14/2021    Authorization - Visit Number 8    Authorization - Number of Visits 8    PT Start Time 0825    PT Stop Time 0915    PT Time Calculation (min) 50 min    Activity Tolerance Patient tolerated treatment well    Behavior During Therapy [facility] for tasks assessed/performed             Past Medical History:  Diagnosis Date   Torn meniscus     Past Surgical History:  Procedure Laterality Date   KNEE ARTHROSCOPY WITH ANTERIOR CRUCIATE LIGAMENT (ACL) REPAIR Left 02/05/2021   Procedure: KNEE ARTHROSCOPY WITH ANTERIOR CRUCIATE LIGAMENT (ACL) REPAIR WITH AUTOGRAFT;  Surgeon: Bjorn Pippin, MD;  Location: [facility];  Service: Orthopedics;  Laterality: Left;   KNEE ARTHROSCOPY WITH LATERAL MENISECTOMY  02/05/2021   Procedure: KNEE ARTHROSCOPY WITH LATERAL MENISECTOMY;  Surgeon: Bjorn Pippin, MD;  Location: [facility];  Service: Orthopedics;;   KNEE ARTHROSCOPY WITH MEDIAL MENISECTOMY Left 02/05/2021   Procedure: KNEE ARTHROSCOPY WITH  MEDIAL MENISCUS REPAIR;  Surgeon: Bjorn Pippin, MD;  Location: [facility];  Service: Orthopedics;  Laterality: Left;   NO PAST SURGERIES      There were no vitals filed for this visit.   Subjective Assessment - 08/14/21 0826     Subjective Patient reports she is doing well with no issues. She has been working a lot on her jumping to build confidence up and  she feels like she is improving.    Patient Stated Goals Get back to previous level of activity    Currently in Pain? No/denies                [facility] PT Assessment - 08/14/21 0001       Assessment   Medical Diagnosis L ACL with BTB graft and Medial meniscal reapair    Referring Provider (PT) Bjorn Pippin, MD    Onset Date/Surgical Date 02/05/21      Precautions   Precautions None      Restrictions   Weight Bearing Restrictions No      Balance Screen   Has the patient fallen in the past 6 months No      Prior Function   Level of Independence Independent    Leisure Volleyball      Cognition   Overall Cognitive Status Within Functional Limits for tasks assessed      Observation/Other Assessments   Focus on Therapeutic Outcomes (FOTO)  NA - MCD    Lower Extremity Functional Scale  74/80      Functional Tests   Functional tests Running;Hopping;Jumping      Hopping   Comments SL hop demonstrates decreased quad strength and endurance with difficulty performing consecutive hops and with stiffer landing      Jumping   Comments DL jumping demonstrates weight shift toward  right, SL jumping with decreased height and distance and stiffer landing on left      Running   Comments Patient continues to demonstrate slight decreased knee flexion in stance, indicating decreased quad control with stiffer landing      AROM   Overall AROM Comments Knee AROM grossly WFL and non-painful, equal bilaterally      Strength   Right Hip Extension 4/5    Right Hip ABduction 4/5    Left Hip Extension 4/5    Left Hip ABduction 4/5    Right Knee Flexion 5/5    Right Knee Extension 5/5    Left Knee Flexion 4+/5    Left Knee Extension 4+/5                       [facility] Adult PT Treatment/Exercise:   Therapeutic Exercise:   - Elliptical L5 R5 x 5 min   Plyometrics: - DL and SL line hops fwd/bwd and lateral x 30 each - DL 8" box jump x 10 - SL 8" box jump x 10 bilat -  patient able to perform proper on approximately half the reps - SL hop over small dumbbell x 10 bilat - SL triple hop over small dumbbells x 5 bilat - Lateral hop on/off BOSO 2 x 5 bilat  Pilates reformer: - SL jumping 3 x 5 (2 red, 1 yellow, 1 blue)   - Bulgarian squat with 25# bilat 4 x 6 (8 reps on right) - Deadlift barbell 95# 3 x 8 - Lateral band walk with black around mid-shin 3 x 20 - LAQ 10# 3 x 8 (left only)   Manual Therapy: - NA   Neuromuscular re-ed: - NA   Therapeutic Activity: - NA             PT Education - 08/14/21 0827     Education Details HEP, continued running progression and progress jumping, continued increase in weight with resistance    Person(s) Educated Patient    Methods Explanation;Demonstration;Verbal cues    Comprehension Verbalized understanding;Returned demonstration;Verbal cues required;Need further instruction              PT Short Term Goals - 03/26/21 1053       PT SHORT TERM GOAL #1   Title Patient will increase knee flexion to 90 degrees    Baseline 120 - 03/26/21    Time 4    Period Weeks    Status Achieved    Target Date 03/07/21      PT SHORT TERM GOAL #2   Title Patient will demonstrate full extension    Baseline 0 deg knee extension AROM - 03/26/21    Time 4    Period Weeks    Status Achieved    Target Date 03/07/21      PT SHORT TERM GOAL #3   Title Patient will ambualte 300' without a device    Baseline no longer using AD with ambulation - 03/26/21    Time 4    Period Weeks    Status Achieved    Target Date 03/07/21               PT Long Term Goals - 08/14/21 1337       PT LONG TERM GOAL #1   Title Patient will be indeepdent with exercise program to promote quad strength and single leg stability    Baseline HEP continues to be updated for strengthening and plyometric progression, as well  as home running progression    Time 8    Period Weeks    Status On-going    Target Date 10/09/21       PT LONG TERM GOAL #2   Title Patient will demonstrate a 30 second left single leg stance in order to progress to dynamic single leg strength and stability    Baseline Patient able to maintain SLS > 30 sec bilaterally - 03/26/21    Status Achieved      PT LONG TERM GOAL #3   Title Patient will demonstrate 5/5 MMT of left knee in order to normalize running and landing mechanics    Baseline strength grossly 4+/5 MMT left knee, continues to exhibit strength deficit compared to opposite side resulting in deviations with jumping and landing    Time 8    Period Weeks    Status On-going    Target Date 10/09/21      PT LONG TERM GOAL #4   Title Patient will demonstrate >/= 4+/5 MMT hip strength to improve dynamic knee control with landing while running or jumping    Baseline hip strength grossly 4/5 MMT bilaterally    Time 8    Period Weeks    Status On-going    Target Date 10/09/21      PT LONG TERM GOAL #5   Title Patient will be able to perform single leg squat x10 without deviation to demonstrate appropriate motor control    Baseline patient able to perform 10 reps SLS to 22" surface without deviation    Status Achieved      PT LONG TERM GOAL #6   Title Patient will report no increased pain or swelling with PT related activity or exercise in order to progress strength and to dynamic plyometric tasks    Baseline Patient denies any soreness or swelling following PT or activity at this time - 04/28/21    Status Achieved      PT LONG TERM GOAL #7   Title Patient will be able to run at self selected speed and without limitation or increase in pain in order to return to active lifestyle and exercise    Baseline Patient is progressing well with running progression, she continues to demonstrate deviations with stiff landing on left and is running at slower pace for decreased time    Time 8    Period Weeks    Status On-going    Target Date 10/09/21      PT LONG TERM GOAL #8   Title Patient  will report >/= 79/80 on LEFS to indicate return to prior level of functional activity    Baseline LEFS 74/80, continues to have deficit with running/cutting/jumping related tasks    Time 8    Period Weeks    Status On-going    Target Date 10/09/21                   Plan - 08/14/21 0830     Clinical Impression Statement Patient tolerated therapy well with no adverse effects. Patient is 6 months and 1 week post left ACL reconstruction and progressing well in therapy. She is tolerating progressions in resistance for strengthening exercises, progressing with plyometrics on single leg, and performing running progression. Patient does continue to exhibit strength deficit of the quad and hamstring on the left compared to the right, stiffer landing on the left with deficit in jumping height on distance, and running deviations with stiffer landing on the left. She denies  any pain with therapy or running progression. Patient is making progress toward goals and would benefit from continued skilled PT to progress her strength and plyometric control in order to return to activities such as running and volleyball without limitation and with reduced risk of reinjury.    PT Frequency 1x / week    PT Duration 8 weeks    PT Treatment/Interventions ADLs/Self Care Home Management;Electrical Stimulation;Ultrasound;Gait training;Stair training;Functional mobility training;Therapeutic activities;Therapeutic exercise;Patient/family education;Manual techniques;Passive range of motion;Scar mobilization;Taping    PT Next Visit Plan Progress HEP, progress strengthening and plyometrics as tolerated, dynamic balance activity    PT Home Exercise Plan [id number]    Consulted and Agree with Plan of Care Patient             Patient will benefit from skilled therapeutic intervention in order to improve the following deficits and impairments:  Abnormal gait, Difficulty walking, Decreased safety awareness, Decreased  activity tolerance, Decreased strength, Decreased mobility, Increased edema, Pain  Visit Diagnosis: Chronic pain of left knee  Stiffness of left knee, not elsewhere classified  Other abnormalities of gait and mobility  Rupture of anterior cruciate ligament of left knee, subsequent encounter     Problem List There are no problems to display for this patient.   Rosana Hoes, PT, DPT, LAT, ATC 08/14/21  1:41 PM Phone: [phone] Fax: [phone]   [facility] Outpatient Rehabilitation [facility] [address] [place], [place], [address] Phone: [phone]   Fax:  [phone]  Name: Cheryl Cole MRN: [id number] Date of Birth: June 25, 2001

## 2021-08-21 ENCOUNTER — Other Ambulatory Visit: Payer: Self-pay

## 2021-08-21 ENCOUNTER — Encounter: Payer: Self-pay | Admitting: Physical Therapy

## 2021-08-21 ENCOUNTER — Ambulatory Visit: Payer: 59 | Admitting: Physical Therapy

## 2021-08-21 DIAGNOSIS — R2689 Other abnormalities of gait and mobility: Secondary | ICD-10-CM

## 2021-08-21 DIAGNOSIS — G8929 Other chronic pain: Secondary | ICD-10-CM

## 2021-08-21 DIAGNOSIS — M25562 Pain in left knee: Secondary | ICD-10-CM | POA: Diagnosis not present

## 2021-08-21 DIAGNOSIS — S83512D Sprain of anterior cruciate ligament of left knee, subsequent encounter: Secondary | ICD-10-CM

## 2021-08-21 DIAGNOSIS — M25662 Stiffness of left knee, not elsewhere classified: Secondary | ICD-10-CM

## 2021-08-21 NOTE — Therapy (Signed)
[facility] [address] [place], [place], [address] Phone: [phone]   Fax:  [phone]  Physical Therapy Treatment  Patient Details  Name: Cheryl Cole. [place] MRN: [id number] Date of Birth: 2001-09-23 Referring Provider (PT): Bjorn Pippin, MD   Encounter Date: 08/21/2021   PT End of Session - 08/21/21 0829     Visit Number 36    Number of Visits 43    Date for PT Re-Evaluation 10/09/21    Authorization Type [employer] [facility] MCD    Authorization Time Period reauth submitted for 2 visits beginnng 08/21/21    PT Start Time 0825    PT Stop Time 0915    PT Time Calculation (min) 50 min    Activity Tolerance Patient tolerated treatment well    Behavior During Therapy [facility] for tasks assessed/performed             Past Medical History:  Diagnosis Date   Torn meniscus     Past Surgical History:  Procedure Laterality Date   KNEE ARTHROSCOPY WITH ANTERIOR CRUCIATE LIGAMENT (ACL) REPAIR Left 02/05/2021   Procedure: KNEE ARTHROSCOPY WITH ANTERIOR CRUCIATE LIGAMENT (ACL) REPAIR WITH AUTOGRAFT;  Surgeon: Bjorn Pippin, MD;  Location: [facility];  Service: Orthopedics;  Laterality: Left;   KNEE ARTHROSCOPY WITH LATERAL MENISECTOMY  02/05/2021   Procedure: KNEE ARTHROSCOPY WITH LATERAL MENISECTOMY;  Surgeon: Bjorn Pippin, MD;  Location: [facility];  Service: Orthopedics;;   KNEE ARTHROSCOPY WITH MEDIAL MENISECTOMY Left 02/05/2021   Procedure: KNEE ARTHROSCOPY WITH  MEDIAL MENISCUS REPAIR;  Surgeon: Bjorn Pippin, MD;  Location: [facility];  Service: Orthopedics;  Laterality: Left;   NO PAST SURGERIES      There were no vitals filed for this visit.   Subjective Assessment - 08/21/21 0827     Subjective Patient reports she has been doing well with her jumping without any pain. She is jumping over a shoe box now. She has been more consistent with her running. She continues to perform  strengthening exercises.    Patient Stated Goals Get back to previous level of activity    Currently in Pain? No/denies                [facility] PT Assessment - 08/21/21 0001       Strength   Left Knee Flexion 4+/5    Left Knee Extension 4+/5                   [facility] Adult PT Treatment/Exercise:   Therapeutic Exercise:   - Elliptical L5 R5 x 5 min   Plyometrics: - SL 6" box jump x 10 - SL 8" box jump 2 x 5 - patient exhibits difficulty controlled landing - DL forward triple hurdle jump x 6 - DL lateral triple hurdle jump x 5 - Lateral hop on/off BOSO 2 x 5 bilat   - Leg press machine (cybex): single leg - Left: 80# x 8, 90# 3 x 8 - Right: 120# x 8, 130# 2 x 8  - Deadlift barbell 105# 3 x 8 - Forward lunge with red power band pulling forward 3 x 8  - Knee extension machine: single leg  - Left: 15# 3 x 8  - Right: 25# 3 x 8   Manual Therapy: - NA   Neuromuscular re-ed: - NA   Therapeutic Activity: - NA  PT Education - 08/21/21 0828     Education Details HEP    Person(s) Educated Patient    Methods Explanation;Demonstration;Verbal cues    Comprehension Verbalized understanding;Returned demonstration;Tactile cues required;Need further instruction              PT Short Term Goals - 03/26/21 1053       PT SHORT TERM GOAL #1   Title Patient will increase knee flexion to 90 degrees    Baseline 120 - 03/26/21    Time 4    Period Weeks    Status Achieved    Target Date 03/07/21      PT SHORT TERM GOAL #2   Title Patient will demonstrate full extension    Baseline 0 deg knee extension AROM - 03/26/21    Time 4    Period Weeks    Status Achieved    Target Date 03/07/21      PT SHORT TERM GOAL #3   Title Patient will ambualte 300' without a device    Baseline no longer using AD with ambulation - 03/26/21    Time 4    Period Weeks    Status Achieved    Target Date 03/07/21               PT Long Term  Goals - 08/14/21 1337       PT LONG TERM GOAL #1   Title Patient will be indeepdent with exercise program to promote quad strength and single leg stability    Baseline HEP continues to be updated for strengthening and plyometric progression, as well as home running progression    Time 8    Period Weeks    Status On-going    Target Date 10/09/21      PT LONG TERM GOAL #2   Title Patient will demonstrate a 30 second left single leg stance in order to progress to dynamic single leg strength and stability    Baseline Patient able to maintain SLS > 30 sec bilaterally - 03/26/21    Status Achieved      PT LONG TERM GOAL #3   Title Patient will demonstrate 5/5 MMT of left knee in order to normalize running and landing mechanics    Baseline strength grossly 4+/5 MMT left knee, continues to exhibit strength deficit compared to opposite side resulting in deviations with jumping and landing    Time 8    Period Weeks    Status On-going    Target Date 10/09/21      PT LONG TERM GOAL #4   Title Patient will demonstrate >/= 4+/5 MMT hip strength to improve dynamic knee control with landing while running or jumping    Baseline hip strength grossly 4/5 MMT bilaterally    Time 8    Period Weeks    Status On-going    Target Date 10/09/21      PT LONG TERM GOAL #5   Title Patient will be able to perform single leg squat x10 without deviation to demonstrate appropriate motor control    Baseline patient able to perform 10 reps SLS to 22" surface without deviation    Status Achieved      PT LONG TERM GOAL #6   Title Patient will report no increased pain or swelling with PT related activity or exercise in order to progress strength and to dynamic plyometric tasks    Baseline Patient denies any soreness or swelling following PT or activity at this time - 04/28/21  Status Achieved      PT LONG TERM GOAL #7   Title Patient will be able to run at self selected speed and without limitation or increase  in pain in order to return to active lifestyle and exercise    Baseline Patient is progressing well with running progression, she continues to demonstrate deviations with stiff landing on left and is running at slower pace for decreased time    Time 8    Period Weeks    Status On-going    Target Date 10/09/21      PT LONG TERM GOAL #8   Title Patient will report >/= 79/80 on LEFS to indicate return to prior level of functional activity    Baseline LEFS 74/80, continues to have deficit with running/cutting/jumping related tasks    Time 8    Period Weeks    Status On-going    Target Date 10/09/21                   Plan - 08/21/21 0835     Clinical Impression Statement Patient tolerated therapy well with no adverse effects. She continues to demonstrate limitations in left leg strength compared to right and deficits with SL jump landing. She did demonstrate improvement in DL jump form with less weight shift toward right when cued. [employer] using a band pulling patient forward with lunges to place a greater eccentric strain on the quad and simulate deceleration control. Patient would benefit from continued skilled PT to progress her strength and plyometric control in order to return to activities such as running and volleyball without limitation and with reduced risk of reinjury.    PT Treatment/Interventions ADLs/Self Care Home Management;Electrical Stimulation;Ultrasound;Gait training;Stair training;Functional mobility training;Therapeutic activities;Therapeutic exercise;Patient/family education;Manual techniques;Passive range of motion;Scar mobilization;Taping    PT Next Visit Plan Progress HEP, progress strengthening and plyometrics as tolerated, dynamic balance    PT Home Exercise Plan [id number]    Consulted and Agree with Plan of Care Patient             Patient will benefit from skilled therapeutic intervention in order to improve the following deficits and impairments:   Abnormal gait, Difficulty walking, Decreased safety awareness, Decreased activity tolerance, Decreased strength, Decreased mobility, Increased edema, Pain  Visit Diagnosis: Chronic pain of left knee  Stiffness of left knee, not elsewhere classified  Other abnormalities of gait and mobility  Rupture of anterior cruciate ligament of left knee, subsequent encounter     Problem List There are no problems to display for this patient.   Rosana Hoes, PT, DPT, LAT, ATC 08/21/21  9:15 AM Phone: [phone] Fax: [phone]   [facility] [address] [place], [place], [address] Phone: [phone]   Fax:  [phone]  Name: Dicie Edelen. [place] MRN: [id number] Date of Birth: [birth year]

## 2021-08-25 ENCOUNTER — Encounter: Payer: Self-pay | Admitting: Physical Therapy

## 2021-08-25 ENCOUNTER — Ambulatory Visit: Payer: 59 | Admitting: Physical Therapy

## 2021-08-25 ENCOUNTER — Other Ambulatory Visit: Payer: Self-pay

## 2021-08-25 DIAGNOSIS — M25562 Pain in left knee: Secondary | ICD-10-CM | POA: Diagnosis not present

## 2021-08-25 DIAGNOSIS — M25662 Stiffness of left knee, not elsewhere classified: Secondary | ICD-10-CM

## 2021-08-25 DIAGNOSIS — S83512D Sprain of anterior cruciate ligament of left knee, subsequent encounter: Secondary | ICD-10-CM

## 2021-08-25 DIAGNOSIS — R2689 Other abnormalities of gait and mobility: Secondary | ICD-10-CM

## 2021-08-25 DIAGNOSIS — G8929 Other chronic pain: Secondary | ICD-10-CM

## 2021-08-25 NOTE — Therapy (Signed)
[facility] [address] [place], [place], [address] Phone: [phone]   Fax:  [phone]  Physical Therapy Treatment  Patient Details  Name: Cheryl Cole MRN: [id number] Date of Birth: 10-24-2001 Referring Provider (PT): Bjorn Pippin, MD   Encounter Date: 08/25/2021   PT End of Session - 08/25/21 0812     Visit Number 37    Number of Visits 43    Date for PT Re-Evaluation 10/09/21    Authorization Type [employer] [facility] MCD    Authorization Time Period reauth submitted for 2 visits beginnng 08/21/21    PT Start Time 0805    PT Stop Time 0900    PT Time Calculation (min) 55 min             Past Medical History:  Diagnosis Date   Torn meniscus     Past Surgical History:  Procedure Laterality Date   KNEE ARTHROSCOPY WITH ANTERIOR CRUCIATE LIGAMENT (ACL) REPAIR Left 02/05/2021   Procedure: KNEE ARTHROSCOPY WITH ANTERIOR CRUCIATE LIGAMENT (ACL) REPAIR WITH AUTOGRAFT;  Surgeon: Bjorn Pippin, MD;  Location: [facility];  Service: Orthopedics;  Laterality: Left;   KNEE ARTHROSCOPY WITH LATERAL MENISECTOMY  02/05/2021   Procedure: KNEE ARTHROSCOPY WITH LATERAL MENISECTOMY;  Surgeon: Bjorn Pippin, MD;  Location: [facility];  Service: Orthopedics;;   KNEE ARTHROSCOPY WITH MEDIAL MENISECTOMY Left 02/05/2021   Procedure: KNEE ARTHROSCOPY WITH  MEDIAL MENISCUS REPAIR;  Surgeon: Bjorn Pippin, MD;  Location: [facility];  Service: Orthopedics;  Laterality: Left;   NO PAST SURGERIES      There were no vitals filed for this visit.   Subjective Assessment - 08/25/21 0723     Subjective No pain. I have not run for 1.5 weeks. I am still jumping over shoe box.    Currently in Pain? No/denies              [facility] Adult PT Treatment/Exercise:   Therapeutic Exercise:   - Elliptical L5 R5 x 5 min   Plyometrics: - SL 6" box jump x 10 - SL 8" box jump 2 x 5 - patient exhibits  difficulty controlled landing - DL forward triple hurdle jump x 6 - DL lateral triple hurdle jump x 5 - Lateral hop on/off BOSO 2 x 5 bilat   - Leg press machine (cybex): single leg - Left: 80# x 8, 90# 3 x 8 - Right: 120# x 8, 130# 2 x 8  - Deadlift barbell 105# 3 x 8 - Forward lunge with red power band pulling forward 3 x 8  - Knee extension machine: single leg  - Left: 15# 3 x 8  - Right: 25# 3 x 8   Manual Therapy: - NA   Neuromuscular re-ed: - NA   Therapeutic Activity: - NA     PT Short Term Goals - 03/26/21 1053       PT SHORT TERM GOAL #1   Title Patient will increase knee flexion to 90 degrees    Baseline 120 - 03/26/21    Time 4    Period Weeks    Status Achieved    Target Date 03/07/21      PT SHORT TERM GOAL #2   Title Patient will demonstrate full extension    Baseline 0 deg knee extension AROM - 03/26/21    Time 4    Period Weeks    Status Achieved    Target  Date 03/07/21      PT SHORT TERM GOAL #3   Title Patient will ambualte 300' without a device    Baseline no longer using AD with ambulation - 03/26/21    Time 4    Period Weeks    Status Achieved    Target Date 03/07/21               PT Long Term Goals - 08/25/21 0841       PT LONG TERM GOAL #1   Title Patient will be indeepdent with exercise program to promote quad strength and single leg stability    Baseline HEP continues to be updated for strengthening and plyometric progression, as well as home running progression    Time 8    Period Weeks    Status On-going      PT LONG TERM GOAL #2   Title Patient will demonstrate a 30 second left single leg stance in order to progress to dynamic single leg strength and stability    Baseline Patient able to maintain SLS > 30 sec bilaterally - 03/26/21    Time 6    Period Weeks    Status Achieved      PT LONG TERM GOAL #3   Title Patient will demonstrate 5/5 MMT of left knee in order to normalize running and landing mechanics     Baseline strength grossly 4+/5 MMT left knee, continues to exhibit strength deficit compared to opposite side resulting in deviations with jumping and landing    Time 8    Period Weeks    Status On-going      PT LONG TERM GOAL #4   Title Patient will demonstrate >/= 4+/5 MMT hip strength to improve dynamic knee control with landing while running or jumping    Baseline hip strength grossly 4/5 MMT bilaterally    Time 8    Period Weeks    Status On-going      PT LONG TERM GOAL #5   Title Patient will be able to perform single leg squat x10 without deviation to demonstrate appropriate motor control    Baseline patient able to perform 10 reps SLS to 22" surface without deviation    Time 8    Period Weeks    Status Achieved      PT LONG TERM GOAL #6   Title Patient will report no increased pain or swelling with PT related activity or exercise in order to progress strength and to dynamic plyometric tasks    Baseline Patient denies any soreness or swelling following PT or activity at this time - 04/28/21    Time 6    Period Weeks    Status Achieved      PT LONG TERM GOAL #7   Title Patient will be able to run at self selected speed and without limitation or increase in pain in order to return to active lifestyle and exercise    Baseline Patient is progressing well with running progression, she continues to demonstrate deviations with stiff landing on left and is running at slower pace for decreased time    Time 8    Period Weeks    Status On-going      PT LONG TERM GOAL #8   Title Patient will report >/= 79/80 on LEFS to indicate return to prior level of functional activity    Baseline LEFS 74/80, continues to have deficit with running/cutting/jumping related tasks    Time 8    Period  Weeks    Status On-going                   Plan - 08/25/21 0812     Clinical Impression Statement Cheryl Cole reports more compliance with jumping HEP however less compliance with jogging this  week. Continued with SL and DL strengthening and plyometrics. Cues for equal landing with DL hopping  and controlled landing, otherwise therex completed with good form. No increased pain with session today.    PT Treatment/Interventions ADLs/Self Care Home Management;Electrical Stimulation;Ultrasound;Gait training;Stair training;Functional mobility training;Therapeutic activities;Therapeutic exercise;Patient/family education;Manual techniques;Passive range of motion;Scar mobilization;Taping    PT Next Visit Plan Progress HEP, progress strengthening and plyometrics as tolerated, dynamic balance    PT Home Exercise Plan [id number]    Consulted and Agree with Plan of Care Patient             Patient will benefit from skilled therapeutic intervention in order to improve the following deficits and impairments:  Abnormal gait, Difficulty walking, Decreased safety awareness, Decreased activity tolerance, Decreased strength, Decreased mobility, Increased edema, Pain  Visit Diagnosis: Chronic pain of left knee  Stiffness of left knee, not elsewhere classified  Other abnormalities of gait and mobility  Rupture of anterior cruciate ligament of left knee, subsequent encounter     Problem List There are no problems to display for this patient.   Jannette Spanner [place], [place] 08/25/2021, 8:42 AM  [facility] [address] [place], [place], [address] Phone: [phone]   Fax:  [phone]  Name: Cheryl Cole MRN: [id number] Date of Birth: 11/09/2000

## 2021-08-28 ENCOUNTER — Encounter: Payer: PRIVATE HEALTH INSURANCE | Admitting: Physical Therapy

## 2021-09-02 ENCOUNTER — Encounter: Payer: PRIVATE HEALTH INSURANCE | Admitting: Physical Therapy

## 2021-09-03 ENCOUNTER — Ambulatory Visit: Payer: 59 | Attending: Orthopaedic Surgery | Admitting: Physical Therapy

## 2021-09-03 ENCOUNTER — Encounter: Payer: Self-pay | Admitting: Physical Therapy

## 2021-09-03 ENCOUNTER — Other Ambulatory Visit: Payer: Self-pay

## 2021-09-03 DIAGNOSIS — G8929 Other chronic pain: Secondary | ICD-10-CM | POA: Insufficient documentation

## 2021-09-03 DIAGNOSIS — M25562 Pain in left knee: Secondary | ICD-10-CM | POA: Insufficient documentation

## 2021-09-03 DIAGNOSIS — M25662 Stiffness of left knee, not elsewhere classified: Secondary | ICD-10-CM | POA: Insufficient documentation

## 2021-09-03 DIAGNOSIS — S83512D Sprain of anterior cruciate ligament of left knee, subsequent encounter: Secondary | ICD-10-CM | POA: Diagnosis present

## 2021-09-03 DIAGNOSIS — R2689 Other abnormalities of gait and mobility: Secondary | ICD-10-CM | POA: Diagnosis present

## 2021-09-03 NOTE — Therapy (Signed)
[facility] [address] [place], [place], [address] Phone: [phone]   Fax:  [phone]  Physical Therapy Treatment  Patient Details  Name: Cheryl Cole MRN: [id number] Date of Birth: [birth year] Referring Provider (PT): Bjorn Pippin, MD   Encounter Date: 09/03/2021   PT End of Session - 09/03/21 0828     Visit Number 38    Number of Visits 43    Date for PT Re-Evaluation 10/09/21    Authorization Type [employer] [facility] MCD    Authorization Time Period 08/21/2021 - 10/24/2021    Authorization - Visit Number 2    Authorization - Number of Visits 8    PT Start Time 0826    PT Stop Time 0915    PT Time Calculation (min) 49 min    Activity Tolerance Patient tolerated treatment well    Behavior During Therapy [facility] for tasks assessed/performed             Past Medical History:  Diagnosis Date   Torn meniscus     Past Surgical History:  Procedure Laterality Date   KNEE ARTHROSCOPY WITH ANTERIOR CRUCIATE LIGAMENT (ACL) REPAIR Left 02/05/2021   Procedure: KNEE ARTHROSCOPY WITH ANTERIOR CRUCIATE LIGAMENT (ACL) REPAIR WITH AUTOGRAFT;  Surgeon: Bjorn Pippin, MD;  Location: [facility];  Service: Orthopedics;  Laterality: Left;   KNEE ARTHROSCOPY WITH LATERAL MENISECTOMY  02/05/2021   Procedure: KNEE ARTHROSCOPY WITH LATERAL MENISECTOMY;  Surgeon: Bjorn Pippin, MD;  Location: [facility];  Service: Orthopedics;;   KNEE ARTHROSCOPY WITH MEDIAL MENISECTOMY Left 02/05/2021   Procedure: KNEE ARTHROSCOPY WITH  MEDIAL MENISCUS REPAIR;  Surgeon: Bjorn Pippin, MD;  Location: [facility];  Service: Orthopedics;  Laterality: Left;   NO PAST SURGERIES      There were no vitals filed for this visit.   Subjective Assessment - 09/03/21 0827     Subjective Patient reports she has been doing her running and on the treadmill using the incline to work on endurance. She is also still consistent  with her strengthening and states jumping is going good.    Patient Stated Goals Get back to previous level of activity    Currently in Pain? No/denies                [facility] PT Assessment - 09/03/21 0001       Strength   Left Knee Flexion 4+/5    Left Knee Extension 4+/5                     [facility] Adult PT Treatment/Exercise:  Therapeutic Exercise: Dynamic warm-up: 50% jog, side shuffle, high knees, butt kicks, walking lunges, lateral lunges, DL broad  jump, alternating SL jump, karaoke Plyometrics:  Lateral skater jumps 2 x 5 SL broad jumps 2 x 8 SL forward hop for speed x 2 lengths Leg press (cybex): single leg Left: 80# x 8, 90#  Right 130# 3 x 8 Blood flow restriction Position and location of cuff: Standing and proximal thigh Limb occlusion pressure (mmHg): 190 Exercise pressure (mmHg): 152 Exercise prescription: 30,15,15,15, reps with 30-60 sec rest Exercise comment: [place] split squat - partial range (knee to toe)  Manual Therapy: N/A  Neuromuscular re-ed: N/A  Therapeutic Activity: N/A  Modalities: N/A  Self Care: N/A                PT Education - 09/03/21 0828     Education  Details HEP    Person(s) Educated Patient    Methods Explanation;Demonstration;Verbal cues    Comprehension Verbalized understanding;Returned demonstration;Verbal cues required;Need further instruction              PT Short Term Goals - 03/26/21 1053       PT SHORT TERM GOAL #1   Title Patient will increase knee flexion to 90 degrees    Baseline 120 - 03/26/21    Time 4    Period Weeks    Status Achieved    Target Date 03/07/21      PT SHORT TERM GOAL #2   Title Patient will demonstrate full extension    Baseline 0 deg knee extension AROM - 03/26/21    Time 4    Period Weeks    Status Achieved    Target Date 03/07/21      PT SHORT TERM GOAL #3   Title Patient will ambualte 300' without a device    Baseline no longer using AD with  ambulation - 03/26/21    Time 4    Period Weeks    Status Achieved    Target Date 03/07/21               PT Long Term Goals - 08/25/21 0841       PT LONG TERM GOAL #1   Title Patient will be indeepdent with exercise program to promote quad strength and single leg stability    Baseline HEP continues to be updated for strengthening and plyometric progression, as well as home running progression    Time 8    Period Weeks    Status On-going      PT LONG TERM GOAL #2   Title Patient will demonstrate a 30 second left single leg stance in order to progress to dynamic single leg strength and stability    Baseline Patient able to maintain SLS > 30 sec bilaterally - 03/26/21    Time 6    Period Weeks    Status Achieved      PT LONG TERM GOAL #3   Title Patient will demonstrate 5/5 MMT of left knee in order to normalize running and landing mechanics    Baseline strength grossly 4+/5 MMT left knee, continues to exhibit strength deficit compared to opposite side resulting in deviations with jumping and landing    Time 8    Period Weeks    Status On-going      PT LONG TERM GOAL #4   Title Patient will demonstrate >/= 4+/5 MMT hip strength to improve dynamic knee control with landing while running or jumping    Baseline hip strength grossly 4/5 MMT bilaterally    Time 8    Period Weeks    Status On-going      PT LONG TERM GOAL #5   Title Patient will be able to perform single leg squat x10 without deviation to demonstrate appropriate motor control    Baseline patient able to perform 10 reps SLS to 22" surface without deviation    Time 8    Period Weeks    Status Achieved      PT LONG TERM GOAL #6   Title Patient will report no increased pain or swelling with PT related activity or exercise in order to progress strength and to dynamic plyometric tasks    Baseline Patient denies any soreness or swelling following PT or activity at this time - 04/28/21    Time 6  Period Weeks     Status Achieved      PT LONG TERM GOAL #7   Title Patient will be able to run at self selected speed and without limitation or increase in pain in order to return to active lifestyle and exercise    Baseline Patient is progressing well with running progression, she continues to demonstrate deviations with stiff landing on left and is running at slower pace for decreased time    Time 8    Period Weeks    Status On-going      PT LONG TERM GOAL #8   Title Patient will report >/= 79/80 on LEFS to indicate return to prior level of functional activity    Baseline LEFS 74/80, continues to have deficit with running/cutting/jumping related tasks    Time 8    Period Weeks    Status On-going                   Plan - 09/03/21 0830     Clinical Impression Statement Patient tolerated therapy well with no adverse effects. She demonstrated increased fatigue with plyometrics this visit and reported fealing generally weak with strengthening exercises so utilized BFR this visit to promote strengthening using light load. She continues to demonstrate strength deficit on the left with impaired landing mechanics. She was encouraged to continue prioritizing strength for home and continue her plyometric/running routine. Patient would benefit from continued skilled PT to progress her strength and plyometric control in order to return to activities such as running and volleyball without limitation and with reduced risk of reinjury.    PT Treatment/Interventions ADLs/Self Care Home Management;Electrical Stimulation;Ultrasound;Gait training;Stair training;Functional mobility training;Therapeutic activities;Therapeutic exercise;Patient/family education;Manual techniques;Passive range of motion;Scar mobilization;Taping    PT Next Visit Plan Progress HEP, progress strengthening and plyometrics as tolerated, dynamic balance    PT Home Exercise Plan [id number]    Consulted and Agree with Plan of Care Patient              Patient will benefit from skilled therapeutic intervention in order to improve the following deficits and impairments:  Abnormal gait, Difficulty walking, Decreased safety awareness, Decreased activity tolerance, Decreased strength, Decreased mobility, Increased edema, Pain  Visit Diagnosis: Chronic pain of left knee  Stiffness of left knee, not elsewhere classified  Other abnormalities of gait and mobility  Rupture of anterior cruciate ligament of left knee, subsequent encounter     Problem List There are no problems to display for this patient.   Rosana Hoes, PT, DPT, LAT, ATC 09/03/21  9:15 AM Phone: [phone] Fax: [phone]   [facility] Outpatient Rehabilitation [facility] [address] [place], [place], [address] Phone: [phone]   Fax:  [phone]  Name: Cheryl Cole MRN: [id number] Date of Birth: 02-Dec-2000

## 2021-09-08 ENCOUNTER — Ambulatory Visit: Payer: 59 | Admitting: Physical Therapy

## 2021-09-08 ENCOUNTER — Other Ambulatory Visit: Payer: Self-pay

## 2021-09-08 ENCOUNTER — Encounter: Payer: Self-pay | Admitting: Physical Therapy

## 2021-09-08 DIAGNOSIS — M25662 Stiffness of left knee, not elsewhere classified: Secondary | ICD-10-CM

## 2021-09-08 DIAGNOSIS — S83512D Sprain of anterior cruciate ligament of left knee, subsequent encounter: Secondary | ICD-10-CM

## 2021-09-08 DIAGNOSIS — M25562 Pain in left knee: Secondary | ICD-10-CM

## 2021-09-08 DIAGNOSIS — G8929 Other chronic pain: Secondary | ICD-10-CM

## 2021-09-08 DIAGNOSIS — R2689 Other abnormalities of gait and mobility: Secondary | ICD-10-CM

## 2021-09-08 NOTE — Therapy (Signed)
[facility] [address] [place], [place], [address] Phone: [phone]   Fax:  [phone]  Physical Therapy Treatment  Patient Details  Name: Cheryl Cole. Kassa MRN: [id number] Date of Birth: 10/01/2001 Referring Provider (PT): Bjorn Pippin, MD   Encounter Date: 09/08/2021   PT End of Session - 09/08/21 0815     Visit Number 39    Number of Visits 43    Date for PT Re-Evaluation 10/09/21    Authorization Type [employer] [facility] MCD    Authorization Time Period 08/21/2021 - 10/24/2021    Authorization - Visit Number 3    Authorization - Number of Visits 8    PT Start Time 0810    PT Stop Time 0900    PT Time Calculation (min) 50 min    Activity Tolerance Patient tolerated treatment well    Behavior During Therapy [facility] for tasks assessed/performed             Past Medical History:  Diagnosis Date   Torn meniscus     Past Surgical History:  Procedure Laterality Date   KNEE ARTHROSCOPY WITH ANTERIOR CRUCIATE LIGAMENT (ACL) REPAIR Left 02/05/2021   Procedure: KNEE ARTHROSCOPY WITH ANTERIOR CRUCIATE LIGAMENT (ACL) REPAIR WITH AUTOGRAFT;  Surgeon: Bjorn Pippin, MD;  Location: [facility];  Service: Orthopedics;  Laterality: Left;   KNEE ARTHROSCOPY WITH LATERAL MENISECTOMY  02/05/2021   Procedure: KNEE ARTHROSCOPY WITH LATERAL MENISECTOMY;  Surgeon: Bjorn Pippin, MD;  Location: [facility];  Service: Orthopedics;;   KNEE ARTHROSCOPY WITH MEDIAL MENISECTOMY Left 02/05/2021   Procedure: KNEE ARTHROSCOPY WITH  MEDIAL MENISCUS REPAIR;  Surgeon: Bjorn Pippin, MD;  [facility];  Service: Orthopedics;  Laterality: Left;   NO PAST SURGERIES      There were no vitals filed for this visit.   Subjective Assessment - 09/08/21 0812     Subjective Patient reports she has been doing well. She reports her knee was feeling a little tight so she did some stretching which helped. She has  been doing a lot of strengthening at home.    Patient Stated Goals Get back to previous level of activity    Currently in Pain? No/denies                [facility] PT Assessment - 09/08/21 0001       Strength   Left Knee Flexion 4+/5    Left Knee Extension 4+/5                    [facility] Adult PT Treatment/Exercise:  Therapeutic Exercise: [place] squat 30# with heel prop 3 x 8 Lateral band walk with black at ankles 3 x 20  Barbell deadlift 105# 3 x 8 Bulgarian split squat 25# 3 x 6 each  Step-up 12" holding 5# bilat 3 x 10 each Lateral lunge with 10# 3 x 8 each  Kettlebell swing 25# 3 x 12 SLS on Airex with alternating cone tap 3 x 10 each  Manual Therapy: N/A  Neuromuscular re-ed: N/A  Therapeutic Activity: N/A  Modalities: N/A  Self Care: N/A                 PT Education - 09/08/21 0814     Education Details HEP    Person(s) Educated Patient    Methods Explanation;Demonstration;Verbal cues    Comprehension Verbalized understanding;Returned demonstration;Tactile cues required  PT Short Term Goals - 03/26/21 1053       PT SHORT TERM GOAL #1   Title Patient will increase knee flexion to 90 degrees    Baseline 120 - 03/26/21    Time 4    Period Weeks    Status Achieved    Target Date 03/07/21      PT SHORT TERM GOAL #2   Title Patient will demonstrate full extension    Baseline 0 deg knee extension AROM - 03/26/21    Time 4    Period Weeks    Status Achieved    Target Date 03/07/21      PT SHORT TERM GOAL #3   Title Patient will ambualte 300' without a device    Baseline no longer using AD with ambulation - 03/26/21    Time 4    Period Weeks    Status Achieved    Target Date 03/07/21               PT Long Term Goals - 08/25/21 0841       PT LONG TERM GOAL #1   Title Patient will be indeepdent with exercise program to promote quad strength and single leg stability    Baseline HEP continues to  be updated for strengthening and plyometric progression, as well as home running progression    Time 8    Period Weeks    Status On-going      PT LONG TERM GOAL #2   Title Patient will demonstrate a 30 second left single leg stance in order to progress to dynamic single leg strength and stability    Baseline Patient able to maintain SLS > 30 sec bilaterally - 03/26/21    Time 6    Period Weeks    Status Achieved      PT LONG TERM GOAL #3   Title Patient will demonstrate 5/5 MMT of left knee in order to normalize running and landing mechanics    Baseline strength grossly 4+/5 MMT left knee, continues to exhibit strength deficit compared to opposite side resulting in deviations with jumping and landing    Time 8    Period Weeks    Status On-going      PT LONG TERM GOAL #4   Title Patient will demonstrate >/= 4+/5 MMT hip strength to improve dynamic knee control with landing while running or jumping    Baseline hip strength grossly 4/5 MMT bilaterally    Time 8    Period Weeks    Status On-going      PT LONG TERM GOAL #5   Title Patient will be able to perform single leg squat x10 without deviation to demonstrate appropriate motor control    Baseline patient able to perform 10 reps SLS to 22" surface without deviation    Time 8    Period Weeks    Status Achieved      PT LONG TERM GOAL #6   Title Patient will report no increased pain or swelling with PT related activity or exercise in order to progress strength and to dynamic plyometric tasks    Baseline Patient denies any soreness or swelling following PT or activity at this time - 04/28/21    Time 6    Period Weeks    Status Achieved      PT LONG TERM GOAL #7   Title Patient will be able to run at self selected speed and without limitation or increase in pain in  order to return to active lifestyle and exercise    Baseline Patient is progressing well with running progression, she continues to demonstrate deviations with stiff  landing on left and is running at slower pace for decreased time    Time 8    Period Weeks    Status On-going      PT LONG TERM GOAL #8   Title Patient will report >/= 79/80 on LEFS to indicate return to prior level of functional activity    Baseline LEFS 74/80, continues to have deficit with running/cutting/jumping related tasks    Time 8    Period Weeks    Status On-going                   Plan - 09/08/21 0815     Clinical Impression Statement Patient tolerated therapy well with no adverse effects. Therapy focused primarily on progressing strength and control. She was able to tolerate increse in weight/resistance with exercises but does continue to demonstrate gross strength deficit on the left. She does require cueing for proper depth and pacing of exercise. No pain reported with exercises. No changes made to HEP, patient encouraged to continue progressing resistance/weight at home. Patient would benefit from continued skilled PT to progress her strength and plyometric control in order to return to activities such as running and volleyball without limitation and with reduced risk of reinjury.    PT Treatment/Interventions ADLs/Self Care Home Management;Electrical Stimulation;Ultrasound;Gait training;Stair training;Functional mobility training;Therapeutic activities;Therapeutic exercise;Patient/family education;Manual techniques;Passive range of motion;Scar mobilization;Taping    PT Next Visit Plan Progress HEP, progress strengthening and plyometrics as tolerated, dynamic balance    PT Home Exercise Plan [id number]    Consulted and Agree with Plan of Care Patient             Patient will benefit from skilled therapeutic intervention in order to improve the following deficits and impairments:  Abnormal gait, Difficulty walking, Decreased safety awareness, Decreased activity tolerance, Decreased strength, Decreased mobility, Increased edema, Pain  Visit Diagnosis: Chronic pain  of left knee  Stiffness of left knee, not elsewhere classified  Other abnormalities of gait and mobility  Rupture of anterior cruciate ligament of left knee, subsequent encounter     Problem List There are no problems to display for this patient.   Rosana Hoes, PT, DPT, LAT, ATC 09/08/21  9:01 AM Phone: [phone] Fax: [phone]   [facility] Outpatient Rehabilitation [facility] [address] [place], [place], [address] Phone: [phone]   Fax:  [phone]  Name: Odell Fasching. Yzaguirre MRN: [id number] Date of Birth: [birth year]

## 2021-09-09 ENCOUNTER — Encounter: Payer: PRIVATE HEALTH INSURANCE | Admitting: Physical Therapy

## 2021-09-15 ENCOUNTER — Encounter: Payer: Self-pay | Admitting: Physical Therapy

## 2021-09-15 ENCOUNTER — Ambulatory Visit: Payer: 59 | Admitting: Physical Therapy

## 2021-09-15 ENCOUNTER — Other Ambulatory Visit: Payer: Self-pay

## 2021-09-15 DIAGNOSIS — M25562 Pain in left knee: Secondary | ICD-10-CM | POA: Diagnosis not present

## 2021-09-15 DIAGNOSIS — G8929 Other chronic pain: Secondary | ICD-10-CM

## 2021-09-15 DIAGNOSIS — R2689 Other abnormalities of gait and mobility: Secondary | ICD-10-CM

## 2021-09-15 DIAGNOSIS — M25662 Stiffness of left knee, not elsewhere classified: Secondary | ICD-10-CM

## 2021-09-15 DIAGNOSIS — S83512D Sprain of anterior cruciate ligament of left knee, subsequent encounter: Secondary | ICD-10-CM

## 2021-09-15 NOTE — Therapy (Signed)
[facility] Outpatient Rehabilitation [facility] [address] [place], [place], [address] Phone: [phone]   Fax:  [phone]  Physical Therapy Treatment  Patient Details  Name: Cheryl Cole. Lackie MRN: [id number] Date of Birth: 30-Oct-2001 Referring Provider (PT): Bjorn Pippin, MD   Encounter Date: 09/15/2021   PT End of Session - 09/15/21 0830     Visit Number 40    Number of Visits 43    Date for PT Re-Evaluation 10/09/21    Authorization Type [employer] / [facility] MCD    Authorization Time Period 08/21/2021 - 10/24/2021    Authorization - Visit Number 4    Authorization - Number of Visits 8    PT Start Time 0825    PT Stop Time 0915    PT Time Calculation (min) 50 min    Activity Tolerance Patient tolerated treatment well    Behavior During Therapy [facility] for tasks assessed/performed             Past Medical History:  Diagnosis Date   Torn meniscus     Past Surgical History:  Procedure Laterality Date   KNEE ARTHROSCOPY WITH ANTERIOR CRUCIATE LIGAMENT (ACL) REPAIR Left 02/05/2021   Procedure: KNEE ARTHROSCOPY WITH ANTERIOR CRUCIATE LIGAMENT (ACL) REPAIR WITH AUTOGRAFT;  Surgeon: Bjorn Pippin, MD;  Location: [facility];  Service: Orthopedics;  Laterality: Left;   KNEE ARTHROSCOPY WITH LATERAL MENISECTOMY  02/05/2021   Procedure: KNEE ARTHROSCOPY WITH LATERAL MENISECTOMY;  Surgeon: Bjorn Pippin, MD;  Location: [facility];  Service: Orthopedics;;   KNEE ARTHROSCOPY WITH MEDIAL MENISECTOMY Left 02/05/2021   Procedure: KNEE ARTHROSCOPY WITH  MEDIAL MENISCUS REPAIR;  Surgeon: Bjorn Pippin, MD;  Location: [facility];  Service: Orthopedics;  Laterality: Left;   NO PAST SURGERIES      There were no vitals filed for this visit.   Subjective Assessment - 09/15/21 0827     Subjective Patient reports she is having a little bit of pain on top and the side of her knee. States she was doing lunges and felt shaky, and then  she was doing step ups and her knee felt weak. She denies any pain with typical daily activities or jogging, but states when she starts doing heavier strengthening the knee will feel weaker and fatigue quickly.    Patient Stated Goals Get back to previous level of activity    Currently in Pain? No/denies                [facility] PT Assessment - 09/15/21 0001       Strength   Left Knee Flexion 4+/5    Left Knee Extension 4+/5                    [facility] Adult PT Treatment/Exercise:  Therapeutic Exercise: Elliptical L5 x 5 min while taking subjective Dynamic warm-up: jog, high knees, butt kicks, side shuffle, karaoke Leg press: (cybex) DL: 124# x 10 SL: Left - 58# x 8, 80# 2 x 6, eccentric 100# 2 x 6; Right - 120# 4 x 8 Barbell deadlift 105# 3 x 8 Side plank clamshell with black 3 x 10 each SL bridge hamstring curl on physioball 4 x 6 SLS on Airex with pallof press red power band 3 x 10 Spanish squat with 10# 2 x 6 - used barbell at squat rack  Manual Therapy: N/A  Neuromuscular re-ed: N/A  Therapeutic Activity: N/A  Modalities: N/A  Self Care: N/A  PT Education - 09/15/21 0829     Education Details HEP, patient instructed as she increases weights she should give herself longer rest periods between sets to reduce onset of fatigue    Person(s) Educated Patient    Methods Explanation;Demonstration;Verbal cues    Comprehension Verbalized understanding;Returned demonstration;Verbal cues required;Need further instruction              PT Short Term Goals - 03/26/21 1053       PT SHORT TERM GOAL #1   Title Patient will increase knee flexion to 90 degrees    Baseline 120 - 03/26/21    Time 4    Period Weeks    Status Achieved    Target Date 03/07/21      PT SHORT TERM GOAL #2   Title Patient will demonstrate full extension    Baseline 0 deg knee extension AROM - 03/26/21    Time 4    Period Weeks    Status Achieved     Target Date 03/07/21      PT SHORT TERM GOAL #3   Title Patient will ambualte 300' without a device    Baseline no longer using AD with ambulation - 03/26/21    Time 4    Period Weeks    Status Achieved    Target Date 03/07/21               PT Long Term Goals - 08/25/21 0841       PT LONG TERM GOAL #1   Title Patient will be indeepdent with exercise program to promote quad strength and single leg stability    Baseline HEP continues to be updated for strengthening and plyometric progression, as well as home running progression    Time 8    Period Weeks    Status On-going      PT LONG TERM GOAL #2   Title Patient will demonstrate a 30 second left single leg stance in order to progress to dynamic single leg strength and stability    Baseline Patient able to maintain SLS > 30 sec bilaterally - 03/26/21    Time 6    Period Weeks    Status Achieved      PT LONG TERM GOAL #3   Title Patient will demonstrate 5/5 MMT of left knee in order to normalize running and landing mechanics    Baseline strength grossly 4+/5 MMT left knee, continues to exhibit strength deficit compared to opposite side resulting in deviations with jumping and landing    Time 8    Period Weeks    Status On-going      PT LONG TERM GOAL #4   Title Patient will demonstrate >/= 4+/5 MMT hip strength to improve dynamic knee control with landing while running or jumping    Baseline hip strength grossly 4/5 MMT bilaterally    Time 8    Period Weeks    Status On-going      PT LONG TERM GOAL #5   Title Patient will be able to perform single leg squat x10 without deviation to demonstrate appropriate motor control    Baseline patient able to perform 10 reps SLS to 22" surface without deviation    Time 8    Period Weeks    Status Achieved      PT LONG TERM GOAL #6   Title Patient will report no increased pain or swelling with PT related activity or exercise in order to progress strength and to  dynamic plyometric  tasks    Baseline Patient denies any soreness or swelling following PT or activity at this time - 04/28/21    Time 6    Period Weeks    Status Achieved      PT LONG TERM GOAL #7   Title Patient will be able to run at self selected speed and without limitation or increase in pain in order to return to active lifestyle and exercise    Baseline Patient is progressing well with running progression, she continues to demonstrate deviations with stiff landing on left and is running at slower pace for decreased time    Time 8    Period Weeks    Status On-going      PT LONG TERM GOAL #8   Title Patient will report >/= 79/80 on LEFS to indicate return to prior level of functional activity    Baseline LEFS 74/80, continues to have deficit with running/cutting/jumping related tasks    Time 8    Period Weeks    Status On-going                   Plan - 09/15/21 0831     Clinical Impression Statement Patient tolerated therapy well with no adverse effects. She did not report any increase in knee pain this visit but does demonstrate shakiness and control deficit with increase in weight. Patient allowed longer rest breaks this visit between sets which helped to reduce onset of fatigue. Therapy focuses primarily on progressing strength and dynamic balance. Did not perform any plyometric exercise this visit. Patient would benefit from continued skilled PT to progress her strength and plyometric control in order to return to activities such as running and volleyball without limitation and with reduced risk of reinjury.    PT Treatment/Interventions ADLs/Self Care Home Management;Electrical Stimulation;Ultrasound;Gait training;Stair training;Functional mobility training;Therapeutic activities;Therapeutic exercise;Patient/family education;Manual techniques;Passive range of motion;Scar mobilization;Taping    PT Next Visit Plan Progress HEP, progress strengthening and plyometrics as tolerated, dynamic  balance    PT Home Exercise Plan [id number]    Consulted and Agree with Plan of Care Patient             Patient will benefit from skilled therapeutic intervention in order to improve the following deficits and impairments:  Abnormal gait, Difficulty walking, Decreased safety awareness, Decreased activity tolerance, Decreased strength, Decreased mobility, Increased edema, Pain  Visit Diagnosis: Chronic pain of left knee  Stiffness of left knee, not elsewhere classified  Other abnormalities of gait and mobility  Rupture of anterior cruciate ligament of left knee, subsequent encounter     Problem List There are no problems to display for this patient.   Rosana Hoes, PT, DPT, LAT, ATC 09/15/21  9:15 AM Phone: [phone] Fax: [phone]   [facility] Outpatient Rehabilitation [facility] [address] [place], [place], [address] Phone: [phone]   Fax:  [phone]  Name: Tayja Manzer. Schwarz MRN: [id number] Date of Birth: 08/06/2001

## 2021-09-16 ENCOUNTER — Encounter: Payer: PRIVATE HEALTH INSURANCE | Admitting: Physical Therapy

## 2021-09-22 ENCOUNTER — Encounter: Payer: Self-pay | Admitting: Physical Therapy

## 2021-09-22 ENCOUNTER — Other Ambulatory Visit: Payer: Self-pay

## 2021-09-22 ENCOUNTER — Ambulatory Visit: Payer: 59 | Admitting: Physical Therapy

## 2021-09-22 DIAGNOSIS — M25562 Pain in left knee: Secondary | ICD-10-CM

## 2021-09-22 DIAGNOSIS — S83512D Sprain of anterior cruciate ligament of left knee, subsequent encounter: Secondary | ICD-10-CM

## 2021-09-22 DIAGNOSIS — M25662 Stiffness of left knee, not elsewhere classified: Secondary | ICD-10-CM

## 2021-09-22 DIAGNOSIS — G8929 Other chronic pain: Secondary | ICD-10-CM

## 2021-09-22 DIAGNOSIS — R2689 Other abnormalities of gait and mobility: Secondary | ICD-10-CM

## 2021-09-22 NOTE — Therapy (Signed)
[facility] Outpatient Rehabilitation [facility] [address] [place], [place], [address] Phone: [phone]   Fax:  [phone]  Physical Therapy Treatment  Patient Details  Name: Cheryl Cole. Schnitzer MRN: [id number] Date of Birth: 2001-03-26 Referring Provider (PT): Bjorn Pippin, MD   Encounter Date: 09/22/2021   PT End of Session - 09/22/21 0829     Visit Number 41    Number of Visits 43    Date for PT Re-Evaluation 10/09/21    Authorization Type [employer] / [facility] MCD    Authorization Time Period 08/21/2021 - 10/24/2021    Authorization - Visit Number 5    Authorization - Number of Visits 8    PT Start Time 0825    PT Stop Time 0915   5 min TPDN performed   PT Time Calculation (min) 50 min    Activity Tolerance Patient tolerated treatment well    Behavior During Therapy [facility] for tasks assessed/performed             Past Medical History:  Diagnosis Date   Torn meniscus     Past Surgical History:  Procedure Laterality Date   KNEE ARTHROSCOPY WITH ANTERIOR CRUCIATE LIGAMENT (ACL) REPAIR Left 02/05/2021   Procedure: KNEE ARTHROSCOPY WITH ANTERIOR CRUCIATE LIGAMENT (ACL) REPAIR WITH AUTOGRAFT;  Surgeon: Bjorn Pippin, MD;  Location: [facility];  Service: Orthopedics;  Laterality: Left;   KNEE ARTHROSCOPY WITH LATERAL MENISECTOMY  02/05/2021   Procedure: KNEE ARTHROSCOPY WITH LATERAL MENISECTOMY;  Surgeon: Bjorn Pippin, MD;  Location: [facility];  Service: Orthopedics;;   KNEE ARTHROSCOPY WITH MEDIAL MENISECTOMY Left 02/05/2021   Procedure: KNEE ARTHROSCOPY WITH  MEDIAL MENISCUS REPAIR;  Surgeon: Bjorn Pippin, MD;  Location: [facility];  Service: Orthopedics;  Laterality: Left;   NO PAST SURGERIES      There were no vitals filed for this visit.   Subjective Assessment - 09/22/21 0827     Subjective Patient reports she has lightened the weight a little bit and adding time between sets which has helped. She did  try to do some heavier weight and felt the weakness. She denies any pain with general activity. She has continued with her running but hasn't been doing a lot jumping.    Patient Stated Goals Get back to previous level of activity    Currently in Pain? No/denies                [facility] PT Assessment - 09/22/21 0001       Strength   Left Hip Extension 4/5    Left Hip ABduction 4/5    Left Knee Flexion 4+/5    Left Knee Extension 4+/5                   [facility] Adult PT Treatment/Exercise:   Therapeutic Exercise: Elliptical L5 R5 x 5 min while taking subjective Dynamic warm-up: jog at 50%, 70%, 90%, high knees, butt kicks, side shuffle, karaoke, high knee skipping, DL broad jump Supine quad set with towel roll under 10 x 5 sec hold Supine quad set with towel roll under knee 10 x 5 sec hold - patient able to pop heel off table SLR 2 x 10 - patient able to perform without lag Forward heel tap on 6" box 2 x 10 each - patient demonstrates difficulty on left Eccentric hamstring curl with physioball 3 x 8 LAQ with 6# 3 x 10 Spring board alternating leg extension with  [place] 3 x 8 Prone quad stretch with strap 3 x 30 sec - towel roll placed under knee for hip extension   Manual Therapy: N/A   Neuromuscular re-ed: N/A   Therapeutic Activity: N/A   Modalities: N/A   Self Care: N/A  Trigger Point Dry Needling Treatment: Pre-treatment instruction: Patient instructed on dry needling rationale, procedures, and possible side effects including pain during treatment (achy,cramping feeling), bruising, drop of blood, lightheadedness, nausea, sweating. Patient Consent Given: Yes Education handout provided: No Muscles treated: left VMO and VL  Needle size and number: .30x54mm x 2 Electrical stimulation performed: No Parameters: N/A Treatment response/outcome: Twitch response elicited, Palpable decrease in muscle tension, and patient reporting improved  symptoms Post-treatment instructions: Patient instructed to expect possible mild to moderate muscle soreness later today and/or tomorrow. Patient instructed in methods to reduce muscle soreness and to continue prescribed HEP. If patient was dry needled over the lung field, patient was instructed on signs and symptoms of pneumothorax and, however unlikely, to see immediate medical attention should they occur. Patient was also educated on signs and symptoms of infection and to seek medical attention should they occur. Patient verbalized understanding of these instructions and education.                PT Education - 09/22/21 0828     Education Details HEP, dry needling    Person(s) Educated Patient    Methods Explanation;Demonstration;Verbal cues    Comprehension Verbalized understanding;Returned demonstration;Verbal cues required;Need further instruction              PT Short Term Goals - 03/26/21 1053       PT SHORT TERM GOAL #1   Title Patient will increase knee flexion to 90 degrees    Baseline 120 - 03/26/21    Time 4    Period Weeks    Status Achieved    Target Date 03/07/21      PT SHORT TERM GOAL #2   Title Patient will demonstrate full extension    Baseline 0 deg knee extension AROM - 03/26/21    Time 4    Period Weeks    Status Achieved    Target Date 03/07/21      PT SHORT TERM GOAL #3   Title Patient will ambualte 300' without a device    Baseline no longer using AD with ambulation - 03/26/21    Time 4    Period Weeks    Status Achieved    Target Date 03/07/21               PT Long Term Goals - 09/22/21 0932       PT LONG TERM GOAL #1   Title Patient will be indeepdent with exercise program to promote quad strength and single leg stability    Baseline HEP continues to be updated for strengthening and plyometric progression, as well as home running progression    Time 8    Period Weeks    Status On-going    Target Date 10/09/21      PT  LONG TERM GOAL #2   Title Patient will demonstrate a 30 second left single leg stance in order to progress to dynamic single leg strength and stability    Baseline Patient able to maintain SLS > 30 sec bilaterally - 03/26/21    Time 6    Period Weeks    Status Achieved      PT LONG TERM GOAL #3  Title Patient will demonstrate 5/5 MMT of left knee in order to normalize running and landing mechanics    Baseline strength grossly 4+/5 MMT left knee, continues to exhibit strength deficit compared to opposite side    Time 8    Period Weeks    Status On-going    Target Date 10/09/21      PT LONG TERM GOAL #4   Title Patient will demonstrate >/= 4+/5 MMT hip strength to improve dynamic knee control with landing while running or jumping    Baseline hip strength grossly 4/5 MMT bilaterally    Time 8    Period Weeks    Status On-going    Target Date 10/09/21      PT LONG TERM GOAL #5   Title Patient will be able to perform single leg squat x10 without deviation to demonstrate appropriate motor control    Baseline patient able to perform 10 reps SLS to 22" surface without deviation    Time 8    Period Weeks    Status Achieved      PT LONG TERM GOAL #6   Title Patient will report no increased pain or swelling with PT related activity or exercise in order to progress strength and to dynamic plyometric tasks    Baseline Patient denies any soreness or swelling following PT or activity at this time - 04/28/21    Time 6    Period Weeks    Status Achieved      PT LONG TERM GOAL #7   Title Patient will be able to run at self selected speed and without limitation or increase in pain in order to return to active lifestyle and exercise    Baseline Patient is progressing well with running progression, she continues to demonstrate deviations with stiff landing on left and is running at slower pace for decreased time    Time 8    Period Weeks    Status On-going    Target Date 10/09/21      PT LONG  TERM GOAL #8   Title Patient will report >/= 79/80 on LEFS to indicate return to prior level of functional activity    Baseline LEFS 74/80, continues to have deficit with running/cutting/jumping related tasks    Time 8    Period Weeks    Status On-going    Target Date 10/09/21                   Plan - 09/22/21 0829     Clinical Impression Statement Patient tolerated therapy well with no adverse effects. Therapy regressed this visit due to patrient reporting mild knee pain with plyometric activities. She does have good quad activation and able to demonstrate proper active hyperextension and SLRs without any lag. She does continue to demonstrate weakness and fatigue of left quad with single leg strengthening. Utilized TPDN this visit for trigger points located in VMO and VL that could be contributing to symptoms, with patient reporting improvement post treatment. Patient would benefit from continued skilled PT to progress her strength and plyometric control in order to return to activities such as running and volleyball without limitation and with reduced risk of reinjury.    PT Treatment/Interventions ADLs/Self Care Home Management;Electrical Stimulation;Ultrasound;Gait training;Stair training;Functional mobility training;Therapeutic activities;Therapeutic exercise;Patient/family education;Manual techniques;Passive range of motion;Scar mobilization;Taping    PT Next Visit Plan Progress HEP, progress strengthening and plyometrics as tolerated, dynamic balance    PT Home Exercise Plan [id number]    Consulted and  Agree with Plan of Care Patient             Patient will benefit from skilled therapeutic intervention in order to improve the following deficits and impairments:  Abnormal gait, Difficulty walking, Decreased safety awareness, Decreased activity tolerance, Decreased strength, Decreased mobility, Increased edema, Pain  Visit Diagnosis: Chronic pain of left knee  Stiffness of  left knee, not elsewhere classified  Other abnormalities of gait and mobility  Rupture of anterior cruciate ligament of left knee, subsequent encounter     Problem List There are no problems to display for this patient.   Rosana Hoes, PT, DPT, LAT, ATC 09/22/21  9:36 AM Phone: [phone] Fax: [phone]   [facility] Outpatient Rehabilitation [facility] [address] [place], [place], [address] Phone: [phone]   Fax:  [phone]  Name: Veola Cafaro. Vogan MRN: [id number] Date of Birth: [birth year]

## 2021-09-23 ENCOUNTER — Encounter: Payer: PRIVATE HEALTH INSURANCE | Admitting: Physical Therapy

## 2021-09-29 ENCOUNTER — Ambulatory Visit: Payer: 59 | Admitting: Physical Therapy

## 2021-09-29 ENCOUNTER — Encounter: Payer: Self-pay | Admitting: Physical Therapy

## 2021-09-29 ENCOUNTER — Other Ambulatory Visit: Payer: Self-pay

## 2021-09-29 DIAGNOSIS — R2689 Other abnormalities of gait and mobility: Secondary | ICD-10-CM

## 2021-09-29 DIAGNOSIS — M25562 Pain in left knee: Secondary | ICD-10-CM | POA: Diagnosis not present

## 2021-09-29 DIAGNOSIS — M25662 Stiffness of left knee, not elsewhere classified: Secondary | ICD-10-CM

## 2021-09-29 DIAGNOSIS — S83512D Sprain of anterior cruciate ligament of left knee, subsequent encounter: Secondary | ICD-10-CM

## 2021-09-29 NOTE — Therapy (Signed)
[facility] [address] [place], [place], [address] Phone: [phone]   Fax:  [phone]  Physical Therapy Treatment  Patient Details  Name: Cheryl Cole. Wilkowski MRN: [id number] Date of Birth: 2001-01-23 Referring Provider (PT): Bjorn Pippin, MD   Encounter Date: 09/29/2021   PT End of Session - 09/29/21 0839     Visit Number 42    Number of Visits 43    Date for PT Re-Evaluation 10/09/21    Authorization Type [employer] [facility] MCD    Authorization Time Period 08/21/2021 - 10/24/2021    Authorization - Visit Number 6    Authorization - Number of Visits 8    PT Start Time 0825    PT Stop Time 0915   10 min TPDN performed   PT Time Calculation (min) 50 min    Activity Tolerance Patient tolerated treatment well    Behavior During Therapy [facility] for tasks assessed/performed             Past Medical History:  Diagnosis Date   Torn meniscus     Past Surgical History:  Procedure Laterality Date   KNEE ARTHROSCOPY WITH ANTERIOR CRUCIATE LIGAMENT (ACL) REPAIR Left 02/05/2021   Procedure: KNEE ARTHROSCOPY WITH ANTERIOR CRUCIATE LIGAMENT (ACL) REPAIR WITH AUTOGRAFT;  Surgeon: Bjorn Pippin, MD;  Location: [facility];  Service: Orthopedics;  Laterality: Left;   KNEE ARTHROSCOPY WITH LATERAL MENISECTOMY  02/05/2021   Procedure: KNEE ARTHROSCOPY WITH LATERAL MENISECTOMY;  Surgeon: Bjorn Pippin, MD;  Location: [facility];  Service: Orthopedics;;   KNEE ARTHROSCOPY WITH MEDIAL MENISECTOMY Left 02/05/2021   Procedure: KNEE ARTHROSCOPY WITH  MEDIAL MENISCUS REPAIR;  Surgeon: Bjorn Pippin, MD;  Location: [facility];  Service: Orthopedics;  Laterality: Left;   NO PAST SURGERIES      There were no vitals filed for this visit.   Subjective Assessment - 09/29/21 0837     Subjective Patient reports she is feeling better, she has started to progress her weight again with her strengthening  exercises at home but does feel she continues to fatigue quickly with juming tasks.    Patient Stated Goals Get back to previous level of activity    Currently in Pain? No/denies                [facility] PT Assessment - 09/29/21 0001       Strength   Left Knee Flexion 4+/5    Left Knee Extension 4+/5                   [facility] Adult PT Treatment/Exercise:  Therapeutic Exercise: Prone quad stretch with strap x 60 sec Supine hamstring stretch with strap x 60 sec Slant board calf stretch x 60 sec Rear foot elevated split squat with 30# 3 x 8 each Supine bridge with slider hamstring curls 3 x 10 SLS on BOSU with ball toss 3 x 30 sec and mini-squat 3 x 5 (left) Barbell deadlift 105# 3 x 8 Walking lunges with 10# bilat 3 x 12 Side plank clamshell 3 x 10 each Knee extension machine 20# eccentric on left 3 x 6  Manual Therapy: N/A  Neuromuscular re-ed: N/A  Therapeutic Activity: N/A  Modalities: N/A  Self Care: N/A  Trigger Point Dry Needling Treatment: Pre-treatment instruction: Patient instructed on dry needling rationale, procedures, and possible side effects including pain during treatment (achy,cramping feeling), bruising, drop of blood, lightheadedness, nausea, sweating. Patient Consent  Given: Yes Education handout provided: No Muscles treated: left VL, RF, VM; left gastroc, left lateral hamstring  Needle size and number: .30x24mm x 4 Electrical stimulation performed: No Parameters: N/A Treatment response/outcome: Twitch response elicited, Palpable decrease in muscle tension, and patient report improved symptoms Post-treatment instructions: Patient instructed to expect possible mild to moderate muscle soreness later today and/or tomorrow. Patient instructed in methods to reduce muscle soreness and to continue prescribed HEP. If patient was dry needled over the lung field, patient was instructed on signs and symptoms of pneumothorax and, however unlikely, to  see immediate medical attention should they occur. Patient was also educated on signs and symptoms of infection and to seek medical attention should they occur. Patient verbalized understanding of these instructions and education.                  PT Education - 09/29/21 [phone]     Education Details HEP    Person(s) Educated Patient    Methods Explanation;Demonstration;Verbal cues    Comprehension Verbalized understanding;Returned demonstration;Verbal cues required;Need further instruction              PT Short Term Goals - 03/26/21 1053       PT SHORT TERM GOAL #1   Title Patient will increase knee flexion to 90 degrees    Baseline 120 - 03/26/21    Time 4    Period Weeks    Status Achieved    Target Date 03/07/21      PT SHORT TERM GOAL #2   Title Patient will demonstrate full extension    Baseline 0 deg knee extension AROM - 03/26/21    Time 4    Period Weeks    Status Achieved    Target Date 03/07/21      PT SHORT TERM GOAL #3   Title Patient will ambualte 300' without a device    Baseline no longer using AD with ambulation - 03/26/21    Time 4    Period Weeks    Status Achieved    Target Date 03/07/21               PT Long Term Goals - 09/22/21 0932       PT LONG TERM GOAL #1   Title Patient will be indeepdent with exercise program to promote quad strength and single leg stability    Baseline HEP continues to be updated for strengthening and plyometric progression, as well as home running progression    Time 8    Period Weeks    Status On-going    Target Date 10/09/21      PT LONG TERM GOAL #2   Title Patient will demonstrate a 30 second left single leg stance in order to progress to dynamic single leg strength and stability    Baseline Patient able to maintain SLS > 30 sec bilaterally - 03/26/21    Time 6    Period Weeks    Status Achieved      PT LONG TERM GOAL #3   Title Patient will demonstrate 5/5 MMT of left knee in order to  normalize running and landing mechanics    Baseline strength grossly 4+/5 MMT left knee, continues to exhibit strength deficit compared to opposite side    Time 8    Period Weeks    Status On-going    Target Date 10/09/21      PT LONG TERM GOAL #4   Title Patient will demonstrate >/= 4+/5 MMT  hip strength to improve dynamic knee control with landing while running or jumping    Baseline hip strength grossly 4/5 MMT bilaterally    Time 8    Period Weeks    Status On-going    Target Date 10/09/21      PT LONG TERM GOAL #5   Title Patient will be able to perform single leg squat x10 without deviation to demonstrate appropriate motor control    Baseline patient able to perform 10 reps SLS to 22" surface without deviation    Time 8    Period Weeks    Status Achieved      PT LONG TERM GOAL #6   Title Patient will report no increased pain or swelling with PT related activity or exercise in order to progress strength and to dynamic plyometric tasks    Baseline Patient denies any soreness or swelling following PT or activity at this time - 04/28/21    Time 6    Period Weeks    Status Achieved      PT LONG TERM GOAL #7   Title Patient will be able to run at self selected speed and without limitation or increase in pain in order to return to active lifestyle and exercise    Baseline Patient is progressing well with running progression, she continues to demonstrate deviations with stiff landing on left and is running at slower pace for decreased time    Time 8    Period Weeks    Status On-going    Target Date 10/09/21      PT LONG TERM GOAL #8   Title Patient will report >/= 79/80 on LEFS to indicate return to prior level of functional activity    Baseline LEFS 74/80, continues to have deficit with running/cutting/jumping related tasks    Time 8    Period Weeks    Status On-going    Target Date 10/09/21                   Plan - 09/29/21 0841     Clinical Impression  Statement Patient tolerated therapy well with no adverse effects. Continued with TPDN this visit to address muscular tension effecting left knee control and discomfort. Patient reported benefit with treatment. Continued progress of strengthening this visit with good tolerance. Did not perform any plyometric exercises this visit. No changes to HEP, patient instructed to continue progression of weight for home strengthening. Patient would benefit from continued skilled PT to progress her strength and plyometric control in order to return to activities such as running and volleyball without limitation and with reduced risk of reinjury.    PT Treatment/Interventions ADLs/Self Care Home Management;Electrical Stimulation;Ultrasound;Gait training;Stair training;Functional mobility training;Therapeutic activities;Therapeutic exercise;Patient/family education;Manual techniques;Passive range of motion;Scar mobilization;Taping    PT Next Visit Plan Progress HEP, progress strengthening and plyometrics as tolerated, dynamic balance    PT Home Exercise Plan [id number]    Consulted and Agree with Plan of Care Patient             Patient will benefit from skilled therapeutic intervention in order to improve the following deficits and impairments:  Abnormal gait, Difficulty walking, Decreased safety awareness, Decreased activity tolerance, Decreased strength, Decreased mobility, Increased edema, Pain  Visit Diagnosis: Chronic pain of left knee  Stiffness of left knee, not elsewhere classified  Other abnormalities of gait and mobility  Rupture of anterior cruciate ligament of left knee, subsequent encounter     Problem List There are no problems  to display for this patient.   Rosana Hoes, PT, DPT, LAT, ATC 09/29/21  9:16 AM Phone: [phone] Fax: [phone]   [facility] Outpatient Rehabilitation [facility] [address] [place], [place], [address] Phone: [phone]   Fax:   [phone]  Name: Maral Lampe. Grogan MRN: [id number] Date of Birth: April 25, 2001

## 2021-09-30 ENCOUNTER — Encounter: Payer: PRIVATE HEALTH INSURANCE | Admitting: Physical Therapy

## 2021-10-08 ENCOUNTER — Encounter: Payer: Self-pay | Admitting: Physical Therapy

## 2021-10-08 ENCOUNTER — Ambulatory Visit: Payer: 59 | Attending: Orthopaedic Surgery | Admitting: Physical Therapy

## 2021-10-08 ENCOUNTER — Other Ambulatory Visit: Payer: Self-pay

## 2021-10-08 DIAGNOSIS — G8929 Other chronic pain: Secondary | ICD-10-CM | POA: Diagnosis present

## 2021-10-08 DIAGNOSIS — R2689 Other abnormalities of gait and mobility: Secondary | ICD-10-CM | POA: Diagnosis present

## 2021-10-08 DIAGNOSIS — M25662 Stiffness of left knee, not elsewhere classified: Secondary | ICD-10-CM | POA: Diagnosis present

## 2021-10-08 DIAGNOSIS — S83512D Sprain of anterior cruciate ligament of left knee, subsequent encounter: Secondary | ICD-10-CM | POA: Diagnosis present

## 2021-10-08 DIAGNOSIS — M25562 Pain in left knee: Secondary | ICD-10-CM | POA: Diagnosis not present

## 2021-10-08 NOTE — Therapy (Signed)
[facility] Outpatient Rehabilitation [facility] [address] [place], [place], [address] Phone: [phone]   Fax:  [phone]  Physical Therapy Treatment / ERO  Patient Details  Name: Cheryl Cole. Wisdom MRN: [id number] Date of Birth: 2001-10-10 Referring Provider (PT): Bjorn Pippin, MD   Encounter Date: 10/08/2021   PT End of Session - 10/08/21 0819     Visit Number 43    Number of Visits 50    Date for PT Re-Evaluation 12/03/21    Authorization Type [employer] [facility] MCD    Authorization Time Period 08/21/2021 - 10/24/2021    Authorization - Visit Number 7    Authorization - Number of Visits 8    PT Start Time 0820    PT Stop Time 0910    PT Time Calculation (min) 50 min    Activity Tolerance Patient tolerated treatment well    Behavior During Therapy [facility] for tasks assessed/performed             Past Medical History:  Diagnosis Date   Torn meniscus     Past Surgical History:  Procedure Laterality Date   KNEE ARTHROSCOPY WITH ANTERIOR CRUCIATE LIGAMENT (ACL) REPAIR Left 02/05/2021   Procedure: KNEE ARTHROSCOPY WITH ANTERIOR CRUCIATE LIGAMENT (ACL) REPAIR WITH AUTOGRAFT;  Surgeon: Bjorn Pippin, MD;  Location: [facility];  Service: Orthopedics;  Laterality: Left;   KNEE ARTHROSCOPY WITH LATERAL MENISECTOMY  02/05/2021   Procedure: KNEE ARTHROSCOPY WITH LATERAL MENISECTOMY;  Surgeon: Bjorn Pippin, MD;  Location: [facility];  Service: Orthopedics;;   KNEE ARTHROSCOPY WITH MEDIAL MENISECTOMY Left 02/05/2021   Procedure: KNEE ARTHROSCOPY WITH  MEDIAL MENISCUS REPAIR;  Surgeon: Bjorn Pippin, MD;  Location: [facility];  Service: Orthopedics;  Laterality: Left;   NO PAST SURGERIES      There were no vitals filed for this visit.   Subjective Assessment - 10/08/21 0817     Subjective Patient reports she is doing well with no new issues. She reports she has been incorporating more jumping again but has not been  running. She has been progressing her weigth again with strengthening and denies any onset of pain.    Patient Stated Goals Get back to previous level of activity    Currently in Pain? No/denies                [facility] PT Assessment - 10/08/21 0001       Assessment   Medical Diagnosis L ACL with BTB graft and Medial meniscal reapair    Referring Provider (PT) Bjorn Pippin, MD    Onset Date/Surgical Date 02/05/21      Precautions   Precautions None      Restrictions   Weight Bearing Restrictions No      Prior Function   Level of Independence Independent      Observation/Other Assessments   Lower Extremity Functional Scale  74      Jumping   Comments DL jumping demonstrates weight shift toward right, SL jumping with decreased height and distance and stiffer landing on left with decreased knee flexion and increased forward trunk lean      Running   Comments Patient continues to demonstrate slight decreased knee flexion in stance, indicating decreased quad control with stiffer landing      Strength   Left Hip Extension 4/5    Left Hip ABduction 4/5    Left Knee Flexion 4+/5    Left Knee Extension 4+/5  [facility] Adult PT Treatment/Exercise:   Therapeutic Exercise: Dynamic warm-up: jog, side shuffle, walking hamstring / quad stretch, forward lunge twist, reverse lunge, lateral lunge, broad jump  Back squat with barbell 3 x 8 - heels propped for quad emphasis SL RDL with 10# 3 x 6  Rear foot elevated split squat with 45# 3 x 6 each Lateral step-up 10" box 3 x 8  Knee extension machine 20# eccentric on left 3 x 6 SL balance on Airex with wall ball bounce 3 x 40 sec  Calf stretch at counter 3 x 30 sec   Manual Therapy: N/A   Neuromuscular re-ed: N/A   Therapeutic Activity: N/A   Modalities: N/A   Self Care: N/A                  PT Education - 10/08/21 0819     Education Details POC extension and update, HEP,  continued focus on strengthening and calf stretch    Person(s) Educated Patient    Methods Explanation;Demonstration;Verbal cues    Comprehension Verbalized understanding;Returned demonstration;Verbal cues required;Need further instruction              PT Short Term Goals - 03/26/21 1053       PT SHORT TERM GOAL #1   Title Patient will increase knee flexion to 90 degrees    Baseline 120 - 03/26/21    Time 4    Period Weeks    Status Achieved    Target Date 03/07/21      PT SHORT TERM GOAL #2   Title Patient will demonstrate full extension    Baseline 0 deg knee extension AROM - 03/26/21    Time 4    Period Weeks    Status Achieved    Target Date 03/07/21      PT SHORT TERM GOAL #3   Title Patient will ambualte 300' without a device    Baseline no longer using AD with ambulation - 03/26/21    Time 4    Period Weeks    Status Achieved    Target Date 03/07/21               PT Long Term Goals - 10/08/21 0906       PT LONG TERM GOAL #1   Title Patient will be indeepdent with exercise program to promote quad strength and single leg stability    Baseline HEP continues to be updated for strengthening and plyometric progression, as well as home running progression    Time 8    Period Weeks    Status On-going    Target Date 12/03/21      PT LONG TERM GOAL #2   Title Patient will demonstrate a 30 second left single leg stance in order to progress to dynamic single leg strength and stability    Baseline Patient able to maintain SLS > 30 sec bilaterally - 03/26/21    Time 6    Period Weeks    Status Achieved      PT LONG TERM GOAL #3   Title Patient will demonstrate 5/5 MMT of left knee in order to normalize running and landing mechanics    Baseline Strength grossly 4+/5 MMT left knee, continues to exhibit strength deficit compared to opposite side    Time 8    Period Weeks    Status On-going    Target Date 12/03/21      PT LONG TERM GOAL #4   Title Patient will  demonstrate >/= 4+/5 MMT hip strength to improve dynamic knee control with landing while running or jumping    Baseline hip strength grossly 4/5 MMT bilaterally    Time 8    Period Weeks    Status On-going    Target Date 12/03/21      PT LONG TERM GOAL #5   Title Patient will be able to perform single leg squat x10 without deviation to demonstrate appropriate motor control    Baseline patient able to perform 10 reps SLS to 22" surface without deviation    Time 8    Period Weeks    Status Achieved      PT LONG TERM GOAL #6   Title Patient will report no increased pain or swelling with PT related activity or exercise in order to progress strength and to dynamic plyometric tasks    Baseline Patient denies any soreness or swelling following PT or activity at this time - 04/28/21    Time 6    Period Weeks    Status Achieved      PT LONG TERM GOAL #7   Title Patient will be able to run at self selected speed and without limitation or increase in pain in order to return to active lifestyle and exercise    Baseline Patient is progressing well with running progression, she continues to demonstrate deviations with stiff landing on left and is running at slower pace for decreased time    Time 8    Period Weeks    Status On-going    Target Date 12/03/21      PT LONG TERM GOAL #8   Title Patient will report >/= 79/80 on LEFS to indicate return to prior level of functional activity    Baseline LEFS 74/80, continues to have deficit with running/cutting/jumping related tasks    Time 8    Period Weeks    Status On-going    Target Date 12/03/21                   Plan - 10/08/21 1610     Clinical Impression Statement Patient tolerated therapy well with no adverse effects. She continues to demonstrate gross strength deficit on the right leading to impairments with squatting, jumping, landing, and single leg tasks. Therapy continues to focus mainly on progressing her strength, targeting  primarly quad muscle group. She does demonstrate slight calf tightness on left compared to right that may be contributing to impaired movement mechanics so encouraged continue calf stretching and mobilizations. Patient would benefit from continued skilled PT to progress her strength and plyometric control in order to return to activities such as running and volleyball without limitation and with reduced risk of reinjury, so will re-cert POC for 8 more weeks and will plan to transition to every other week.    PT Frequency 1x / week    PT Duration 8 weeks    PT Treatment/Interventions ADLs/Self Care Home Management;Electrical Stimulation;Ultrasound;Gait training;Stair training;Functional mobility training;Therapeutic activities;Therapeutic exercise;Patient/family education;Manual techniques;Passive range of motion;Scar mobilization;Taping    PT Next Visit Plan Progress HEP, progress strengthening and plyometrics as tolerated, dynamic balance    PT Home Exercise Plan [id number]    Consulted and Agree with Plan of Care Patient             Patient will benefit from skilled therapeutic intervention in order to improve the following deficits and impairments:  Abnormal gait, Difficulty walking, Decreased safety awareness, Decreased activity tolerance, Decreased strength, Decreased mobility, Increased  edema, Pain  Visit Diagnosis: Chronic pain of left knee  Stiffness of left knee, not elsewhere classified  Other abnormalities of gait and mobility  Rupture of anterior cruciate ligament of left knee, subsequent encounter     Problem List There are no problems to display for this patient.   Rosana Hoes, PT, DPT, LAT, ATC 10/08/21  9:15 AM Phone: [phone] Fax: [phone]   [facility] Outpatient Rehabilitation [facility] [address] [place], [place], [address] Phone: [phone]   Fax:  [phone]  Name: Ellysia Char. Postell MRN: [id number] Date of Birth:  11-Nov-2000

## 2021-10-09 ENCOUNTER — Encounter: Payer: 59 | Admitting: Physical Therapy

## 2021-10-17 ENCOUNTER — Encounter: Payer: Self-pay | Admitting: Physical Therapy

## 2021-10-17 ENCOUNTER — Ambulatory Visit: Payer: 59 | Admitting: Physical Therapy

## 2021-10-17 ENCOUNTER — Other Ambulatory Visit: Payer: Self-pay

## 2021-10-17 DIAGNOSIS — M25562 Pain in left knee: Secondary | ICD-10-CM | POA: Diagnosis not present

## 2021-10-17 DIAGNOSIS — G8929 Other chronic pain: Secondary | ICD-10-CM

## 2021-10-17 DIAGNOSIS — S83512D Sprain of anterior cruciate ligament of left knee, subsequent encounter: Secondary | ICD-10-CM

## 2021-10-17 DIAGNOSIS — M25662 Stiffness of left knee, not elsewhere classified: Secondary | ICD-10-CM

## 2021-10-17 DIAGNOSIS — R2689 Other abnormalities of gait and mobility: Secondary | ICD-10-CM

## 2021-10-17 NOTE — Therapy (Signed)
[facility] Outpatient Rehabilitation [facility] [address] [place], [place], [address] Phone: [phone]   Fax:  [phone]  Physical Therapy Treatment  Patient Details  Name: Cheryl Cole MRN: [id number] Date of Birth: [birth year] Referring Provider (PT): Bjorn Pippin, MD   Encounter Date: 10/17/2021   PT End of Session - 10/17/21 0832     Visit Number 44    Number of Visits 50    Date for PT Re-Evaluation 12/03/21    Authorization Type [employer] [facility] MCD    Authorization Time Period 08/21/2021 - 10/24/2021    Authorization - Visit Number [id number]    Authorization - Number of Visits 8    PT Start Time 0830    PT Stop Time 0915    PT Time Calculation (min) 45 min    Activity Tolerance Patient tolerated treatment well    Behavior During Therapy [facility] for tasks assessed/performed             Past Medical History:  Diagnosis Date   Torn meniscus     Past Surgical History:  Procedure Laterality Date   KNEE ARTHROSCOPY WITH ANTERIOR CRUCIATE LIGAMENT (ACL) REPAIR Left 02/05/2021   Procedure: KNEE ARTHROSCOPY WITH ANTERIOR CRUCIATE LIGAMENT (ACL) REPAIR WITH AUTOGRAFT;  Surgeon: Bjorn Pippin, MD;  Location: [facility];  Service: Orthopedics;  Laterality: Left;   KNEE ARTHROSCOPY WITH LATERAL MENISECTOMY  02/05/2021   Procedure: KNEE ARTHROSCOPY WITH LATERAL MENISECTOMY;  Surgeon: Bjorn Pippin, MD;  Location: [facility];  Service: Orthopedics;;   KNEE ARTHROSCOPY WITH MEDIAL MENISECTOMY Left 02/05/2021   Procedure: KNEE ARTHROSCOPY WITH  MEDIAL MENISCUS REPAIR;  Surgeon: Bjorn Pippin, MD;  Location:  SURGERY CENTER;  Service: Orthopedics;  Laterality: Left;   NO PAST SURGERIES      There were no vitals filed for this visit.   Subjective Assessment - 10/17/21 0834     Subjective Patient reports she is doing much better, not having any pain. She has been running more often and has returned to previous level of  strengthening.    Patient Stated Goals Get back to previous level of activity    Currently in Pain? No/denies                       [facility] Adult PT Treatment/Exercise:   Therapeutic Exercise: Dynamic warm-up: jog, side shuffle, walking hamstring / quad stretch, forward lunge twist, reverse lunge, lateral lunge, broad jump, high skip Calf stretch at wall 3 x 30 sec  DL vertical jump with mirror feedback SL jump on pilates reformer -  1 red, 1 blue, 1 yellow springs   Barbell back squat 75# 3 x 6 - heels propped for quad emphasis Resisted side shuffle with green power band x 3 lengths   Lateral heel tap 8" box with heel prop and UE support 3 x 6 Nordic hamstring 3 x 6    Knee extension machine 20# on left 3 x 6             PT Education - 10/17/21 0835     Education Details HEP    Person(s) Educated Patient    Methods Explanation;Demonstration;Verbal cues;Handout    Comprehension Verbalized understanding;Returned demonstration;Verbal cues required;Need further instruction              PT Short Term Goals - 03/26/21 1053       PT SHORT TERM GOAL #1   Title Patient  will increase knee flexion to 90 degrees    Baseline 120 - 03/26/21    Time 4    Period Weeks    Status Achieved    Target Date 03/07/21      PT SHORT TERM GOAL #2   Title Patient will demonstrate full extension    Baseline 0 deg knee extension AROM - 03/26/21    Time 4    Period Weeks    Status Achieved    Target Date 03/07/21      PT SHORT TERM GOAL #3   Title Patient will ambualte 300' without a device    Baseline no longer using AD with ambulation - 03/26/21    Time 4    Period Weeks    Status Achieved    Target Date 03/07/21               PT Long Term Goals - 10/08/21 0906       PT LONG TERM GOAL #1   Title Patient will be indeepdent with exercise program to promote quad strength and single leg stability    Baseline HEP continues to be updated for strengthening  and plyometric progression, as well as home running progression    Time 8    Period Weeks    Status On-going    Target Date 12/03/21      PT LONG TERM GOAL #2   Title Patient will demonstrate a 30 second left single leg stance in order to progress to dynamic single leg strength and stability    Baseline Patient able to maintain SLS > 30 sec bilaterally - 03/26/21    Time 6    Period Weeks    Status Achieved      PT LONG TERM GOAL #3   Title Patient will demonstrate 5/5 MMT of left knee in order to normalize running and landing mechanics    Baseline Strength grossly 4+/5 MMT left knee, continues to exhibit strength deficit compared to opposite side    Time 8    Period Weeks    Status On-going    Target Date 12/03/21      PT LONG TERM GOAL #4   Title Patient will demonstrate >/= 4+/5 MMT hip strength to improve dynamic knee control with landing while running or jumping    Baseline hip strength grossly 4/5 MMT bilaterally    Time 8    Period Weeks    Status On-going    Target Date 12/03/21      PT LONG TERM GOAL #5   Title Patient will be able to perform single leg squat x10 without deviation to demonstrate appropriate motor control    Baseline patient able to perform 10 reps SLS to 22" surface without deviation    Time 8    Period Weeks    Status Achieved      PT LONG TERM GOAL #6   Title Patient will report no increased pain or swelling with PT related activity or exercise in order to progress strength and to dynamic plyometric tasks    Baseline Patient denies any soreness or swelling following PT or activity at this time - 04/28/21    Time 6    Period Weeks    Status Achieved      PT LONG TERM GOAL #7   Title Patient will be able to run at self selected speed and without limitation or increase in pain in order to return to active lifestyle and exercise  Baseline Patient is progressing well with running progression, she continues to demonstrate deviations with stiff  landing on left and is running at slower pace for decreased time    Time 8    Period Weeks    Status On-going    Target Date 12/03/21      PT LONG TERM GOAL #8   Title Patient will report >/= 79/80 on LEFS to indicate return to prior level of functional activity    Baseline LEFS 74/80, continues to have deficit with running/cutting/jumping related tasks    Time 8    Period Weeks    Status On-going    Target Date 12/03/21                   Plan - 10/17/21 0836     Clinical Impression Statement Patient tolerated therapy well with no adverse effects. Therapy incorporated more plyometric and jump training this viist and continued to progress strengthening. Overall strength and control of left leg/quad continue to be main limiting factor in progression of plyometrics. She does continue to demonstrate weight shift decreased eccentric quad control with landing. Patient would benefit from continued skilled PT to progress her strength and plyometric control in order to return to activities such as running and volleyball without limitation and with reduced risk of reinjury.    PT Treatment/Interventions ADLs/Self Care Home Management;Electrical Stimulation;Ultrasound;Gait training;Stair training;Functional mobility training;Therapeutic activities;Therapeutic exercise;Patient/family education;Manual techniques;Passive range of motion;Scar mobilization;Taping    PT Next Visit Plan Progress HEP, progress strengthening and plyometrics as tolerated, dynamic balance    PT Home Exercise Plan [id number]    Consulted and Agree with Plan of Care Patient             Patient will benefit from skilled therapeutic intervention in order to improve the following deficits and impairments:  Abnormal gait, Difficulty walking, Decreased safety awareness, Decreased activity tolerance, Decreased strength, Decreased mobility, Increased edema, Pain  Visit Diagnosis: Chronic pain of left knee  Stiffness of  left knee, not elsewhere classified  Other abnormalities of gait and mobility  Rupture of anterior cruciate ligament of left knee, subsequent encounter     Problem List There are no problems to display for this patient.   Rosana Hoes, PT, DPT, LAT, ATC 10/17/21  9:20 AM Phone: [phone] Fax: [phone]   [facility] Outpatient Rehabilitation [facility] [address] [place], [place], [address] Phone: [phone]   Fax:  [phone]  Name: Cheryl Cole MRN: [id number] Date of Birth: 02-04-2001

## 2021-10-23 ENCOUNTER — Ambulatory Visit: Payer: 59 | Admitting: Physical Therapy

## 2021-10-23 ENCOUNTER — Other Ambulatory Visit: Payer: Self-pay

## 2021-10-23 ENCOUNTER — Encounter: Payer: Self-pay | Admitting: Physical Therapy

## 2021-10-23 DIAGNOSIS — S83512D Sprain of anterior cruciate ligament of left knee, subsequent encounter: Secondary | ICD-10-CM

## 2021-10-23 DIAGNOSIS — M25662 Stiffness of left knee, not elsewhere classified: Secondary | ICD-10-CM

## 2021-10-23 DIAGNOSIS — M25562 Pain in left knee: Secondary | ICD-10-CM | POA: Diagnosis not present

## 2021-10-23 DIAGNOSIS — R2689 Other abnormalities of gait and mobility: Secondary | ICD-10-CM

## 2021-10-23 DIAGNOSIS — G8929 Other chronic pain: Secondary | ICD-10-CM

## 2021-10-23 NOTE — Therapy (Signed)
[facility] Outpatient Rehabilitation [facility] [address] [place], [place], [address] Phone: [phone]   Fax:  [phone]  Physical Therapy Treatment  Patient Details  Name: Cheryl Cole MRN: [id number] Date of Birth: [birth year] Referring Provider (PT): Bjorn Pippin, MD   Encounter Date: 10/23/2021   PT End of Session - 10/23/21 0825     Visit Number 45    Number of Visits 50    Date for PT Re-Evaluation 12/03/21    Authorization Type [employer] [facility] MCD    PT Start Time 0825    PT Stop Time 0915    PT Time Calculation (min) 50 min    Activity Tolerance Patient tolerated treatment well    Behavior During Therapy [facility] for tasks assessed/performed             Past Medical History:  Diagnosis Date   Torn meniscus     Past Surgical History:  Procedure Laterality Date   KNEE ARTHROSCOPY WITH ANTERIOR CRUCIATE LIGAMENT (ACL) REPAIR Left 02/05/2021   Procedure: KNEE ARTHROSCOPY WITH ANTERIOR CRUCIATE LIGAMENT (ACL) REPAIR WITH AUTOGRAFT;  Surgeon: Bjorn Pippin, MD;  Location: [facility];  Service: Orthopedics;  Laterality: Left;   KNEE ARTHROSCOPY WITH LATERAL MENISECTOMY  02/05/2021   Procedure: KNEE ARTHROSCOPY WITH LATERAL MENISECTOMY;  Surgeon: Bjorn Pippin, MD;  Location: [facility];  Service: Orthopedics;;   KNEE ARTHROSCOPY WITH MEDIAL MENISECTOMY Left 02/05/2021   Procedure: KNEE ARTHROSCOPY WITH  MEDIAL MENISCUS REPAIR;  Surgeon: Bjorn Pippin, MD;  Location: [facility];  Service: Orthopedics;  Laterality: Left;   NO PAST SURGERIES      There were no vitals filed for this visit.   Subjective Assessment - 10/23/21 0828     Subjective Patient reports she continues to feel like she is improving. No new issues and denies any pain this visit.    Patient Stated Goals Get back to previous level of activity    Currently in Pain? No/denies                [facility] PT Assessment - 10/23/21 0001        Strength   Right Knee Flexion 5/5    Right Knee Extension 5/5    Left Knee Flexion 4+/5    Left Knee Extension 4+/5                  [facility] Adult PT Treatment/Exercise:   Therapeutic Exercise: Dynamic warm-up: run at 50%-75%-90%, side shuffle, high knees, butt kicks, high skip Calf stretch at wall 3 x 30 sec   DL vertical jump focusing on quick load and explosive movement x 10 SL rear foot elevated jumps with focusing on quick load and explosive movement 2 x 10   Leg press (cybex) SL 100# 3 x 6 (right 120# 3 x 6) Knee extension machine 20# on left 3 x 6 Forward step-up 12" with slow decent sliding toe down box hold 10# bilat 3 x 5 Nordic hamstring curl 3 x 5 Wall sit 3 x 30 sec           PT Education - 10/23/21 0829     Education Details HEP, incorporating faster running into program    Person(s) Educated Patient    Methods Explanation;Demonstration;Verbal cues    Comprehension Verbalized understanding;Returned demonstration;Verbal cues required;Need further instruction              PT Short Term Goals - 03/26/21  1053       PT SHORT TERM GOAL #1   Title Patient will increase knee flexion to 90 degrees    Baseline 120 - 03/26/21    Time 4    Period Weeks    Status Achieved    Target Date 03/07/21      PT SHORT TERM GOAL #2   Title Patient will demonstrate full extension    Baseline 0 deg knee extension AROM - 03/26/21    Time 4    Period Weeks    Status Achieved    Target Date 03/07/21      PT SHORT TERM GOAL #3   Title Patient will ambualte 300' without a device    Baseline no longer using AD with ambulation - 03/26/21    Time 4    Period Weeks    Status Achieved    Target Date 03/07/21               PT Long Term Goals - 10/08/21 0906       PT LONG TERM GOAL #1   Title Patient will be indeepdent with exercise program to promote quad strength and single leg stability    Baseline HEP continues to be updated for strengthening  and plyometric progression, as well as home running progression    Time 8    Period Weeks    Status On-going    Target Date 12/03/21      PT LONG TERM GOAL #2   Title Patient will demonstrate a 30 second left single leg stance in order to progress to dynamic single leg strength and stability    Baseline Patient able to maintain SLS > 30 sec bilaterally - 03/26/21    Time 6    Period Weeks    Status Achieved      PT LONG TERM GOAL #3   Title Patient will demonstrate 5/5 MMT of left knee in order to normalize running and landing mechanics    Baseline Strength grossly 4+/5 MMT left knee, continues to exhibit strength deficit compared to opposite side    Time 8    Period Weeks    Status On-going    Target Date 12/03/21      PT LONG TERM GOAL #4   Title Patient will demonstrate >/= 4+/5 MMT hip strength to improve dynamic knee control with landing while running or jumping    Baseline hip strength grossly 4/5 MMT bilaterally    Time 8    Period Weeks    Status On-going    Target Date 12/03/21      PT LONG TERM GOAL #5   Title Patient will be able to perform single leg squat x10 without deviation to demonstrate appropriate motor control    Baseline patient able to perform 10 reps SLS to 22" surface without deviation    Time 8    Period Weeks    Status Achieved      PT LONG TERM GOAL #6   Title Patient will report no increased pain or swelling with PT related activity or exercise in order to progress strength and to dynamic plyometric tasks    Baseline Patient denies any soreness or swelling following PT or activity at this time - 04/28/21    Time 6    Period Weeks    Status Achieved      PT LONG TERM GOAL #7   Title Patient will be able to run at self selected speed and without limitation  or increase in pain in order to return to active lifestyle and exercise    Baseline Patient is progressing well with running progression, she continues to demonstrate deviations with stiff  landing on left and is running at slower pace for decreased time    Time 8    Period Weeks    Status On-going    Target Date 12/03/21      PT LONG TERM GOAL #8   Title Patient will report >/= 79/80 on LEFS to indicate return to prior level of functional activity    Baseline LEFS 74/80, continues to have deficit with running/cutting/jumping related tasks    Time 8    Period Weeks    Status On-going    Target Date 12/03/21                   Plan - 10/23/21 0831     Clinical Impression Statement Patient tolerated therapy well with no adverse effects. Therapy focused on progressing power and strength this visit. She continues to demonstrate left side quad weakness that is hindering progress with her jumping and progression to previous activity level. Strengthening continues to focus on progressing quad strength and patient highly encouraged to progress weight as tolerated for further strength improvement. Patient would benefit from continued skilled PT to progress her strength and plyometric control in order to return to activities such as running and volleyball without limitation and with reduced risk of reinjury.    PT Treatment/Interventions ADLs/Self Care Home Management;Electrical Stimulation;Ultrasound;Gait training;Stair training;Functional mobility training;Therapeutic activities;Therapeutic exercise;Patient/family education;Manual techniques;Passive range of motion;Scar mobilization;Taping    PT Next Visit Plan Progress HEP, progress strengthening and plyometrics as tolerated, dynamic balance    PT Home Exercise Plan [id number]    Consulted and Agree with Plan of Care Patient             Patient will benefit from skilled therapeutic intervention in order to improve the following deficits and impairments:  Abnormal gait, Difficulty walking, Decreased safety awareness, Decreased activity tolerance, Decreased strength, Decreased mobility, Increased edema, Pain  Visit  Diagnosis: Chronic pain of left knee  Stiffness of left knee, not elsewhere classified  Other abnormalities of gait and mobility  Rupture of anterior cruciate ligament of left knee, subsequent encounter     Problem List There are no problems to display for this patient.   Rosana Hoes, PT, DPT, LAT, ATC 10/23/21  9:15 AM Phone: [phone] Fax: [phone]   [facility] Outpatient Rehabilitation [facility] [address] [place], [place], [address] Phone: [phone]   Fax:  [phone]  Name: Cheryl Cole MRN: [id number] Date of Birth: 02-07-2001

## 2021-10-31 ENCOUNTER — Encounter: Payer: Self-pay | Admitting: Physical Therapy

## 2021-10-31 ENCOUNTER — Ambulatory Visit: Payer: 59 | Admitting: Physical Therapy

## 2021-10-31 ENCOUNTER — Other Ambulatory Visit: Payer: Self-pay

## 2021-10-31 DIAGNOSIS — M25662 Stiffness of left knee, not elsewhere classified: Secondary | ICD-10-CM

## 2021-10-31 DIAGNOSIS — M25562 Pain in left knee: Secondary | ICD-10-CM | POA: Diagnosis not present

## 2021-10-31 DIAGNOSIS — R2689 Other abnormalities of gait and mobility: Secondary | ICD-10-CM

## 2021-10-31 DIAGNOSIS — S83512D Sprain of anterior cruciate ligament of left knee, subsequent encounter: Secondary | ICD-10-CM

## 2021-10-31 NOTE — Therapy (Signed)
[facility] Outpatient Rehabilitation [facility] [address] [place], [place], [address] Phone: [phone]   Fax:  [phone]  Physical Therapy Treatment  Patient Details  Name: Cheryl Cole. Schwarz MRN: [id number] Date of Birth: 2001-06-11 Referring Provider (PT): Bjorn Pippin, MD   Encounter Date: 10/31/2021   PT End of Session - 10/31/21 0835     Visit Number 46    Number of Visits 50    Date for PT Re-Evaluation 12/03/21    Authorization Type [employer] [facility] MCD    PT Start Time 0832    PT Stop Time 0915    PT Time Calculation (min) 43 min    Activity Tolerance Patient tolerated treatment well    Behavior During Therapy [facility] for tasks assessed/performed             Past Medical History:  Diagnosis Date   Torn meniscus     Past Surgical History:  Procedure Laterality Date   KNEE ARTHROSCOPY WITH ANTERIOR CRUCIATE LIGAMENT (ACL) REPAIR Left 02/05/2021   Procedure: KNEE ARTHROSCOPY WITH ANTERIOR CRUCIATE LIGAMENT (ACL) REPAIR WITH AUTOGRAFT;  Surgeon: Bjorn Pippin, MD;  Location: [facility];  Service: Orthopedics;  Laterality: Left;   KNEE ARTHROSCOPY WITH LATERAL MENISECTOMY  02/05/2021   Procedure: KNEE ARTHROSCOPY WITH LATERAL MENISECTOMY;  Surgeon: Bjorn Pippin, MD;  Location: [facility];  Service: Orthopedics;;   KNEE ARTHROSCOPY WITH MEDIAL MENISECTOMY Left 02/05/2021   Procedure: KNEE ARTHROSCOPY WITH  MEDIAL MENISCUS REPAIR;  Surgeon: Bjorn Pippin, MD;  Location: [facility];  Service: Orthopedics;  Laterality: Left;   NO PAST SURGERIES      There were no vitals filed for this visit.   Subjective Assessment - 10/31/21 0840     Subjective Patient reports she is doing well. No new issues. She is consistent with her exercises and she has been doing more jumping, she feels she is getting stronger.    Patient Stated Goals Get back to previous level of activity    Currently in Pain? No/denies                 [facility] PT Assessment - 10/31/21 0001       Strength   Left Knee Flexion 4+/5    Left Knee Extension 4+/5                 [facility] Adult PT Treatment/Exercise:   Therapeutic Exercise: Dynamic warm-up: run at 50%-75%-90%, sprint with focus on deceleration mechanics   SL rear foot elevated jumps using TRX with focusing on quick load and explosive movement 2 x 10 Lateral lunge to BOSU 2 x 10  Forward lunge with resistance forward 2 x 12 - focus on eccentric quad control and explosive movement Eccentric forward walking and resisted retro walking x 2 lengths each - focus on keeping hips low and allowing forward knee translation to improve quad control   Leg press (cybex) SL 80# x 8, 100# 2 x 6, eccentric on left 120# 2 x 5 Forward step-down heel tap 6" box holding 10# at chest 2 x 6 Knee extension machine 20# on left 2 x 6 eccentric on left 35# 2 x 5             PT Education - 10/31/21 0842     Education Details HEP    Person(s) Educated Patient    Methods Explanation;Demonstration;Verbal cues;Handout    Comprehension Verbalized understanding;Returned demonstration;Verbal cues required;Need further instruction  PT Short Term Goals - 03/26/21 1053       PT SHORT TERM GOAL #1   Title Patient will increase knee flexion to 90 degrees    Baseline 120 - 03/26/21    Time 4    Period Weeks    Status Achieved    Target Date 03/07/21      PT SHORT TERM GOAL #2   Title Patient will demonstrate full extension    Baseline 0 deg knee extension AROM - 03/26/21    Time 4    Period Weeks    Status Achieved    Target Date 03/07/21      PT SHORT TERM GOAL #3   Title Patient will ambualte 300' without a device    Baseline no longer using AD with ambulation - 03/26/21    Time 4    Period Weeks    Status Achieved    Target Date 03/07/21               PT Long Term Goals - 10/08/21 0906       PT LONG TERM GOAL #1   Title Patient will  be indeepdent with exercise program to promote quad strength and single leg stability    Baseline HEP continues to be updated for strengthening and plyometric progression, as well as home running progression    Time 8    Period Weeks    Status On-going    Target Date 12/03/21      PT LONG TERM GOAL #2   Title Patient will demonstrate a 30 second left single leg stance in order to progress to dynamic single leg strength and stability    Baseline Patient able to maintain SLS > 30 sec bilaterally - 03/26/21    Time 6    Period Weeks    Status Achieved      PT LONG TERM GOAL #3   Title Patient will demonstrate 5/5 MMT of left knee in order to normalize running and landing mechanics    Baseline Strength grossly 4+/5 MMT left knee, continues to exhibit strength deficit compared to opposite side    Time 8    Period Weeks    Status On-going    Target Date 12/03/21      PT LONG TERM GOAL #4   Title Patient will demonstrate >/= 4+/5 MMT hip strength to improve dynamic knee control with landing while running or jumping    Baseline hip strength grossly 4/5 MMT bilaterally    Time 8    Period Weeks    Status On-going    Target Date 12/03/21      PT LONG TERM GOAL #5   Title Patient will be able to perform single leg squat x10 without deviation to demonstrate appropriate motor control    Baseline patient able to perform 10 reps SLS to 22" surface without deviation    Time 8    Period Weeks    Status Achieved      PT LONG TERM GOAL #6   Title Patient will report no increased pain or swelling with PT related activity or exercise in order to progress strength and to dynamic plyometric tasks    Baseline Patient denies any soreness or swelling following PT or activity at this time - 04/28/21    Time 6    Period Weeks    Status Achieved      PT LONG TERM GOAL #7   Title Patient will be able to run at  self selected speed and without limitation or increase in pain in order to return to active  lifestyle and exercise    Baseline Patient is progressing well with running progression, she continues to demonstrate deviations with stiff landing on left and is running at slower pace for decreased time    Time 8    Period Weeks    Status On-going    Target Date 12/03/21      PT LONG TERM GOAL #8   Title Patient will report >/= 79/80 on LEFS to indicate return to prior level of functional activity    Baseline LEFS 74/80, continues to have deficit with running/cutting/jumping related tasks    Time 8    Period Weeks    Status On-going    Target Date 12/03/21                   Plan - 10/31/21 0901     Clinical Impression Statement Patient tolerated therapy well with no adverse effects. Therapy focused on continued stengthening eccentric control of left quad to improve landing and deceleration mechanics. She continues to demonstrate weakness of the LLE limiting return to prior level of function. She was instructed to continue gradual increase in weight for home exercises to progress strength. Patient would benefit from continued skilled PT to progress her strength and plyometric control in order to return to activities such as running and volleyball without limitation and with reduced risk of reinjury.    PT Treatment/Interventions ADLs/Self Care Home Management;Electrical Stimulation;Ultrasound;Gait training;Stair training;Functional mobility training;Therapeutic activities;Therapeutic exercise;Patient/family education;Manual techniques;Passive range of motion;Scar mobilization;Taping    PT Next Visit Plan Progress HEP, progress strengthening and plyometrics as tolerated, dynamic balance    PT Home Exercise Plan [id number]    Consulted and Agree with Plan of Care Patient             Patient will benefit from skilled therapeutic intervention in order to improve the following deficits and impairments:  Abnormal gait, Difficulty walking, Decreased safety awareness, Decreased  activity tolerance, Decreased strength, Decreased mobility, Increased edema, Pain  Visit Diagnosis: Chronic pain of left knee  Stiffness of left knee, not elsewhere classified  Other abnormalities of gait and mobility  Rupture of anterior cruciate ligament of left knee, subsequent encounter     Problem List There are no problems to display for this patient.   Rosana Hoes, PT, DPT, LAT, ATC 10/31/21  9:20 AM Phone: [phone] Fax: [phone]   [facility] Outpatient Rehabilitation [facility] [address] [place], [place], [address] Phone: [phone]   Fax:  [phone]  Name: Sebastiana Wuest. Hoerner MRN: [id number] Date of Birth: 07-Aug-2001

## 2021-11-11 DIAGNOSIS — M25562 Pain in left knee: Secondary | ICD-10-CM | POA: Diagnosis not present

## 2021-11-12 ENCOUNTER — Ambulatory Visit: Payer: 59 | Attending: Orthopaedic Surgery | Admitting: Physical Therapy

## 2021-11-12 ENCOUNTER — Other Ambulatory Visit: Payer: Self-pay

## 2021-11-12 ENCOUNTER — Encounter: Payer: Self-pay | Admitting: Physical Therapy

## 2021-11-12 DIAGNOSIS — M25562 Pain in left knee: Secondary | ICD-10-CM | POA: Insufficient documentation

## 2021-11-12 DIAGNOSIS — R2689 Other abnormalities of gait and mobility: Secondary | ICD-10-CM | POA: Insufficient documentation

## 2021-11-12 DIAGNOSIS — S83512D Sprain of anterior cruciate ligament of left knee, subsequent encounter: Secondary | ICD-10-CM | POA: Insufficient documentation

## 2021-11-12 DIAGNOSIS — M25662 Stiffness of left knee, not elsewhere classified: Secondary | ICD-10-CM | POA: Diagnosis not present

## 2021-11-12 DIAGNOSIS — G8929 Other chronic pain: Secondary | ICD-10-CM | POA: Diagnosis not present

## 2021-11-12 NOTE — Therapy (Signed)
[facility], [place], [address] Phone: [phone]   Fax:  [phone]  Physical Therapy Treatment  Patient Details  Name: Cheryl Cole MRN: [id number] Date of Birth: 2001-01-03 Referring Provider (PT): Hiram Gash, MD   Encounter Date: 11/12/2021   PT End of Session - 11/12/21 0833     Visit Number 38    Number of Visits 50    Date for PT Re-Evaluation 12/03/21    Authorization Type [employer] [facility] MCD    Authorization Time Period 08/21/2021 - 10/24/2021    PT Start Time [id number]    PT Stop Time 0915    PT Time Calculation (min) 43 min    Activity Tolerance Patient tolerated treatment well    Behavior During Therapy [facility] for tasks assessed/performed             Past Medical History:  Diagnosis Date   Torn meniscus     Past Surgical History:  Procedure Laterality Date   KNEE ARTHROSCOPY WITH ANTERIOR CRUCIATE LIGAMENT (ACL) REPAIR Left 02/05/2021   Procedure: KNEE ARTHROSCOPY WITH ANTERIOR CRUCIATE LIGAMENT (ACL) REPAIR WITH AUTOGRAFT;  Surgeon: Hiram Gash, MD;  Location: [place];  Service: Orthopedics;  Laterality: Left;   KNEE ARTHROSCOPY WITH LATERAL MENISECTOMY  02/05/2021   Procedure: KNEE ARTHROSCOPY WITH LATERAL MENISECTOMY;  Surgeon: Hiram Gash, MD;  Location: [place];  Service: Orthopedics;;   KNEE ARTHROSCOPY WITH MEDIAL MENISECTOMY Left 02/05/2021   Procedure: KNEE ARTHROSCOPY WITH  MEDIAL MENISCUS REPAIR;  Surgeon: Hiram Gash, MD;  Location: [place];  Service: Orthopedics;  Laterality: Left;   NO PAST SURGERIES      There were no vitals filed for this visit.   Subjective Assessment - 11/12/21 0835     Subjective Patient reports she is doing well. She has been consistent with her exercises. Denies any pain exercise or activity. She states she has been doing more strengthening but hasn't been doing as much running.     Patient Stated Goals Get back to previous level of activity    Currently in Pain? No/denies                [facility] PT Assessment - 11/12/21 0001       Strength   Left Knee Flexion 4+/5    Left Knee Extension 4+/5                 [facility] Adult PT Treatment/Exercise:   Therapeutic Exercise: Dynamic warm-up: run at 50%-75%-90%, sprint with focus on deceleration mechanics Vertical jumping with focus on quick load and explosive movement 3 x 8 - using mirror for visual feedback   SL rear foot elevated jumps using TRX with focusing on quick load and explosive movement 2 x 10 Lateral skater jump 2 x 5   Split lunge with 15# at chest 3 x 10 - focus on quicker motion and using FR at front toe and weight under toe to encourage forward knee translation while keeping heel down Standing hydrant with blue 3 x 15   Leg press (cybex) SL 100# 3 x 6, eccentric on left 140# 2 x 5 Knee extension machine 20# on left 2 x 6 eccentric on left 35# 2 x 5                   PT Education - 11/12/21 0837     Education Details HEP, increasing speed  with running    Person(s) Educated Patient    Methods Explanation;Demonstration;Verbal cues    Comprehension Verbalized understanding;Returned demonstration;Verbal cues required;Need further instruction              PT Short Term Goals - 03/26/21 1053       PT SHORT TERM GOAL #1   Title Patient will increase knee flexion to 90 degrees    Baseline 120 - 03/26/21    Time 4    Period Weeks    Status Achieved    Target Date 03/07/21      PT SHORT TERM GOAL #2   Title Patient will demonstrate full extension    Baseline 0 deg knee extension AROM - 03/26/21    Time 4    Period Weeks    Status Achieved    Target Date 03/07/21      PT SHORT TERM GOAL #3   Title Patient will ambualte 300' without a device    Baseline no longer using AD with ambulation - 03/26/21    Time 4    Period Weeks    Status Achieved    Target Date  03/07/21               PT Long Term Goals - 11/12/21 0907       PT LONG TERM GOAL #1   Title Patient will be indeepdent with exercise program to promote quad strength and single leg stability    Baseline HEP continues to be updated for strengthening and plyometric progression, as well as home running progression    Time 8    Period Weeks    Status On-going    Target Date 12/03/21      PT LONG TERM GOAL #2   Title Patient will demonstrate a 30 second left single leg stance in order to progress to dynamic single leg strength and stability    Baseline Patient able to maintain SLS > 30 sec bilaterally - 03/26/21    Time 6    Period Weeks    Status Achieved      PT LONG TERM GOAL #3   Title Patient will demonstrate 5/5 MMT of left knee in order to normalize running and landing mechanics    Baseline Strength grossly 4+/5 MMT left knee, continues to exhibit strength deficit compared to opposite side    Time 8    Period Weeks    Status On-going    Target Date 12/03/21      PT LONG TERM GOAL #4   Title Patient will demonstrate >/= 4+/5 MMT hip strength to improve dynamic knee control with landing while running or jumping    Baseline hip strength grossly 4/5 MMT bilaterally    Time 8    Period Weeks    Status On-going    Target Date 12/03/21      PT LONG TERM GOAL #5   Title Patient will be able to perform single leg squat x10 without deviation to demonstrate appropriate motor control    Baseline patient able to perform 10 reps SLS to 22" surface without deviation    Time 8    Period Weeks    Status Achieved      PT LONG TERM GOAL #6   Title Patient will report no increased pain or swelling with PT related activity or exercise in order to progress strength and to dynamic plyometric tasks    Baseline Patient denies any soreness or swelling following PT or activity at this  time - 04/28/21    Time 6    Period Weeks    Status Achieved      PT LONG TERM GOAL #7   Title  Patient will be able to run at self selected speed and without limitation or increase in pain in order to return to active lifestyle and exercise    Baseline Patient is progressing well with running progression, she continues to demonstrate deviations with stiff landing on left and is running at slower pace for decreased time    Time 8    Period Weeks    Status On-going    Target Date 12/03/21      PT LONG TERM GOAL #8   Title Patient will report >/= 79/80 on LEFS to indicate return to prior level of functional activity    Baseline LEFS 74/80, continues to have deficit with running/cutting/jumping related tasks    Time 8    Period Weeks    Status On-going    Target Date 12/03/21                   Plan - 11/12/21 0845     Clinical Impression Statement Patient tolerated therapy well with no adverse effects. Therapy continues ot focus on progressing patient's strength and jump/landing ability. Patient continues to exhibits difficulty with forard knee translation while maintaining a flat foot so placed foot in slight dorsiflexion and used FR to emphasize forward knee translation. She continues to demonstrate gross strength deficit on the left with difficulty performing explosive movements and controlling landing. Patient would benefit from continued skilled PT to progress her strength and plyometric control in order to return to activities such as running and volleyball without limitation and with reduced risk of reinjury.    PT Treatment/Interventions ADLs/Self Care Home Management;Electrical Stimulation;Ultrasound;Gait training;Stair training;Functional mobility training;Therapeutic activities;Therapeutic exercise;Patient/family education;Manual techniques;Passive range of motion;Scar mobilization;Taping    PT Next Visit Plan Progress HEP, progress strengthening and plyometrics as tolerated, dynamic balance    PT Home Exercise Plan [id number]    Consulted and Agree with Plan of Care  Patient             Patient will benefit from skilled therapeutic intervention in order to improve the following deficits and impairments:  Abnormal gait, Difficulty walking, Decreased safety awareness, Decreased activity tolerance, Decreased strength, Decreased mobility, Increased edema, Pain  Visit Diagnosis: Chronic pain of left knee  Stiffness of left knee, not elsewhere classified  Other abnormalities of gait and mobility  Rupture of anterior cruciate ligament of left knee, subsequent encounter     Problem List There are no problems to display for this patient.   Hilda Blades, PT, DPT, LAT, ATC 11/12/21  9:15 AM Phone: [phone] Fax: [facility] [address] [place], [place], [address] Phone: [phone]   Fax:  [phone]  Name: Cheryl Cole MRN: [id number] Date of Birth: 2001-09-05

## 2021-11-17 DIAGNOSIS — S83232D Complex tear of medial meniscus, current injury, left knee, subsequent encounter: Secondary | ICD-10-CM | POA: Diagnosis not present

## 2021-11-17 DIAGNOSIS — S83512D Sprain of anterior cruciate ligament of left knee, subsequent encounter: Secondary | ICD-10-CM | POA: Diagnosis not present

## 2021-11-17 DIAGNOSIS — M6281 Muscle weakness (generalized): Secondary | ICD-10-CM | POA: Diagnosis not present

## 2021-11-26 ENCOUNTER — Ambulatory Visit: Payer: 59 | Admitting: Physical Therapy

## 2021-11-26 ENCOUNTER — Other Ambulatory Visit: Payer: Self-pay

## 2021-11-26 ENCOUNTER — Encounter: Payer: Self-pay | Admitting: Physical Therapy

## 2021-11-26 DIAGNOSIS — R2689 Other abnormalities of gait and mobility: Secondary | ICD-10-CM

## 2021-11-26 DIAGNOSIS — M25562 Pain in left knee: Secondary | ICD-10-CM | POA: Diagnosis not present

## 2021-11-26 DIAGNOSIS — G8929 Other chronic pain: Secondary | ICD-10-CM | POA: Diagnosis not present

## 2021-11-26 DIAGNOSIS — M25662 Stiffness of left knee, not elsewhere classified: Secondary | ICD-10-CM

## 2021-11-26 DIAGNOSIS — S83512D Sprain of anterior cruciate ligament of left knee, subsequent encounter: Secondary | ICD-10-CM

## 2021-11-26 NOTE — Therapy (Signed)
[facility] Outpatient Rehabilitation [facility] [address] [place], [place], [address] Phone: [phone]   Fax:  [phone]  Physical Therapy Treatment / ERO  Patient Details  Name: Cheryl Cole MRN: [id number] Date of Birth: 11-03-2000 Referring Provider (PT): Bjorn Pippin, MD   Encounter Date: 11/26/2021   PT End of Session - 11/26/21 0828     Visit Number 48    Number of Visits 52    Date for PT Re-Evaluation 01/21/22    Authorization Type [employer] [facility]    PT Start Time [phone]    PT Stop Time 0915    PT Time Calculation (min) 49 min    Activity Tolerance Patient tolerated treatment well    Behavior During Therapy [facility] for tasks assessed/performed             Past Medical History:  Diagnosis Date   Torn meniscus     Past Surgical History:  Procedure Laterality Date   KNEE ARTHROSCOPY WITH ANTERIOR CRUCIATE LIGAMENT (ACL) REPAIR Left 02/05/2021   Procedure: KNEE ARTHROSCOPY WITH ANTERIOR CRUCIATE LIGAMENT (ACL) REPAIR WITH AUTOGRAFT;  Surgeon: Bjorn Pippin, MD;  Location: [facility];  Service: Orthopedics;  Laterality: Left;   KNEE ARTHROSCOPY WITH LATERAL MENISECTOMY  02/05/2021   Procedure: KNEE ARTHROSCOPY WITH LATERAL MENISECTOMY;  Surgeon: Bjorn Pippin, MD;  Location: [facility];  Service: Orthopedics;;   KNEE ARTHROSCOPY WITH MEDIAL MENISECTOMY Left 02/05/2021   Procedure: KNEE ARTHROSCOPY WITH  MEDIAL MENISCUS REPAIR;  Surgeon: Bjorn Pippin, MD;  Location: [facility];  Service: Orthopedics;  Laterality: Left;   NO PAST SURGERIES      There were no vitals filed for this visit.   Subjective Assessment - 11/26/21 0835     Subjective Patient reports she is doing well, she continues to feel improvement with her strength and jumping. She did do some testing with the surgeon and was told she was doing well with strength but still needed to work on knee control with jump landing.    Patient  Stated Goals Get back to previous level of activity    Currently in Pain? No/denies                [facility] PT Assessment - 11/26/21 0001       Assessment   Medical Diagnosis L ACL with BTB graft and Medial meniscal reapair    Referring Provider (PT) Bjorn Pippin, MD    Onset Date/Surgical Date 02/05/21      Precautions   Precautions None      Restrictions   Weight Bearing Restrictions No      Balance Screen   Has the patient fallen in the past 6 months No      Prior Function   Level of Independence Independent      Observation/Other Assessments   Focus on Therapeutic Outcomes (FOTO)  NA - MCD    Lower Extremity Functional Scale  76      Hopping   Comments SL stationary hop demonstrates decreased quad control and endurance with difficulty performing consecutive hops due to stiffer landing      Jumping   Comments SL jumping with decreased height and distance and stiffer landing on left with decreased knee flexion and increased forward trunk lean      Running   Comments Patient continues to demonstrate slight decreased knee flexion in stance, indicating decreased quad control with stiffer landing  AROM   Overall AROM Comments Knee AROM grossly WFL and non-painful, equal bilaterally      Strength   Right Hip ABduction 4/5    Left Hip ABduction 4/5    Left Knee Flexion 4+/5    Left Knee Extension 4+/5                  [facility] Adult PT Treatment/Exercise:  Therapeutic Exercise: Dynamic warm-up: 50% job x 6 lengths Build-up runs 50-75% x 6 lengths Tall lean into spring x 6 lengths Side shuffle x 1 length each [place]oka x 1 length each High skip x 2 lengths Wall triple extension 2 x 10 each SL squat on BOSU 3 x 8 - tactile cue for knee to bend pat toes SL alternating forward jumps x 4 lengths Reverse deficit lunge with 6" box x 8, with 15# at chest x 8 SL 90 deg hop turns 2 x 1 full turn each direction Lateral skater hops 2 x 10 Leg press (cybex)  SL 100# 3 x 6       PT Education - 11/26/21 0839     Education Details POC udpate, HEP, continued progression of speed training/sprinting/change of direction    Person(s) Educated Patient    Methods Explanation;Demonstration;Verbal cues;Handout    Comprehension Verbalized understanding;Returned demonstration;Verbal cues required;Need further instruction              PT Short Term Goals - 03/26/21 1053       PT SHORT TERM GOAL #1   Title Patient will increase knee flexion to 90 degrees    Baseline 120 - 03/26/21    Time 4    Period Weeks    Status Achieved    Target Date 03/07/21      PT SHORT TERM GOAL #2   Title Patient will demonstrate full extension    Baseline 0 deg knee extension AROM - 03/26/21    Time 4    Period Weeks    Status Achieved    Target Date 03/07/21      PT SHORT TERM GOAL #3   Title Patient will ambualte 300' without a device    Baseline no longer using AD with ambulation - 03/26/21    Time 4    Period Weeks    Status Achieved    Target Date 03/07/21               PT Long Term Goals - 11/26/21 1019       PT LONG TERM GOAL #1   Title Patient will be indeepdent with exercise program to promote quad strength and single leg stability    Baseline HEP continues to be updated for strengthening and plyometric progression, as well as home running progression    Time 8    Period Weeks    Status On-going    Target Date 01/21/22      PT LONG TERM GOAL #2   Title Patient will demonstrate a 30 second left single leg stance in order to progress to dynamic single leg strength and stability    Baseline Patient able to maintain SLS > 30 sec bilaterally - 03/26/21    Time 6    Period Weeks    Status Achieved      PT LONG TERM GOAL #3   Title Patient will demonstrate 5/5 MMT of left knee in order to normalize running and landing mechanics    Baseline Strength grossly 4+/5 MMT left knee, continues to exhibit strength deficit  compared to opposite  side    Time 8    Period Weeks    Status On-going    Target Date 01/21/22      PT LONG TERM GOAL #4   Title Patient will demonstrate >/= 4+/5 MMT hip strength to improve dynamic knee control with landing while running or jumping    Baseline hip strength grossly 4/5 MMT bilaterally    Time 8    Period Weeks    Status On-going    Target Date 01/21/22      PT LONG TERM GOAL #5   Title Patient will be able to perform single leg squat x10 without deviation to demonstrate appropriate motor control    Baseline patient able to perform 10 reps SLS to 22" surface without deviation    Time 8    Period Weeks    Status Achieved      PT LONG TERM GOAL #6   Title Patient will report no increased pain or swelling with PT related activity or exercise in order to progress strength and to dynamic plyometric tasks    Baseline Patient denies any soreness or swelling following PT or activity at this time - 04/28/21    Time 6    Period Weeks    Status Achieved      PT LONG TERM GOAL #7   Title Patient will be able to run at self selected speed and without limitation or increase in pain in order to return to active lifestyle and exercise    Baseline Patient is progressing well with running and change of direction program    Time 8    Period Weeks    Status On-going    Target Date 01/21/22      PT LONG TERM GOAL #8   Title Patient will report >/= 79/80 on LEFS to indicate return to prior level of functional activity    Baseline LEFS 76/80, continues to have deficit with running/cutting/jumping related tasks    Time 8    Period Weeks    Status On-going    Target Date 01/21/22                   Plan - 11/26/21 0839     Clinical Impression Statement Patient tolerated therapy well with no adverse effects. Patient is 9 months and 2 weeks post-op ACL repair on left. She does seem to be progressing with her left knee control with jump/landing plyometric tasks, but continues to exhibit gross  strength and endurance deficit leading to overall stiffer landings and less power on the left. She reports gradual improvement with functional ability on LEFS, and she is progressing through her running/change of direction program at home. Therapy continues to focus on progressing her strength and quad control, plyometric ability, and dynamic balance and control.  Patient would benefit from continued skilled PT in order to return to activities such as running and volleyball without limitation and with reduced risk of reinjury, so will extend plan of care for 8 more weeks at a frquency of every other week.    Examination-Activity Limitations Locomotion Level    Examination-Participation Restrictions Community Activity    Rehab Potential Good    PT Frequency Biweekly    PT Duration 8 weeks    PT Treatment/Interventions ADLs/Self Care Home Management;Functional mobility training;Therapeutic activities;Therapeutic exercise;Patient/family education;Manual techniques;Passive range of motion;Taping;Dry needling;Joint Manipulations;Balance training;Neuromuscular re-education    PT Next Visit Plan Progress HEP, progress strengthening and plyometrics as tolerated, dynamic balance  PT Home Exercise Plan [id number]    Consulted and Agree with Plan of Care Patient             Patient will benefit from skilled therapeutic intervention in order to improve the following deficits and impairments:  Decreased activity tolerance, Decreased strength, Pain  Visit Diagnosis: Chronic pain of left knee  Stiffness of left knee, not elsewhere classified  Other abnormalities of gait and mobility  Rupture of anterior cruciate ligament of left knee, subsequent encounter     Problem List There are no problems to display for this patient.   Rosana Hoes, PT, DPT, LAT, ATC 11/26/21  10:24 AM Phone: [phone] Fax: [phone]   [facility] Outpatient Rehabilitation [facility] [address] [place], [place], [address] Phone: [phone]   Fax:  [phone]  Name: Cheryl Cole MRN: [id number] Date of Birth: [birth year]

## 2021-12-10 ENCOUNTER — Ambulatory Visit: Payer: 59 | Attending: Orthopaedic Surgery | Admitting: Physical Therapy

## 2021-12-10 ENCOUNTER — Other Ambulatory Visit: Payer: Self-pay

## 2021-12-10 ENCOUNTER — Encounter: Payer: Self-pay | Admitting: Physical Therapy

## 2021-12-10 DIAGNOSIS — M25662 Stiffness of left knee, not elsewhere classified: Secondary | ICD-10-CM | POA: Diagnosis not present

## 2021-12-10 DIAGNOSIS — G8929 Other chronic pain: Secondary | ICD-10-CM | POA: Insufficient documentation

## 2021-12-10 DIAGNOSIS — R2689 Other abnormalities of gait and mobility: Secondary | ICD-10-CM | POA: Diagnosis not present

## 2021-12-10 DIAGNOSIS — S83512D Sprain of anterior cruciate ligament of left knee, subsequent encounter: Secondary | ICD-10-CM | POA: Insufficient documentation

## 2021-12-10 DIAGNOSIS — M25562 Pain in left knee: Secondary | ICD-10-CM | POA: Diagnosis not present

## 2021-12-10 NOTE — Therapy (Signed)
[facility] Outpatient Rehabilitation [facility] [address], [place], [address] Phone: [phone]   Fax:  [phone]  Physical Therapy Treatment  Patient Details  Name: Cheryl Cole. Dopson MRN: [id number] Date of Birth: 11-13-2000 Referring Provider (PT): Bjorn Pippin, MD   Encounter Date: 12/10/2021   PT End of Session - 12/10/21 0828     Visit Number 49    Number of Visits 52    Date for PT Re-Evaluation 01/21/22    Authorization Type [employer] [facility]    PT Start Time [phone]    PT Stop Time 0915    PT Time Calculation (min) 49 min    Activity Tolerance Patient tolerated treatment well    Behavior During Therapy [facility] for tasks assessed/performed             Past Medical History:  Diagnosis Date   Torn meniscus     Past Surgical History:  Procedure Laterality Date   KNEE ARTHROSCOPY WITH ANTERIOR CRUCIATE LIGAMENT (ACL) REPAIR Left 02/05/2021   Procedure: KNEE ARTHROSCOPY WITH ANTERIOR CRUCIATE LIGAMENT (ACL) REPAIR WITH AUTOGRAFT;  Surgeon: Bjorn Pippin, MD;  Location: [facility];  Service: Orthopedics;  Laterality: Left;   KNEE ARTHROSCOPY WITH LATERAL MENISECTOMY  02/05/2021   Procedure: KNEE ARTHROSCOPY WITH LATERAL MENISECTOMY;  Surgeon: Bjorn Pippin, MD;  Location: [facility];  Service: Orthopedics;;   KNEE ARTHROSCOPY WITH MEDIAL MENISECTOMY Left 02/05/2021   Procedure: KNEE ARTHROSCOPY WITH  MEDIAL MENISCUS REPAIR;  Surgeon: Bjorn Pippin, MD;  Location: [facility];  Service: Orthopedics;  Laterality: Left;   NO PAST SURGERIES      There were no vitals filed for this visit.   Subjective Assessment - 12/10/21 0829     Subjective Patient reports she has been working on her pivoting and going to the gym. She does feel a littl worn out today due to not taking a day off.    Patient Stated Goals Get back to previous level of activity    Currently in Pain? No/denies                [facility] PT  Assessment - 12/10/21 0001       Running   Comments Difficulty with deceleration      Strength   Left Knee Extension 4+/5                 [facility] Adult PT Treatment/Exercise:  Therapeutic Exercise: Dynamic warm up / plyometrics: 50% jog x 6 lengths, 75% run x 6 lengths, sprint x 6 lengths Lateral shuffle with flip x 4 lengths High skip x 4 lengths Max vertical jump 2 x 5 consecutive High knee jump x 5 consecutive Lateral skater jump 2 x 5 consecutive Triple hop x 5 each Hurdle jump 2 x 5 Eccentric forward walk with banded resistance 2 x 2 lengths Sided shuffle with banded resistance 2 x 4 lengths Eccentric SL squat on slant board with TRX support 3 x 6 Hamstring curl machine 45# x 8, 55# 2 x 8 SL stance on BOSU with kettlebell circles around waist 2 x 10 cw/ccw SL knee extension machine 35# 3 x 6           PT Education - 12/10/21 0831     Education Details Taking days off for LE strengthening    Person(s) Educated Patient    Methods Explanation;Demonstration;Verbal cues    Comprehension Verbalized understanding;Returned demonstration;Verbal cues required;Need further instruction  PT Short Term Goals - 03/26/21 1053       PT SHORT TERM GOAL #1   Title Patient will increase knee flexion to 90 degrees    Baseline 120 - 03/26/21    Time 4    Period Weeks    Status Achieved    Target Date 03/07/21      PT SHORT TERM GOAL #2   Title Patient will demonstrate full extension    Baseline 0 deg knee extension AROM - 03/26/21    Time 4    Period Weeks    Status Achieved    Target Date 03/07/21      PT SHORT TERM GOAL #3   Title Patient will ambualte 300' without a device    Baseline no longer using AD with ambulation - 03/26/21    Time 4    Period Weeks    Status Achieved    Target Date 03/07/21               PT Long Term Goals - 11/26/21 1019       PT LONG TERM GOAL #1   Title Patient will be indeepdent with exercise program  to promote quad strength and single leg stability    Baseline HEP continues to be updated for strengthening and plyometric progression, as well as home running progression    Time 8    Period Weeks    Status On-going    Target Date 01/21/22      PT LONG TERM GOAL #2   Title Patient will demonstrate a 30 second left single leg stance in order to progress to dynamic single leg strength and stability    Baseline Patient able to maintain SLS > 30 sec bilaterally - 03/26/21    Time 6    Period Weeks    Status Achieved      PT LONG TERM GOAL #3   Title Patient will demonstrate 5/5 MMT of left knee in order to normalize running and landing mechanics    Baseline Strength grossly 4+/5 MMT left knee, continues to exhibit strength deficit compared to opposite side    Time 8    Period Weeks    Status On-going    Target Date 01/21/22      PT LONG TERM GOAL #4   Title Patient will demonstrate >/= 4+/5 MMT hip strength to improve dynamic knee control with landing while running or jumping    Baseline hip strength grossly 4/5 MMT bilaterally    Time 8    Period Weeks    Status On-going    Target Date 01/21/22      PT LONG TERM GOAL #5   Title Patient will be able to perform single leg squat x10 without deviation to demonstrate appropriate motor control    Baseline patient able to perform 10 reps SLS to 22" surface without deviation    Time 8    Period Weeks    Status Achieved      PT LONG TERM GOAL #6   Title Patient will report no increased pain or swelling with PT related activity or exercise in order to progress strength and to dynamic plyometric tasks    Baseline Patient denies any soreness or swelling following PT or activity at this time - 04/28/21    Time 6    Period Weeks    Status Achieved      PT LONG TERM GOAL #7   Title Patient will be able to run at  self selected speed and without limitation or increase in pain in order to return to active lifestyle and exercise    Baseline  Patient is progressing well with running and change of direction program    Time 8    Period Weeks    Status On-going    Target Date 01/21/22      PT LONG TERM GOAL #8   Title Patient will report >/= 79/80 on LEFS to indicate return to prior level of functional activity    Baseline LEFS 76/80, continues to have deficit with running/cutting/jumping related tasks    Time 8    Period Weeks    Status On-going    Target Date 01/21/22                   Plan - 12/10/21 6063     Clinical Impression Statement Patient tolerated therapy well with no adverse effects. She is progressing well with her running and jumping tasks, but does exhibit limitations with deceleration and single leg landing as result of defict with eccentric quad strength/control. Therapy continues to focus on prorgessing strength and emphasized eccentric quad control this visit. Patient would benefit from continued skilled PT in order to return to activities such as running and volleyball without limitation and with reduced risk of reinjury.    PT Treatment/Interventions ADLs/Self Care Home Management;Functional mobility training;Therapeutic activities;Therapeutic exercise;Patient/family education;Manual techniques;Passive range of motion;Taping;Dry needling;Joint Manipulations;Balance training;Neuromuscular re-education    PT Next Visit Plan Progress HEP, progress strengthening and plyometrics as tolerated, dynamic balance    PT Home Exercise Plan [id number]    Consulted and Agree with Plan of Care Patient             Patient will benefit from skilled therapeutic intervention in order to improve the following deficits and impairments:  Decreased activity tolerance, Decreased strength, Pain  Visit Diagnosis: Chronic pain of left knee  Stiffness of left knee, not elsewhere classified  Other abnormalities of gait and mobility  Rupture of anterior cruciate ligament of left knee, subsequent  encounter     Problem List There are no problems to display for this patient.   Rosana Hoes, PT, DPT, LAT, ATC 12/10/21  9:17 AM Phone: [phone] Fax: [phone]   [facility] Outpatient Rehabilitation [facility] [address] [place], [place], [address] Phone: [phone]   Fax:  [phone]  Name: Marijose Curington. Bodi MRN: [id number] Date of Birth: Jul 22, 2001

## 2021-12-24 ENCOUNTER — Encounter: Payer: Self-pay | Admitting: Physical Therapy

## 2021-12-24 ENCOUNTER — Ambulatory Visit: Payer: 59 | Admitting: Physical Therapy

## 2021-12-24 ENCOUNTER — Other Ambulatory Visit: Payer: Self-pay

## 2021-12-24 DIAGNOSIS — M25562 Pain in left knee: Secondary | ICD-10-CM | POA: Diagnosis not present

## 2021-12-24 DIAGNOSIS — S83512D Sprain of anterior cruciate ligament of left knee, subsequent encounter: Secondary | ICD-10-CM

## 2021-12-24 DIAGNOSIS — G8929 Other chronic pain: Secondary | ICD-10-CM

## 2021-12-24 DIAGNOSIS — R2689 Other abnormalities of gait and mobility: Secondary | ICD-10-CM | POA: Diagnosis not present

## 2021-12-24 DIAGNOSIS — M25662 Stiffness of left knee, not elsewhere classified: Secondary | ICD-10-CM | POA: Diagnosis not present

## 2021-12-24 NOTE — Therapy (Signed)
[facility] Outpatient Rehabilitation [facility] [address] [place], [place], [address] Phone: [phone]   Fax:  [phone]  Physical Therapy Treatment  Patient Details  Name: Cheryl Cole. Signer MRN: [id number] Date of Birth: [birth year] Referring Provider (PT): Bjorn Pippin, MD   Encounter Date: 12/24/2021   PT End of Session - 12/24/21 0833     Visit Number 50    Number of Visits 52    Date for PT Re-Evaluation 01/21/22    Authorization Type [employer]    PT Start Time [phone]    PT Stop Time 0915    PT Time Calculation (min) 46 min    Activity Tolerance Patient tolerated treatment well    Behavior During Therapy [facility] for tasks assessed/performed             Past Medical History:  Diagnosis Date   Torn meniscus     Past Surgical History:  Procedure Laterality Date   KNEE ARTHROSCOPY WITH ANTERIOR CRUCIATE LIGAMENT (ACL) REPAIR Left 02/05/2021   Procedure: KNEE ARTHROSCOPY WITH ANTERIOR CRUCIATE LIGAMENT (ACL) REPAIR WITH AUTOGRAFT;  Surgeon: Bjorn Pippin, MD;  Location: [facility];  Service: Orthopedics;  Laterality: Left;   KNEE ARTHROSCOPY WITH LATERAL MENISECTOMY  02/05/2021   Procedure: KNEE ARTHROSCOPY WITH LATERAL MENISECTOMY;  Surgeon: Bjorn Pippin, MD;  Location: [facility];  Service: Orthopedics;;   KNEE ARTHROSCOPY WITH MEDIAL MENISECTOMY Left 02/05/2021   Procedure: KNEE ARTHROSCOPY WITH  MEDIAL MENISCUS REPAIR;  Surgeon: Bjorn Pippin, MD;  Location: [facility];  Service: Orthopedics;  Laterality: Left;   NO PAST SURGERIES      There were no vitals filed for this visit.   Subjective Assessment - 12/24/21 0858     Subjective Patient reports she has been working out at the gym more. She also reports she has been cleared by her doctor and states she passed the tests to return.    Patient Stated Goals Get back to previous level of activity    Currently in Pain? No/denies                 [facility] PT Assessment - 12/24/21 0001       Strength   Left Knee Extension 4+/5                [facility] Adult PT Treatment/Exercise:  Therapeutic Exercise: 50% jog x 3 lengths 75% jog x 3 lengths Build-up sprint x 3 lengths Short sprints with quick decel 2 x 5 Weaving run at 75% x 3 lengths Corner sprint x 2 each 45 deg cut zig zag run at 75% x 3 lengths Max vertical jump 3 x 5 Split jump with switch 2 x 6 Lateral skater jump 2 x 10 Leg press: DL: 326# x 8, 712# 2 x 8 SL: 100# x 6, 120# 2 x 6 (right 140# 3 x 6) Eccentric left: 140# 2 x 5          PT Education - 12/24/21 0859     Education Details Continued strengthening, incorporating more change of direction into running program    Person(s) Educated Patient    Methods Explanation;Demonstration;Verbal cues    Comprehension Returned demonstration;Verbalized understanding;Verbal cues required;Need further instruction              PT Short Term Goals - 03/26/21 1053       PT SHORT TERM GOAL #1   Title Patient will increase knee flexion to  90 degrees    Baseline 120 - 03/26/21    Time 4    Period Weeks    Status Achieved    Target Date 03/07/21      PT SHORT TERM GOAL #2   Title Patient will demonstrate full extension    Baseline 0 deg knee extension AROM - 03/26/21    Time 4    Period Weeks    Status Achieved    Target Date 03/07/21      PT SHORT TERM GOAL #3   Title Patient will ambualte 300' without a device    Baseline no longer using AD with ambulation - 03/26/21    Time 4    Period Weeks    Status Achieved    Target Date 03/07/21               PT Long Term Goals - 11/26/21 1019       PT LONG TERM GOAL #1   Title Patient will be indeepdent with exercise program to promote quad strength and single leg stability    Baseline HEP continues to be updated for strengthening and plyometric progression, as well as home running progression    Time 8    Period Weeks    Status  On-going    Target Date 01/21/22      PT LONG TERM GOAL #2   Title Patient will demonstrate a 30 second left single leg stance in order to progress to dynamic single leg strength and stability    Baseline Patient able to maintain SLS > 30 sec bilaterally - 03/26/21    Time 6    Period Weeks    Status Achieved      PT LONG TERM GOAL #3   Title Patient will demonstrate 5/5 MMT of left knee in order to normalize running and landing mechanics    Baseline Strength grossly 4+/5 MMT left knee, continues to exhibit strength deficit compared to opposite side    Time 8    Period Weeks    Status On-going    Target Date 01/21/22      PT LONG TERM GOAL #4   Title Patient will demonstrate >/= 4+/5 MMT hip strength to improve dynamic knee control with landing while running or jumping    Baseline hip strength grossly 4/5 MMT bilaterally    Time 8    Period Weeks    Status On-going    Target Date 01/21/22      PT LONG TERM GOAL #5   Title Patient will be able to perform single leg squat x10 without deviation to demonstrate appropriate motor control    Baseline patient able to perform 10 reps SLS to 22" surface without deviation    Time 8    Period Weeks    Status Achieved      PT LONG TERM GOAL #6   Title Patient will report no increased pain or swelling with PT related activity or exercise in order to progress strength and to dynamic plyometric tasks    Baseline Patient denies any soreness or swelling following PT or activity at this time - 04/28/21    Time 6    Period Weeks    Status Achieved      PT LONG TERM GOAL #7   Title Patient will be able to run at self selected speed and without limitation or increase in pain in order to return to active lifestyle and exercise    Baseline Patient is progressing well  with running and change of direction program    Time 8    Period Weeks    Status On-going    Target Date 01/21/22      PT LONG TERM GOAL #8   Title Patient will report >/= 79/80  on LEFS to indicate return to prior level of functional activity    Baseline LEFS 76/80, continues to have deficit with running/cutting/jumping related tasks    Time 8    Period Weeks    Status On-going    Target Date 01/21/22                   Plan - 12/24/21 0902     Clinical Impression Statement Patient tolerated therapy well with no adverse effects. Therapy focused on progression of plyometrics and running with change of directions. Patient continues to demonstrate difficulty with deceleration on the left and with hesitation for cutting on the left leg. She continues with strength deficit on the left likely leading to these deviations but overall continues to improve with her strength. Patient would benefit from continued skilled PT in order to return to activities such as running and volleyball without limitation and with reduced risk of reinjury.    PT Treatment/Interventions ADLs/Self Care Home Management;Functional mobility training;Therapeutic activities;Therapeutic exercise;Patient/family education;Manual techniques;Passive range of motion;Taping;Dry needling;Joint Manipulations;Balance training;Neuromuscular re-education    PT Next Visit Plan Progress HEP, progress strengthening and plyometrics as tolerated, dynamic balance    PT Home Exercise Plan [id number]    Consulted and Agree with Plan of Care Patient             Patient will benefit from skilled therapeutic intervention in order to improve the following deficits and impairments:  Decreased activity tolerance, Decreased strength, Pain  Visit Diagnosis: Chronic pain of left knee  Stiffness of left knee, not elsewhere classified  Other abnormalities of gait and mobility  Rupture of anterior cruciate ligament of left knee, subsequent encounter     Problem List There are no problems to display for this patient.   Rosana Hoes, PT, DPT, LAT, ATC 12/24/21  9:14 AM Phone: [phone] Fax:  [phone]   [facility] Outpatient Rehabilitation [facility] [address] [place], [place], [address] Phone: [phone]   Fax:  [phone]  Name: Cheryl Cole. Notaro MRN: [id number] Date of Birth: 10-02-2001

## 2022-01-07 ENCOUNTER — Other Ambulatory Visit: Payer: Self-pay

## 2022-01-07 ENCOUNTER — Ambulatory Visit: Payer: 59 | Attending: Orthopaedic Surgery | Admitting: Physical Therapy

## 2022-01-07 ENCOUNTER — Encounter: Payer: Self-pay | Admitting: Physical Therapy

## 2022-01-07 DIAGNOSIS — M25662 Stiffness of left knee, not elsewhere classified: Secondary | ICD-10-CM | POA: Diagnosis not present

## 2022-01-07 DIAGNOSIS — S83512D Sprain of anterior cruciate ligament of left knee, subsequent encounter: Secondary | ICD-10-CM | POA: Diagnosis not present

## 2022-01-07 DIAGNOSIS — G8929 Other chronic pain: Secondary | ICD-10-CM | POA: Insufficient documentation

## 2022-01-07 DIAGNOSIS — R2689 Other abnormalities of gait and mobility: Secondary | ICD-10-CM | POA: Insufficient documentation

## 2022-01-07 DIAGNOSIS — M25562 Pain in left knee: Secondary | ICD-10-CM | POA: Insufficient documentation

## 2022-01-07 NOTE — Therapy (Signed)
[place], [place], [address] Phone: [phone]   Fax:  [phone]  Physical Therapy Treatment  Patient Details  Name: Cheryl Cole MRN: [id number] Date of Birth: 2001-08-28 Referring Provider (PT): Hiram Gash, MD   Encounter Date: 01/07/2022   PT End of Session - 01/07/22 0837     Visit Number 96    Number of Visits 32    Date for PT Re-Evaluation 01/21/22    Authorization Type [employer] [facility] [employer]    PT Start Time 0830    PT Stop Time 0915    PT Time Calculation (min) 45 min    Activity Tolerance Patient tolerated treatment well    Behavior During Therapy [facility] for tasks assessed/performed             Past Medical History:  Diagnosis Date   Torn meniscus     Past Surgical History:  Procedure Laterality Date   KNEE ARTHROSCOPY WITH ANTERIOR CRUCIATE LIGAMENT (ACL) REPAIR Left 02/05/2021   Procedure: KNEE ARTHROSCOPY WITH ANTERIOR CRUCIATE LIGAMENT (ACL) REPAIR WITH AUTOGRAFT;  Surgeon: Hiram Gash, MD;  Location: [place];  Service: Orthopedics;  Laterality: Left;   KNEE ARTHROSCOPY WITH LATERAL MENISECTOMY  02/05/2021   Procedure: KNEE ARTHROSCOPY WITH LATERAL MENISECTOMY;  Surgeon: Hiram Gash, MD;  Location: [place];  Service: Orthopedics;;   KNEE ARTHROSCOPY WITH MEDIAL MENISECTOMY Left 02/05/2021   Procedure: KNEE ARTHROSCOPY WITH  MEDIAL MENISCUS REPAIR;  Surgeon: Hiram Gash, MD;  Location: [place];  Service: Orthopedics;  Laterality: Left;   NO PAST SURGERIES      There were no vitals filed for this visit.   Subjective Assessment - 01/07/22 0838     Subjective Patient reports she continues to do well and has been progressing at the gym.    Patient Stated Goals Get back to previous level of activity    Currently in Pain? No/denies                [facility] PT Assessment - 01/07/22 0001       Strength   Left Knee  Extension 4+/5                  [facility] Adult PT Treatment:                                                DATE: 01/07/2022 Therapeutic Exercise: 50% jog x 3 lengths 75% jog x 3 lengths Build-up sprint x 3 lengths DL broad jump x 1 length Alternating SL jump x 1 length SL broad jump 2 x 5 each SL Triple hop x 5 each DL forward triple hurdle jump x 5 lengths DL lateral triple hurdle jumps x 3 lengths SL single hurdle jump  2 x 5 Lateral skater hop 2 x 10 Knee extension ,machine SL: 35# 3 x 8 (right 55# 3 x 8) DL 75# 2 x 8 Leg press (cybex) SL: 120# x 6, 125# 2 x 5 Eccentric left: 145# 2 x 5   [facility] Adult PT Treatment:  DATE: 12/24/2021  Therapeutic Exercise: 50% jog x 3 lengths 75% jog x 3 lengths Build-up sprint x 3 lengths Short sprints with quick decel 2 x 5 Weaving run at 75% x 3 lengths Corner sprint x 2 each 45 deg cut zig zag run at 75% x 3 lengths Max vertical jump 3 x 5 Split jump with switch 2 x 6 Lateral skater jump 2 x 10 Leg press: DL: 200# x 8, 220# 2 x 8 SL: 100# x 6, 120# 2 x 6 (right 140# 3 x 6) Eccentric left: 140# 2 x 5          PT Education - 01/07/22 0839     Education Details Continued strengthening, incorporating more change of direction into running program    Person(s) Educated Patient    Methods Explanation;Demonstration;Verbal cues    Comprehension Verbalized understanding;Returned demonstration;Verbal cues required;Need further instruction              PT Short Term Goals - 03/26/21 1053       PT SHORT TERM GOAL #1   Title Patient will increase knee flexion to 90 degrees    Baseline 120 - 03/26/21    Time 4    Period Weeks    Status Achieved    Target Date 03/07/21      PT SHORT TERM GOAL #2   Title Patient will demonstrate full extension    Baseline 0 deg knee extension AROM - 03/26/21    Time 4    Period Weeks    Status Achieved    Target Date 03/07/21      PT  SHORT TERM GOAL #3   Title Patient will ambualte 300' without a device    Baseline no longer using AD with ambulation - 03/26/21    Time 4    Period Weeks    Status Achieved    Target Date 03/07/21               PT Long Term Goals - 11/26/21 1019       PT LONG TERM GOAL #1   Title Patient will be indeepdent with exercise program to promote quad strength and single leg stability    Baseline HEP continues to be updated for strengthening and plyometric progression, as well as home running progression    Time 8    Period Weeks    Status On-going    Target Date 01/21/22      PT LONG TERM GOAL #2   Title Patient will demonstrate a 30 second left single leg stance in order to progress to dynamic single leg strength and stability    Baseline Patient able to maintain SLS > 30 sec bilaterally - 03/26/21    Time 6    Period Weeks    Status Achieved      PT LONG TERM GOAL #3   Title Patient will demonstrate 5/5 MMT of left knee in order to normalize running and landing mechanics    Baseline Strength grossly 4+/5 MMT left knee, continues to exhibit strength deficit compared to opposite side    Time 8    Period Weeks    Status On-going    Target Date 01/21/22      PT LONG TERM GOAL #4   Title Patient will demonstrate >/= 4+/5 MMT hip strength to improve dynamic knee control with landing while running or jumping    Baseline hip strength grossly 4/5 MMT bilaterally    Time 8  Period Weeks    Status On-going    Target Date 01/21/22      PT LONG TERM GOAL #5   Title Patient will be able to perform single leg squat x10 without deviation to demonstrate appropriate motor control    Baseline patient able to perform 10 reps SLS to 22" surface without deviation    Time 8    Period Weeks    Status Achieved      PT LONG TERM GOAL #6   Title Patient will report no increased pain or swelling with PT related activity or exercise in order to progress strength and to dynamic plyometric  tasks    Baseline Patient denies any soreness or swelling following PT or activity at this time - 04/28/21    Time 6    Period Weeks    Status Achieved      PT LONG TERM GOAL #7   Title Patient will be able to run at self selected speed and without limitation or increase in pain in order to return to active lifestyle and exercise    Baseline Patient is progressing well with running and change of direction program    Time 8    Period Weeks    Status On-going    Target Date 01/21/22      PT LONG TERM GOAL #8   Title Patient will report >/= 79/80 on LEFS to indicate return to prior level of functional activity    Baseline LEFS 76/80, continues to have deficit with running/cutting/jumping related tasks    Time 8    Period Weeks    Status On-going    Target Date 01/21/22                   Plan - 01/07/22 0840     Clinical Impression Statement Patient tolerated therapy well with no adverse effects. Therapy focused on progressing plyometrcis and strength with good tolerance. Patient continues to demonstrate limitation with SL jump/landing and continuous jumping. This is likely due to continued strength deficit but she does seem to be improving with her left quad strength. Patient encouraged continued progression at gym and jumping, running, cutting ability. Patient would benefit from continued skilled PT in order to return to activities such as running and volleyball without limitation and with reduced risk of reinjury.    PT Treatment/Interventions ADLs/Self Care Home Management;Functional mobility training;Therapeutic activities;Therapeutic exercise;Patient/family education;Manual techniques;Passive range of motion;Taping;Dry needling;Joint Manipulations;Balance training;Neuromuscular re-education    PT Next Visit Plan Progress HEP, progress strengthening and plyometrics as tolerated, dynamic balance    PT Home Exercise Plan [id number]    Consulted and Agree with Plan of Care Patient              Patient will benefit from skilled therapeutic intervention in order to improve the following deficits and impairments:  Decreased activity tolerance, Decreased strength, Pain  Visit Diagnosis: Chronic pain of left knee  Stiffness of left knee, not elsewhere classified  Other abnormalities of gait and mobility  Rupture of anterior cruciate ligament of left knee, subsequent encounter     Problem List There are no problems to display for this patient.   Hilda Blades, PT, DPT, LAT, ATC 01/07/22  9:15 AM Phone: [phone] Fax: [facility] [address] [place], [place], [address] Phone: [phone]   Fax:  [phone]  Name: Cheryl Cole MRN: [id number] Date of Birth: 12-27-2000

## 2022-01-21 ENCOUNTER — Encounter: Payer: Self-pay | Admitting: Physical Therapy

## 2022-01-21 ENCOUNTER — Ambulatory Visit: Payer: 59 | Admitting: Physical Therapy

## 2022-01-21 ENCOUNTER — Other Ambulatory Visit: Payer: Self-pay

## 2022-01-21 DIAGNOSIS — S83512D Sprain of anterior cruciate ligament of left knee, subsequent encounter: Secondary | ICD-10-CM | POA: Diagnosis not present

## 2022-01-21 DIAGNOSIS — G8929 Other chronic pain: Secondary | ICD-10-CM

## 2022-01-21 DIAGNOSIS — M25662 Stiffness of left knee, not elsewhere classified: Secondary | ICD-10-CM | POA: Diagnosis not present

## 2022-01-21 DIAGNOSIS — R2689 Other abnormalities of gait and mobility: Secondary | ICD-10-CM

## 2022-01-21 DIAGNOSIS — M25562 Pain in left knee: Secondary | ICD-10-CM | POA: Diagnosis not present

## 2022-01-21 NOTE — Therapy (Signed)
[facility] ?[address] ?[place], [place], [address] ?Phone: [phone]   Fax:  [phone] ? ?Physical Therapy Treatment ? ?DISCHARGE ? ? ?Patient Details  ?Name: Cheryl Cole ?MRN: [id number] ?Date of Birth: 2000-12-14 ?Referring Provider (PT): Hiram Gash, MD ? ? ?Encounter Date: 01/21/2022 ? ? PT End of Session - 01/21/22 0817   ? ? Visit Number 12   ? Number of Visits 52   ? Date for PT Re-Evaluation 01/21/22   ? Authorization Type [employer]   ? PT Start Time 0825   ? PT Stop Time 0623   ? PT Time Calculation (min) 50 min   ? Activity Tolerance Patient tolerated treatment well   ? Behavior During Therapy [facility] for tasks assessed/performed   ? ?  ?  ? ?  ? ? ?Past Medical History:  ?Diagnosis Date  ? Torn meniscus   ? ? ?Past Surgical History:  ?Procedure Laterality Date  ? KNEE ARTHROSCOPY WITH ANTERIOR CRUCIATE LIGAMENT (ACL) REPAIR Left 02/05/2021  ? Procedure: KNEE ARTHROSCOPY WITH ANTERIOR CRUCIATE LIGAMENT (ACL) REPAIR WITH AUTOGRAFT;  Surgeon: Hiram Gash, MD;  Location: [place];  Service: Orthopedics;  Laterality: Left;  ? KNEE ARTHROSCOPY WITH LATERAL MENISECTOMY  02/05/2021  ? Procedure: KNEE ARTHROSCOPY WITH LATERAL MENISECTOMY;  Surgeon: Hiram Gash, MD;  Location: [place];  Service: Orthopedics;;  ? KNEE ARTHROSCOPY WITH MEDIAL MENISECTOMY Left 02/05/2021  ? Procedure: KNEE ARTHROSCOPY WITH  MEDIAL MENISCUS REPAIR;  Surgeon: Hiram Gash, MD;  Location: [place];  Service: Orthopedics;  Laterality: Left;  ? NO PAST SURGERIES    ? ? ?There were no vitals filed for this visit. ? ? Subjective Assessment - 01/21/22 0820   ? ? Subjective Patient reports she is doing well. She has been still working on her strengthening but states she needs to do more running.   ? Patient Stated Goals Get back to previous level of activity   ? Currently in Pain? No/denies   ? ?  ?  ? ?  ? ? ? ? ? [facility] PT  Assessment - 01/21/22 0001   ? ?  ? Assessment  ? Medical Diagnosis L ACL with BTB graft and Medial meniscal reapair   ? Referring Provider (PT) Hiram Gash, MD   ? Onset Date/Surgical Date 02/05/21   ?  ? Precautions  ? Precautions None   ?  ? Restrictions  ? Weight Bearing Restrictions No   ?  ? Balance Screen  ? Has the patient fallen in the past 6 months No   ?  ? Prior Function  ? Level of Independence Independent   ?  ? Observation/Other Assessments  ? Focus on Therapeutic Outcomes (FOTO)  NA - MCD   ? Lower Extremity Functional Scale  78/80   ?  ? Single Leg Squat  ? Comments WFL   ?  ? Hopping  ? Comments SL hop demonstrates deficit of quad strength and endurance with difficulty performing consecutive hops and with stiffer landing   ?  ? Running  ? Comments Difficulty with deceleration   ?  ? Single Leg Stance  ? Comments WFL   ?  ? PROM  ? Overall PROM Comments WFL   ?  ? Strength  ? Right Knee Flexion 5/5   ? Right Knee Extension 5/5   ? Left Knee Flexion 4+/5   ? Left Knee Extension 4+/5   ? ?  ?  ? ?  ? ? ?  JUMP TESTING: (best of 3) ?SL broard jump: 58% ?Right: 59.75" ?Left: 34.5 ?SL triple hop: 70% ?Right: 152.25" ?Left: 107.25" ? ? ? ?[facility] Adult PT Treatment:                                                DATE: 01/21/2022 ?Therapeutic Exercise: ?50% jog x 3 lengths ?75% jog x 3 lengths ?High knees, butt kicks, side shuffle, karaoke ?Build-up sprint x 3 lengths ?DL broad jump x 1 length ?Alternating SL jump x 1 length ?SL Triple hop x 5 each ?SL single hurdle jump  2 x 5 ?Lateral skater hop 2 x 10 ?Leg press (cybex) ?DL: 200# x 8, 240# 2 x 8 ?SL: 125# 3 x 6 (right 145#) ?Eccentric left: 145# 3 x 3 ?Deadlift barbbell 105# 3 x 6 ?Knee extension machine ?SL: 35# 3 x 8 (right 55#) ? ? ?[facility] Adult PT Treatment:                                                DATE: 01/07/2022 ?Therapeutic Exercise: ?50% jog x 3 lengths ?75% jog x 3 lengths ?Build-up sprint x 3 lengths ?DL broad jump x 1 length ?Alternating SL  jump x 1 length ?SL broad jump 2 x 5 each ?SL Triple hop x 5 each ?DL forward triple hurdle jump x 5 lengths ?DL lateral triple hurdle jumps x 3 lengths ?SL single hurdle jump  2 x 5 ?Lateral skater hop 2 x 10 ?Knee extension ,machine ?SL: 35# 3 x 8 (right 55# 3 x 8) ?DL 75# 2 x 8 ?Leg press (cybex) ?SL: 120# x 6, 125# 2 x 5 ?Eccentric left: 145# 2 x 5 ?  ?[facility] Adult PT Treatment:                                                DATE: 12/24/2021 ?Therapeutic Exercise: ?50% jog x 3 lengths ?75% jog x 3 lengths ?Build-up sprint x 3 lengths ?Short sprints with quick decel 2 x 5 ?Weaving run at 75% x 3 lengths ?Corner sprint x 2 each ?45 deg cut zig zag run at 75% x 3 lengths ?Max vertical jump 3 x 5 ?Split jump with switch 2 x 6 ?Lateral skater jump 2 x 10 ?Leg press: ?DL: 200# x 8, 220# 2 x 8 ?SL: 100# x 6, 120# 2 x 6 (right 140# 3 x 6) ?Eccentric left: 140# 2 x 5 ? ? ? ? ? ? ? PT Education - 01/21/22 0821   ? ? Education Details POC discharge, continued quad strengthening, methods for progression of jumping/running/cutting, returnt to sport as needed   ? Person(s) Educated Patient   ? Methods Explanation   ? Comprehension Verbalized understanding   ? ?  ?  ? ?  ? ? ? PT Short Term Goals - 03/26/21 1053   ? ?  ? PT SHORT TERM GOAL #1  ? Title Patient will increase knee flexion to 90 degrees   ? Baseline 120 - 03/26/21   ? Time 4   ? Period Weeks   ? Status  Achieved   ? Target Date 03/07/21   ?  ? PT SHORT TERM GOAL #2  ? Title Patient will demonstrate full extension   ? Baseline 0 deg knee extension AROM - 03/26/21   ? Time 4   ? Period Weeks   ? Status Achieved   ? Target Date 03/07/21   ?  ? PT SHORT TERM GOAL #3  ? Title Patient will ambualte 300' without a device   ? Baseline no longer using AD with ambulation - 03/26/21   ? Time 4   ? Period Weeks   ? Status Achieved   ? Target Date 03/07/21   ? ?  ?  ? ?  ? ? ? ? PT Long Term Goals - 01/21/22 0829   ? ?  ? PT LONG TERM GOAL #1  ? Title Patient will be indeepdent with  exercise program to promote quad strength and single leg stability   ? Baseline patient is independent with HEP and progressions   ? Time 8   ? Period Weeks   ? Status Achieved   ? Target Date 01/21/22   ?  ? PT LONG TERM GOAL #2  ? Title Patient will demonstrate a 30 second left single leg stance in order to progress to dynamic single leg strength and stability   ? Baseline Patient able to maintain SLS > 30 sec bilaterally - 03/26/21   ? Time 6   ? Period Weeks   ? Status Achieved   ?  ? PT LONG TERM GOAL #3  ? Title Patient will demonstrate 5/5 MMT of left knee in order to normalize running and landing mechanics   ? Baseline Strength grossly 4+/5 MMT left knee, continues to exhibit strength deficit compared to opposite side   ? Time 8   ? Period Weeks   ? Status Partially Met   ? Target Date 01/21/22   ?  ? PT LONG TERM GOAL #4  ? Title Patient will demonstrate >/= 4+/5 MMT hip strength to improve dynamic knee control with landing while running or jumping   ? Baseline hip strength grossly 4+/5 MMT bilaterally   ? Time 8   ? Period Weeks   ? Status Achieved   ? Target Date 01/21/22   ?  ? PT LONG TERM GOAL #5  ? Title Patient will be able to perform single leg squat x10 without deviation to demonstrate appropriate motor control   ? Baseline patient able to perform 10 reps SLS to 22" surface without deviation   ? Time 8   ? Period Weeks   ? Status Achieved   ?  ? PT LONG TERM GOAL #6  ? Title Patient will report no increased pain or swelling with PT related activity or exercise in order to progress strength and to dynamic plyometric tasks   ? Baseline Patient denies any soreness or swelling following PT or activity at this time - 04/28/21   ? Time 6   ? Period Weeks   ? Status Achieved   ?  ? PT LONG TERM GOAL #7  ? Title Patient will be able to run at self selected speed and without limitation or increase in pain in order to return to active lifestyle and exercise   ? Baseline Patient has no limitation with slower  speed running, continues to have deficits with high speed running and deceleration   ? Time 8   ? Period Weeks   ? Status Partially Met   ?  Target Date 01/21/22   ?  ? PT LONG TERM GOAL #8  ? Title Patien

## 2022-06-12 DIAGNOSIS — M25561 Pain in right knee: Secondary | ICD-10-CM | POA: Diagnosis not present

## 2022-06-12 DIAGNOSIS — S83232D Complex tear of medial meniscus, current injury, left knee, subsequent encounter: Secondary | ICD-10-CM | POA: Diagnosis not present

## 2022-06-13 DIAGNOSIS — M25561 Pain in right knee: Secondary | ICD-10-CM | POA: Diagnosis not present

## 2022-06-17 ENCOUNTER — Encounter (HOSPITAL_BASED_OUTPATIENT_CLINIC_OR_DEPARTMENT_OTHER): Payer: Self-pay | Admitting: Orthopaedic Surgery

## 2022-06-17 ENCOUNTER — Other Ambulatory Visit: Payer: Self-pay

## 2022-06-22 NOTE — Progress Notes (Signed)
Mother picked up CHG and Ensure presurgery. Instructions taped to bottles (drink ensure by 0600) and written instruction provided. Mom verbalized understanding.     Enhanced Recovery after Surgery for Orthopedics Enhanced Recovery after Surgery is a protocol used to improve the stress on your body and your recovery after surgery.  Patient Instructions  The night before surgery:  No food after midnight. ONLY clear liquids after midnight  The day of surgery (if you do NOT have diabetes):  Drink ONE (1) Pre-Surgery Clear Ensure as directed.   This drink was given to you during your hospital  pre-op appointment visit. The pre-op nurse will instruct you on the time to drink the  Pre-Surgery Ensure depending on your surgery time. Finish the drink at the designated time by the pre-op nurse.  Nothing else to drink after completing the  Pre-Surgery Clear Ensure.  The day of surgery (if you have diabetes): Drink ONE (1) Gatorade 2 (G2) as directed. This drink was given to you during your hospital  pre-op appointment visit.  The pre-op nurse will instruct you on the time to drink the   Gatorade 2 (G2) depending on your surgery time. Color of the Gatorade may vary. Red is not allowed. Nothing else to drink after completing the  Gatorade 2 (G2).         If you have questions, please contact your surgeon's office.

## 2022-06-24 ENCOUNTER — Encounter (HOSPITAL_BASED_OUTPATIENT_CLINIC_OR_DEPARTMENT_OTHER): Payer: Self-pay | Admitting: Orthopaedic Surgery

## 2022-06-25 ENCOUNTER — Encounter (HOSPITAL_BASED_OUTPATIENT_CLINIC_OR_DEPARTMENT_OTHER)
Admission: RE | Disposition: A | Discharge status: Home / Self Care | Payer: Self-pay | Source: Home / Self Care | Attending: Orthopaedic Surgery

## 2022-06-25 ENCOUNTER — Ambulatory Visit (HOSPITAL_BASED_OUTPATIENT_CLINIC_OR_DEPARTMENT_OTHER): Payer: 59 | Admitting: Certified Registered"

## 2022-06-25 ENCOUNTER — Encounter (HOSPITAL_BASED_OUTPATIENT_CLINIC_OR_DEPARTMENT_OTHER): Payer: Self-pay | Admitting: Orthopaedic Surgery

## 2022-06-25 ENCOUNTER — Other Ambulatory Visit: Payer: Self-pay

## 2022-06-25 ENCOUNTER — Ambulatory Visit (HOSPITAL_BASED_OUTPATIENT_CLINIC_OR_DEPARTMENT_OTHER)
Admission: RE | Admit: 2022-06-25 | Discharge: 2022-06-25 | Disposition: A | Discharge status: Home / Self Care | Payer: 59 | Attending: Orthopaedic Surgery | Admitting: Orthopaedic Surgery

## 2022-06-25 ENCOUNTER — Ambulatory Visit (HOSPITAL_COMMUNITY): Payer: 59

## 2022-06-25 DIAGNOSIS — Y9368 Activity, volleyball (beach) (court): Secondary | ICD-10-CM | POA: Diagnosis not present

## 2022-06-25 DIAGNOSIS — S83281A Other tear of lateral meniscus, current injury, right knee, initial encounter: Secondary | ICD-10-CM | POA: Diagnosis not present

## 2022-06-25 DIAGNOSIS — X58XXXA Exposure to other specified factors, initial encounter: Secondary | ICD-10-CM | POA: Insufficient documentation

## 2022-06-25 DIAGNOSIS — Z01818 Encounter for other preprocedural examination: Secondary | ICD-10-CM

## 2022-06-25 DIAGNOSIS — S83511A Sprain of anterior cruciate ligament of right knee, initial encounter: Secondary | ICD-10-CM | POA: Insufficient documentation

## 2022-06-25 DIAGNOSIS — S83241A Other tear of medial meniscus, current injury, right knee, initial encounter: Secondary | ICD-10-CM

## 2022-06-25 DIAGNOSIS — G8918 Other acute postprocedural pain: Secondary | ICD-10-CM | POA: Diagnosis not present

## 2022-06-25 HISTORY — PX: ANTERIOR CRUCIATE LIGAMENT REPAIR: SHX115

## 2022-06-25 HISTORY — PX: KNEE ARTHROSCOPY WITH MEDIAL MENISECTOMY: SHX5651

## 2022-06-25 LAB — POCT PREGNANCY, URINE: Preg Test, Ur: NEGATIVE

## 2022-06-25 SURGERY — ARTHROSCOPY, KNEE, WITH MEDIAL MENISCECTOMY
Anesthesia: Regional | Site: Knee | Laterality: Right

## 2022-06-25 MED ORDER — ONDANSETRON HCL 4 MG/2ML IJ SOLN: 4.0000 mg | Freq: Once | INTRAMUSCULAR | Status: DC | PRN

## 2022-06-25 MED ORDER — ROPIVACAINE HCL 7.5 MG/ML IJ SOLN
INTRAMUSCULAR | Status: DC | PRN
  Administered 2022-06-25: 30 mL via PERINEURAL

## 2022-06-25 MED ORDER — LACTATED RINGERS IV SOLN: INTRAVENOUS | Status: DC

## 2022-06-25 MED ORDER — LIDOCAINE HCL (CARDIAC) PF 100 MG/5ML IV SOSY
PREFILLED_SYRINGE | INTRAVENOUS | Status: DC | PRN
  Administered 2022-06-25: 40 mg via INTRAVENOUS

## 2022-06-25 MED ORDER — PHENYLEPHRINE HCL (PRESSORS) 10 MG/ML IV SOLN
INTRAVENOUS | Status: DC | PRN
  Administered 2022-06-25 (×3): 80 ug via INTRAVENOUS

## 2022-06-25 MED ORDER — METHOCARBAMOL 500 MG PO TABS: 500.0000 mg | ORAL_TABLET | Freq: Three times a day (TID) | ORAL | 0 refills | Status: AC | PRN

## 2022-06-25 MED ORDER — MIDAZOLAM HCL 2 MG/2ML IJ SOLN
2.0000 mg | Freq: Once | INTRAMUSCULAR | Status: AC
  Administered 2022-06-25: 2 mg via INTRAVENOUS

## 2022-06-25 MED ORDER — MEPERIDINE HCL 25 MG/ML IJ SOLN: 6.2500 mg | INTRAMUSCULAR | Status: DC | PRN

## 2022-06-25 MED ORDER — FENTANYL CITRATE (PF) 100 MCG/2ML IJ SOLN
INTRAMUSCULAR | Status: DC | PRN
  Administered 2022-06-25 (×2): 25 ug via INTRAVENOUS
  Administered 2022-06-25: 50 ug via INTRAVENOUS

## 2022-06-25 MED ORDER — ACETAMINOPHEN 325 MG PO TABS: 325.0000 mg | ORAL_TABLET | ORAL | Status: DC | PRN

## 2022-06-25 MED ORDER — ASPIRIN 81 MG PO CHEW: 81.0000 mg | CHEWABLE_TABLET | Freq: Two times a day (BID) | ORAL | 0 refills | Status: AC

## 2022-06-25 MED ORDER — ACETAMINOPHEN 500 MG PO TABS
1000.0000 mg | ORAL_TABLET | Freq: Once | ORAL | Status: AC
  Administered 2022-06-25: 1000 mg via ORAL

## 2022-06-25 MED ORDER — FENTANYL CITRATE (PF) 100 MCG/2ML IJ SOLN
INTRAMUSCULAR | Status: AC
  Filled 2022-06-25: qty 2

## 2022-06-25 MED ORDER — FENTANYL CITRATE (PF) 100 MCG/2ML IJ SOLN
100.0000 ug | Freq: Once | INTRAMUSCULAR | Status: AC
  Administered 2022-06-25: 100 ug via INTRAVENOUS

## 2022-06-25 MED ORDER — PHENYLEPHRINE 80 MCG/ML (10ML) SYRINGE FOR IV PUSH (FOR BLOOD PRESSURE SUPPORT)
PREFILLED_SYRINGE | INTRAVENOUS | Status: AC
  Filled 2022-06-25: qty 10

## 2022-06-25 MED ORDER — PROPOFOL 10 MG/ML IV BOLUS
INTRAVENOUS | Status: DC | PRN
  Administered 2022-06-25: 160 mg via INTRAVENOUS

## 2022-06-25 MED ORDER — FENTANYL CITRATE (PF) 100 MCG/2ML IJ SOLN
25.0000 ug | INTRAMUSCULAR | Status: DC | PRN
  Administered 2022-06-25 (×2): 50 ug via INTRAVENOUS

## 2022-06-25 MED ORDER — CEFAZOLIN SODIUM-DEXTROSE 2-4 GM/100ML-% IV SOLN
INTRAVENOUS | Status: AC
  Filled 2022-06-25: qty 100

## 2022-06-25 MED ORDER — DEXAMETHASONE SODIUM PHOSPHATE 10 MG/ML IJ SOLN
INTRAMUSCULAR | Status: DC | PRN
  Administered 2022-06-25: 10 mg via INTRAVENOUS

## 2022-06-25 MED ORDER — ONDANSETRON HCL 4 MG/2ML IJ SOLN
INTRAMUSCULAR | Status: DC | PRN
  Administered 2022-06-25: 4 mg via INTRAVENOUS

## 2022-06-25 MED ORDER — ONDANSETRON HCL 4 MG PO TABS: 4.0000 mg | ORAL_TABLET | Freq: Three times a day (TID) | ORAL | 0 refills | Status: AC | PRN

## 2022-06-25 MED ORDER — MIDAZOLAM HCL 2 MG/2ML IJ SOLN
INTRAMUSCULAR | Status: AC
  Filled 2022-06-25: qty 2

## 2022-06-25 MED ORDER — MELOXICAM 15 MG PO TABS: 15.0000 mg | ORAL_TABLET | Freq: Every day | ORAL | 0 refills | Status: AC

## 2022-06-25 MED ORDER — OXYCODONE HCL 5 MG PO TABS
ORAL_TABLET | ORAL | Status: AC
  Filled 2022-06-25: qty 1

## 2022-06-25 MED ORDER — SODIUM CHLORIDE 0.9 % IR SOLN
Status: DC | PRN
  Administered 2022-06-25 (×2): 3000 mL

## 2022-06-25 MED ORDER — KETOROLAC TROMETHAMINE 30 MG/ML IJ SOLN
INTRAMUSCULAR | Status: AC
  Filled 2022-06-25: qty 1

## 2022-06-25 MED ORDER — OXYCODONE HCL 5 MG PO TABS: ORAL_TABLET | ORAL | 0 refills | Status: AC

## 2022-06-25 MED ORDER — CLONIDINE HCL (ANALGESIA) 100 MCG/ML EP SOLN
EPIDURAL | Status: DC | PRN
  Administered 2022-06-25: 100 ug

## 2022-06-25 MED ORDER — OXYCODONE HCL 5 MG/5ML PO SOLN: 5.0000 mg | Freq: Once | ORAL | Status: AC | PRN

## 2022-06-25 MED ORDER — CEFAZOLIN SODIUM-DEXTROSE 2-4 GM/100ML-% IV SOLN
2.0000 g | INTRAVENOUS | Status: AC
  Administered 2022-06-25: 2 g via INTRAVENOUS

## 2022-06-25 MED ORDER — ACETAMINOPHEN 500 MG PO TABS
ORAL_TABLET | ORAL | Status: AC
  Filled 2022-06-25: qty 2

## 2022-06-25 MED ORDER — PROPOFOL 10 MG/ML IV BOLUS
INTRAVENOUS | Status: AC
  Filled 2022-06-25: qty 20

## 2022-06-25 MED ORDER — ACETAMINOPHEN 160 MG/5ML PO SOLN: 325.0000 mg | ORAL | Status: DC | PRN

## 2022-06-25 MED ORDER — ACETAMINOPHEN 500 MG PO TABS: 1000.0000 mg | ORAL_TABLET | Freq: Three times a day (TID) | ORAL | 0 refills | Status: AC

## 2022-06-25 MED ORDER — ONDANSETRON HCL 4 MG/2ML IJ SOLN
INTRAMUSCULAR | Status: AC
  Filled 2022-06-25: qty 2

## 2022-06-25 MED ORDER — OXYCODONE HCL 5 MG PO TABS
5.0000 mg | ORAL_TABLET | Freq: Once | ORAL | Status: AC | PRN
  Administered 2022-06-25: 5 mg via ORAL

## 2022-06-25 MED ORDER — LIDOCAINE 2% (20 MG/ML) 5 ML SYRINGE
INTRAMUSCULAR | Status: AC
  Filled 2022-06-25: qty 5

## 2022-06-25 MED ORDER — DEXAMETHASONE SODIUM PHOSPHATE 10 MG/ML IJ SOLN
INTRAMUSCULAR | Status: AC
  Filled 2022-06-25: qty 2

## 2022-06-25 MED ORDER — VANCOMYCIN HCL 1000 MG IV SOLR
INTRAVENOUS | Status: DC | PRN
  Administered 2022-06-25: 1000 mg via TOPICAL

## 2022-06-25 MED ORDER — KETOROLAC TROMETHAMINE 30 MG/ML IJ SOLN
INTRAMUSCULAR | Status: DC | PRN
  Administered 2022-06-25: 30 mg via INTRAVENOUS

## 2022-06-25 SURGICAL SUPPLY — 74 items
ANCH SUT 2-0 CVD FBRSTCH PLSTR (Anchor) ×2 IMPLANT
APL PRP STRL LF DISP 70% ISPRP (MISCELLANEOUS) ×1
BANDAGE ESMARK 6X9 LF (GAUZE/BANDAGES/DRESSINGS) IMPLANT
BLADE AVERAGE 25X9 (BLADE) ×1 IMPLANT
BLADE SHAVER BONE 5.0X13 (MISCELLANEOUS) ×1 IMPLANT
BLADE SURG 10 STRL SS (BLADE) ×1 IMPLANT
BLADE SURG 15 STRL LF DISP TIS (BLADE) ×1 IMPLANT
BLADE SURG 15 STRL SS (BLADE) ×1
BNDG CMPR 9X6 STRL LF SNTH (GAUZE/BANDAGES/DRESSINGS)
BNDG ELASTIC 6X5.8 VLCR STR LF (GAUZE/BANDAGES/DRESSINGS) ×1 IMPLANT
BNDG ESMARK 6X9 LF (GAUZE/BANDAGES/DRESSINGS)
BONE TUNNEL PLUG CANNULATED (MISCELLANEOUS) ×1 IMPLANT
BURR OVAL 8 FLU 4.0X13 (MISCELLANEOUS) IMPLANT
CHLORAPREP W/TINT 26 (MISCELLANEOUS) ×1 IMPLANT
CLSR STERI-STRIP ANTIMIC 1/2X4 (GAUZE/BANDAGES/DRESSINGS) ×1 IMPLANT
COLLECTOR GRAFT TISSUE (SYSTAGENIX WOUND MANAGEMENT) ×1
COOLER ICEMAN CLASSIC (MISCELLANEOUS) ×1 IMPLANT
COVER BACK TABLE 60X90IN (DRAPES) ×1 IMPLANT
CUFF TOURN SGL QUICK 34 (TOURNIQUET CUFF) ×1
CUFF TRNQT CYL 34X4.125X (TOURNIQUET CUFF) ×1 IMPLANT
DISSECTOR 3.5MM X 13CM CVD (MISCELLANEOUS) IMPLANT
DISSECTOR 4.0MMX13CM CVD (MISCELLANEOUS) ×1 IMPLANT
DRAPE ARTHROSCOPY W/POUCH 90 (DRAPES) ×1 IMPLANT
DRAPE IMP U-DRAPE 54X76 (DRAPES) ×1 IMPLANT
DRAPE U-SHAPE 47X51 STRL (DRAPES) ×1 IMPLANT
DRAPE-T ARTHROSCOPY W/POUCH (DRAPES) ×1 IMPLANT
ELECT REM PT RETURN 9FT ADLT (ELECTROSURGICAL) ×1
ELECTRODE REM PT RTRN 9FT ADLT (ELECTROSURGICAL) ×1 IMPLANT
GAUZE SPONGE 4X4 12PLY STRL (GAUZE/BANDAGES/DRESSINGS) ×2 IMPLANT
GLOVE BIO SURGEON STRL SZ 6.5 (GLOVE) ×1 IMPLANT
GLOVE BIOGEL PI IND STRL 6.5 (GLOVE) ×1 IMPLANT
GLOVE BIOGEL PI IND STRL 8 (GLOVE) ×1 IMPLANT
GLOVE BIOGEL PI INDICATOR 6.5 (GLOVE) ×1
GLOVE BIOGEL PI INDICATOR 8 (GLOVE) ×1
GLOVE ECLIPSE 8.0 STRL XLNG CF (GLOVE) ×2 IMPLANT
GOWN STRL REUS W/ TWL LRG LVL3 (GOWN DISPOSABLE) ×2 IMPLANT
GOWN STRL REUS W/TWL LRG LVL3 (GOWN DISPOSABLE) ×2
GOWN STRL REUS W/TWL XL LVL3 (GOWN DISPOSABLE) ×1 IMPLANT
IMMOBILIZER KNEE 22 UNIV (SOFTGOODS) IMPLANT
IMMOBILIZER KNEE 24 THIGH 36 (MISCELLANEOUS) IMPLANT
IMMOBILIZER KNEE 24 UNIV (MISCELLANEOUS)
IMPL FIBERSTICH 2-0 CVD (Anchor) IMPLANT
IMPLANT FIBERSTICH 2-0 CVD (Anchor) ×2 IMPLANT
IV NS IRRIG 3000ML ARTHROMATIC (IV SOLUTION) ×4 IMPLANT
KIT TRANSTIBIAL (DISPOSABLE) ×1 IMPLANT
KIT TURNOVER KIT B (KITS) ×1 IMPLANT
KNIFE GRAFT ACL 10MM 5952 (MISCELLANEOUS) ×1 IMPLANT
KNIFE GRAFT ACL 9MM (MISCELLANEOUS) IMPLANT
MANIFOLD NEPTUNE II (INSTRUMENTS) ×1 IMPLANT
NS IRRIG 1000ML POUR BTL (IV SOLUTION) ×1 IMPLANT
PACK ARTHROSCOPY DSU (CUSTOM PROCEDURE TRAY) ×1 IMPLANT
PACK BASIN DAY SURGERY FS (CUSTOM PROCEDURE TRAY) ×1 IMPLANT
PAD COLD SHLDR WRAP-ON (PAD) ×1 IMPLANT
PENCIL SMOKE EVACUATOR (MISCELLANEOUS) IMPLANT
PORT APPOLLO RF 90DEGREE MULTI (SURGICAL WAND) IMPLANT
PORTAL SKID DEVICE (INSTRUMENTS) IMPLANT
SCREW SHEATHED INTERF 7X25 (Screw) IMPLANT
SCREW SHEATHED INTERF 8X20MM (Screw) IMPLANT
SLEEVE SCD COMPRESS KNEE MED (STOCKING) ×1 IMPLANT
SPIKE FLUID TRANSFER (MISCELLANEOUS) IMPLANT
SPONGE T-LAP 4X18 ~~LOC~~+RFID (SPONGE) ×1 IMPLANT
SUT FIBERWIRE #2 38 T-5 BLUE (SUTURE) ×4
SUT MNCRL AB 4-0 PS2 18 (SUTURE) ×1 IMPLANT
SUT VIC AB 0 CT1 27 (SUTURE) ×1
SUT VIC AB 0 CT1 27XBRD ANBCTR (SUTURE) ×1 IMPLANT
SUT VIC AB 3-0 SH 27 (SUTURE) ×1
SUT VIC AB 3-0 SH 27X BRD (SUTURE) ×1 IMPLANT
SUTURE FIBERWR #2 38 T-5 BLUE (SUTURE) ×4 IMPLANT
TISSUE GRAFT COLLECTOR (SYSTAGENIX WOUND MANAGEMENT) IMPLANT
TOWEL GREEN STERILE FF (TOWEL DISPOSABLE) ×2 IMPLANT
TUBE CONNECTING 20X1/4 (TUBING) ×1 IMPLANT
TUBE SUCTION HIGH CAP CLEAR NV (SUCTIONS) ×1 IMPLANT
TUBING ARTHROSCOPY IRRIG 16FT (MISCELLANEOUS) ×1 IMPLANT
WATER STERILE IRR 1000ML POUR (IV SOLUTION) ×1 IMPLANT

## 2022-06-25 NOTE — Discharge Instructions (Addendum)
Ramond Marrow MD, MPH Alfonse Alpers, PA-C [facility] [address], [address] [phone] (tel)   [phone] (fax)   POST-OPERATIVE INSTRUCTIONS - ACL RECONSTRUCTION  WOUND CARE You may remove the Operative Dressing on Post-Op Day #3 (72hrs after surgery).   Leave steri strips in place.   If you feel more comfortable with it you can leave all dressings in place till your 1 week follow-up appointment.   KEEP THE INCISIONS CLEAN AND DRY. An ACE wrap may be used to control swelling, do not wrap this too tight.  If the initial ACE wrap feels too tight or constricting you may loosen it. There may be a small amount of fluid/bleeding leaking at the surgical site.  This is normal; the knee is filled with fluid during the procedure and can leak for 24-48hrs after surgery.  You may change/reinforce the bandage as needed.  Use the Cryocuff, GameReady or Ice as often as possible for the first 3-4 days, then as needed for pain relief. Always keep a towel, ACE wrap or other barrier between the cooling unit and your skin.  You may shower on Post-Op Day #3. Gently pat the area dry.  Do not soak the knee in water.  Do not go swimming in the pool or ocean until 4 weeks after surgery or when otherwise instructed.  BRACE/AMBULATION Your leg will be placed in a brace post-operatively.  You may remove for hygiene only! You will need to wear your brace at all times until we discuss it further.  It should be locked in full extension (0 degrees) if adjustable.   You will be instructed on further bracing after your first visit. Use crutches for comfort but you can put your full weight on the leg as tolerated.  PHYSICAL THERAPY - You will begin physical therapy soon after surgery (unless otherwise specified) - Please call to set up an appointment, if you do not already have one  - Let our office if there are any issues with scheduling your therapy  - A PT referral was sent to [facility] on [address] - A hard copy was provided to you today as well   REGIONAL ANESTHESIA (NERVE BLOCKS) The anesthesia team may have performed a nerve block for you this is a great tool used to minimize pain.   The block may start wearing off overnight (between 8-24 hours postop) When the block wears off, your pain may go from nearly zero to the pain you would have had postop without the block. This is an abrupt transition but nothing dangerous is happening.   This can be a challenging period but utilize your as needed pain medications to try and manage this period. We suggest you use the pain medication the first night prior to going to bed, to ease this transition.  You may take an extra dose of narcotic when this happens if needed  POST-OP MEDICATIONS- Multimodal approach to pain control In general your pain will be controlled with a combination of substances.  Prescriptions unless otherwise discussed are electronically sent to your pharmacy.  This is a carefully made plan we use to minimize narcotic use.     Meloxicam - Anti-inflammatory medication taken on a scheduled basis Acetaminophen - Non-narcotic pain medicine taken on a scheduled basis  Oxycodone - This is a strong narcotic, to be used only on an "as needed" basis for SEVERE pain. Aspirin 81mg  - This medicine is used to minimize the risk  of blood clots after surgery. Robaxin - this is a muscle relaxer, take as needed for muscle spasms  Zofran - take as needed for nausea   FOLLOW-UP Please call the office to schedule a follow-up appointment for your incision check if you do not already have one, 7-10 days post-operatively. IF YOU HAVE ANY QUESTIONS, PLEASE FEEL FREE TO CALL OUR OFFICE.  HELPFUL INFORMATION   Keep your leg elevated to decrease swelling, which will then in turn decrease your pain. I would elevate the foot of your bed by putting a couple of couch pillows between your mattress and box spring. I would  not keep pillow directly under your ankle.  You must wear the brace locked while sleeping and ambulating until follow-up.   There will be MORE swelling on days 1-3 than there is on the day of surgery.  This also is normal. The swelling will decrease with the anti-inflammatory medication, ice and keeping it elevated. The swelling will make it more difficult to bend your knee. As the swelling goes down your motion will become easier  You may develop swelling and bruising that extends from your knee down to your calf and perhaps even to your foot over the next week. Do not be alarmed. This too is normal, and it is due to gravity  There may be some numbness adjacent to the incision site. This may last for 6-12 months or longer in some patients and is expected.  You may return to sedentary work/school in the next couple of days when you feel up to it. You will need to keep your leg elevated as much as possible   You should wean off your narcotic medicines as soon as you are able.  Most patients will be off or using minimal narcotics before their first postop appointment.   We suggest you use the pain medication the first night prior to going to bed, in order to ease any pain when the anesthesia wears off. You should avoid taking pain medications on an empty stomach as it will make you nauseous.  Do not drink alcoholic beverages or take illicit drugs when taking pain medications.  It is against the law to drive while taking narcotics. You cannot drive if your Right leg is in brace locked in extension.  Pain medication may make you constipated.  Below are a few solutions to try in this order: Decrease the amount of pain medication if you aren't having pain. Drink lots of decaffeinated fluids. Drink prune juice and/or each dried prunes  If the first 3 don't work start with additional solutions Take Colace - an over-the-counter stool softener Take Senokot - an over-the-counter laxative Take  Miralax - a stronger over-the-counter laxative  For more information including helpful videos and documents visit our website:   [email or web address]    Post Anesthesia Home Care Instructions  Activity: Get plenty of rest for the remainder of the day. A responsible individual must stay with you for 24 hours following the procedure.  For the next 24 hours, DO NOT: -Drive a car -Advertising copywriter -Drink alcoholic beverages -Take any medication unless instructed by your physician -Make any legal decisions or sign important papers.  Meals: Start with liquid foods such as gelatin or soup. Progress to regular foods as tolerated. Avoid greasy, spicy, heavy foods. If nausea and/or vomiting occur, drink only clear liquids until the nausea and/or vomiting subsides. Call your physician if vomiting continues.  Special Instructions/Symptoms: Your throat may feel dry or sore from  the anesthesia or the breathing tube placed in your throat during surgery. If this causes discomfort, gargle with warm salt water. The discomfort should disappear within 24 hours.    1. Numbness or the inability to move the "blocked" extremity may last from 3-48 hours after placement. The length of time depends on the medication injected and your individual response to the medication. If the numbness is not going away after 48 hours, call your surgeon.  2. The extremity that is blocked will need to be protected until the numbness is gone and the  Strength has returned. Because you cannot feel it, you will need to take extra care to avoid injury. Because it may be weak, you may have difficulty moving it or using it. You may not know what position it is in without looking at it while the block is in effect.  3. For blocks in the legs and feet, returning to weight bearing and walking needs to be done carefully. You will need to wait until the numbness is entirely gone and the strength has  returned. You should be able to move your leg and foot normally before you try and bear weight or walk. You will need someone to be with you when you first try to ensure you do not fall and possibly risk injury.  4. Bruising and tenderness at the needle site are common side effects and will resolve in a few days.  5. Persistent numbness or new problems with movement should be communicated to the surgeon or the [facility] [phone] [facility] [phone]).   Next Tylenol can be taken at 230pm today Next Ibuprofen can be taken at 5pm today

## 2022-06-25 NOTE — Transfer of Care (Signed)
Immediate Anesthesia Transfer of Care Note  Patient: Cheryl Cole  Procedure(s) Performed: KNEE ARTHROSCOPY WITH MEDIAL MENISECTOMY (Right: Knee) RECONSTRUCTION ANTERIOR CRUCIATE LIGAMENT (ACL) (Right: Knee)  Patient Location: PACU  Anesthesia Type:GA combined with regional for post-op pain  Level of Consciousness: drowsy  Airway & Oxygen Therapy: Patient Spontanous Breathing and Patient connected to face mask oxygen  Post-op Assessment: Report given to RN and Post -op Vital signs reviewed and stable  Post vital signs: Reviewed and stable  Last Vitals:  Vitals Value Taken Time  BP 104/62 06/25/22 1212  Temp    Pulse 69 06/25/22 1214  Resp 11 06/25/22 1214  SpO2 100 % 06/25/22 1214  Vitals shown include unvalidated device data.  Last Pain:  Vitals:   06/25/22 0831  TempSrc: Oral  PainSc: 0-No pain      Patients Stated Pain Goal: 8 (06/25/22 0831)  Complications: No notable events documented.

## 2022-06-25 NOTE — Interval H&P Note (Signed)
All questions answered, patient wants to proceed with procedure. ? ?

## 2022-06-25 NOTE — Anesthesia Procedure Notes (Signed)
Anesthesia Regional Block: Adductor canal block   Pre-Anesthetic Checklist: , timeout performed,  Correct Patient, Correct Site, Correct Laterality,  Correct Procedure, Correct Position, site marked,  Risks and benefits discussed,  Surgical consent,  Pre-op evaluation,  At surgeon's request and post-op pain management  Laterality: Right  Prep: chloraprep       Needles:  Injection technique: Single-shot  Needle Type: Echogenic Stimulator Needle     Needle Length: 5cm  Needle Gauge: 22     Additional Needles:   Procedures:,,,, ultrasound used (permanent image in chart),,    Narrative:  Start time: 06/25/2022 8:55 AM End time: 06/25/2022 9:05 AM Injection made incrementally with aspirations every 5 mL.  Performed by: Personally  Anesthesiologist: Bethena Midget, MD  Additional Notes: Functioning IV was confirmed and monitors were applied.  A 34mm 22ga Arrow echogenic stimulator needle was used. Sterile prep and drape,hand hygiene and sterile gloves were used. Ultrasound guidance: relevant anatomy identified, needle position confirmed, local anesthetic spread visualized around nerve(s)., vascular puncture avoided.  Image printed for medical record. Negative aspiration and negative test dose prior to incremental administration of local anesthetic. The patient tolerated the procedure well.

## 2022-06-25 NOTE — Anesthesia Preprocedure Evaluation (Addendum)
Anesthesia Evaluation  Patient identified by MRN, date of birth, ID band Patient awake    Reviewed: Allergy & Precautions, NPO status , Patient's Chart, lab work & pertinent test results  Airway Mallampati: I  TM Distance: >3 FB Neck ROM: Full    Dental no notable dental hx.    Pulmonary neg pulmonary ROS,    Pulmonary exam normal breath sounds clear to auscultation       Cardiovascular negative cardio ROS Normal cardiovascular exam Rhythm:Regular Rate:Normal     Neuro/Psych negative neurological ROS  negative psych ROS   GI/Hepatic negative GI ROS, Neg liver ROS,   Endo/Other  negative endocrine ROS  Renal/GU negative Renal ROS     Musculoskeletal negative musculoskeletal ROS (+)   Abdominal   Peds  Hematology negative hematology ROS (+)   Anesthesia Other Findings   Reproductive/Obstetrics hcg negative                            Anesthesia Physical  Anesthesia Plan  ASA: II  Anesthesia Plan: General and Regional   Post-op Pain Management:    Induction: Intravenous  PONV Risk Score and Plan: 3 and Ondansetron, Dexamethasone, Midazolam and Treatment may vary due to age or medical condition  Airway Management Planned: LMA  Additional Equipment:   Intra-op Plan:   Post-operative Plan: Extubation in OR  Informed Consent: I have reviewed the patients History and Physical, chart, labs and discussed the procedure including the risks, benefits and alternatives for the proposed anesthesia with the patient or authorized representative who has indicated his/her understanding and acceptance.     Dental advisory given  Plan Discussed with: CRNA  Anesthesia Plan Comments:         Anesthesia Quick Evaluation

## 2022-06-25 NOTE — Anesthesia Procedure Notes (Signed)
Procedure Name: LMA Insertion Date/Time: 06/25/2022 9:54 AM  Performed by: Lauralyn Primes, CRNAPre-anesthesia Checklist: Patient identified, Emergency Drugs available, Suction available and Patient being monitored Patient Re-evaluated:Patient Re-evaluated prior to induction Oxygen Delivery Method: Circle system utilized Preoxygenation: Pre-oxygenation with 100% oxygen Induction Type: IV induction Ventilation: Mask ventilation without difficulty LMA: LMA inserted LMA Size: 4.0 Number of attempts: 1 Airway Equipment and Method: Bite block Placement Confirmation: positive ETCO2 Tube secured with: Tape Dental Injury: Teeth and Oropharynx as per pre-operative assessment

## 2022-06-25 NOTE — H&P (Signed)
PREOPERATIVE H&P  Chief Complaint: RIGHT KNEE MEDIAL MENISCUS TEAR, ACL TEAR  HPI: Cheryl Cole is a 21 y.o. female who is scheduled for, Procedure(s): KNEE ARTHROSCOPY WITH MEDIAL MENISECTOMY RECONSTRUCTION ANTERIOR CRUCIATE LIGAMENT (ACL).   Patient is a healthy 21 year old who had a BTB ACL reconstruction on her left knee.  She did well with that.  Unfortunately she had a non-contact injury while she was jumping playing volleyball.  She had an injury to her right knee, immediate pop.  She had swelling.  She wants to know what her options are.  She has been in a brace.    Symptoms are rated as moderate to severe, and have been worsening.  This is significantly impairing activities of daily living.    Please see clinic note for further details on this patient's care.    She has elected for surgical management.   Past Medical History:  Diagnosis Date   Torn meniscus    Past Surgical History:  Procedure Laterality Date   KNEE ARTHROSCOPY WITH ANTERIOR CRUCIATE LIGAMENT (ACL) REPAIR Left 02/05/2021   Procedure: KNEE ARTHROSCOPY WITH ANTERIOR CRUCIATE LIGAMENT (ACL) REPAIR WITH AUTOGRAFT;  Surgeon: Bjorn Pippin, MD;  Location: [facility];  Service: Orthopedics;  Laterality: Left;   KNEE ARTHROSCOPY WITH LATERAL MENISECTOMY  02/05/2021   Procedure: KNEE ARTHROSCOPY WITH LATERAL MENISECTOMY;  Surgeon: Bjorn Pippin, MD;  Location: [facility];  Service: Orthopedics;;   KNEE ARTHROSCOPY WITH MEDIAL MENISECTOMY Left 02/05/2021   Procedure: KNEE ARTHROSCOPY WITH  MEDIAL MENISCUS REPAIR;  Surgeon: Bjorn Pippin, MD;  Location: [facility];  Service: Orthopedics;  Laterality: Left;   Social History   Socioeconomic History   Marital status: Single    Spouse name: Not on file   Number of children: Not on file   Years of education: Not on file   Highest education level: Not on file  Occupational History   Not on file  Tobacco Use    Smoking status: Never   Smokeless tobacco: Never  Vaping Use   Vaping Use: Never used  Substance and Sexual Activity   Alcohol use: Never   Drug use: Never   Sexual activity: Not on file  Other Topics Concern   Not on file  Social History Narrative   Not on file   Social Determinants of Health   Financial Resource Strain: Not on file  Food Insecurity: Not on file  Transportation Needs: Not on file  Physical Activity: Not on file  Stress: Not on file  Social Connections: Not on file   History reviewed. No pertinent family history. No Known Allergies Prior to Admission medications   Medication Sig Start Date End Date Taking? Authorizing Provider  acetaminophen (TYLENOL 8 HOUR) 650 MG CR tablet Take 1 tablet (650 mg total) by mouth every 8 (eight) hours as needed for pain. 02/05/21   Mada Sadik, Jerald Kief, PA-C  ibuprofen (ADVIL) 600 MG tablet Take 1 tablet (600 mg total) by mouth 3 (three) times daily. For pain / inflammation. 02/05/21   Eron Goble, Jerald Kief, PA-C  methocarbamol (ROBAXIN) 500 MG tablet Take 1 tablet (500 mg total) by mouth every 8 (eight) hours as needed for muscle spasms. 02/05/21   Caliah Kopke, Jerald Kief, PA-C    ROS: All other systems have been reviewed and were otherwise negative with the exception of those mentioned in the HPI and as above.  Physical Exam: General: Alert, no acute distress Cardiovascular: No  pedal edema Respiratory: No cyanosis, no use of accessory musculature GI: No organomegaly, abdomen is soft and non-tender Skin: No lesions in the area of chief complaint Neurologic: Sensation intact distally Psychiatric: Patient is competent for consent with normal mood and affect Lymphatic: No axillary or cervical lymphadenopathy  MUSCULOSKELETAL:  Range of motion of the right knee is 0 to 90 degrees.  She has obvious effusion.  She has grossly unstable Lachman on one exam, though her muscle tension makes it difficult to repeat the exam.   Imaging: MRI of the  right knee demonstrates complete ACL tear, tear of the medial meniscus  Assessment: RIGHT KNEE MEDIAL MENISCUS TEAR, ACL TEAR  Plan: Plan for Procedure(s): KNEE ARTHROSCOPY WITH MEDIAL MENISECTOMY RECONSTRUCTION ANTERIOR CRUCIATE LIGAMENT (ACL)  The risks benefits and alternatives were discussed with the patient including but not limited to the risks of nonoperative treatment, versus surgical intervention including infection, bleeding, nerve injury,  blood clots, cardiopulmonary complications, morbidity, mortality, among others, and they were willing to proceed.   The patient acknowledged the explanation, agreed to proceed with the plan and consent was signed.   Operative Plan: Right knee scope with BTB ACL reconstruction, meniscectomy versus repair Discharge Medications: Standard DVT Prophylaxis: Aspirin Physical Therapy: Outpatient PT Special Discharge needs: [place]. [place]   Vernetta Honey, PA-C  06/25/2022 6:27 AM

## 2022-06-25 NOTE — Progress Notes (Signed)
Assisted Dr. Oddono with right, adductor canal, ultrasound guided block. Side rails up, monitors on throughout procedure. See vital signs in flow sheet. Tolerated Procedure well. 

## 2022-06-25 NOTE — Op Note (Signed)
Orthopaedic Surgery Operative Note (CSN: [id number])  Cheryl Cole. Cheryl Cole  06-18-2001 Date of Surgery: 06/25/2022   Diagnoses:  Right knee medial lateral meniscus tears, ACL rupture  Procedure: Right BTB autograft ACL reconstruction Right partial medial meniscectomy 40% total meniscal volume Right lateral meniscus repair    Operative Finding Exam under anesthesia: Grossly unstable Lachman without endpoint, hyperextension of 5 degrees, full flexion, remainder of ligaments exam was normal Suprapatellar pouch: Normal Patellofemoral Compartment: Normal Medial Compartment: Complex posterior medial meniscus tear with horizontal, vertical and radial components.  This was unable to be repaired.  40% total meniscal volume resected, there was very little radial fiber left. Lateral Compartment: Posterior lateral meniscus tear that was amenable to repair.  We placed 2 fiber stitch anchors and a good repair. Intercondylar Notch: Complete ACL rupture  Successful completion of the planned procedure.  Unfortunately patient's medial meniscus was not amenable to repair, she may have overload symptoms.  Her ACL reconstruction was routine.  Post-operative plan: The patient will be weightbearing to tolerance with a knee brace locked in extension following meniscal repair ACL protocol.  The patient will be discharged home.  DVT prophylaxis Aspirin 81 mg twice daily for 6 weeks.  Pain control with PRN pain medication preferring oral medicines.  Follow up plan will be scheduled in approximately 7 days for incision check and XR.  Post-Op Diagnosis: Same Surgeons:Primary: Bjorn Pippin, MD Assistants:Caroline McBane PA-C Location: [facility] OR ROOM 1 Anesthesia: General with adductor canal Antibiotics: Ancef 2 g with local vancomycin powder 1 g at the surgical site Tourniquet time: 60 Estimated Blood Loss: Minimal Complications: None Specimens: None Implants: Implant Name Type Inv. Item Serial No. Manufacturer Lot  No. LRB No. Used Action  IMPLANT FIBERSTICH 2-0 CVD - [id number] Anchor IMPLANT FIBERSTICH 2-0 CVD  [employer] [id number] Right 1 Implanted  IMPLANT FIBERSTICH 2-0 CVD - [id number] Anchor IMPLANT FIBERSTICH 2-0 CVD  [employer] [id number] Right 1 Implanted  SCREW SHEATHED INTERF 7X25 - [id number] Screw SCREW SHEATHED INTERF 7X25 [id number] [employer] [id number] Right 1 Implanted  SCREW SHEATHED INTERF 8X20MM - [id number] Screw SCREW SHEATHED INTERF 8X20MM  [employer] [id number] Right 1 Implanted    Indications for Surgery:   Cheryl Cole is a 21 y.o. female with ACL rupture and medial moderate meniscus tears.  Benefits and risks of operative and nonoperative management were discussed prior to surgery with patient/guardian(s) and informed consent form was completed.  Specific risks including infection, need for additional surgery, recurrent instability, retear, stiffness and meniscal failure as well as overload   Procedure:   The patient was identified properly. Informed consent was obtained and the surgical site was marked. The patient was taken up to suite where general anesthesia was induced. The patient was placed in the supine position with a post against the surgical leg and a nonsterile tourniquet applied. The surgical leg was then prepped and draped usual sterile fashion.  A standard surgical timeout was performed.  2 standard anterior portals were made and diagnostic arthroscopy performed. Please note the findings as noted above.  We began by making an incision along the medial third of the patellar tendon in line with the tendon itself starting at the level of the distal pole of the patella ending 3 cm distal to the insertion on the tubercle. We carried the incision down sharply achieving hemostasis 3 progressed identifying the tissue plane of the peritenon. The created skin flaps medially and laterally taking care to avoid damage to the superficial skin.  This point the peritenon was incised sharply  in line with the tendon and again flaps are created exposing the medial and lateral borders of the tendon. We then took care to ensure that there is appropriate visualization for forearm harvest within our incision using a mobile window technique.  We then used a double bladed scalpel incised the tendon longitudinally 10 mm wide. This incision within the tendon was carried proximal and distally onto the tubercle and proximal wound patella to create a 25 mm bone block from patella and a 27 mm bone block from the tibia.  We made our longitudinal cuts with a saw taking care to not go deeper than 10 mm and rather than make a transverse patella cut we made a bullet style tapered cut at the proximal aspect of the patella to avoid the risk for transverse patella fracture.  The harvest went without issue and graft was taken to the back table.    This point we closed the defect in the patellar tendon after identifying that there was appropriate medial and lateral tendon still intact.   We began arthroscopy and made our lateral and medial portals within our incision on each side of the tendon. Fat pad was resected and diagnostic arthroscopy performed with the findings listed above.   Partial medial meniscectomy was performed with a shaver and basket back to a stable base.  Lateral meniscus was amenable to repair.  We stimulated with a shaver without suction.  We then placed 2 fiber stitch anchors in typical fashion obtaining good purchase.  Stable reduction was noted.  The anterior cruciate ligament stump was debrided utilizing a shaver taking great care to preserve the remnant stump on the femur and the tibia for localization of our tunnels. Once the remnant anterior cruciate ligament was removed and we obtained appropriate visualization by performing a small notchplasty and confirmed that we had indeed identify the over-the-top position. We made small marks at the location of the aperture of the tibial and  femoral tunnels and double checked our location prior to drilling.  We began with a femoral tunnel.  We had taken care to make a far medial and low anteromedial portal with spinal needle localization to ensure that we can get appropriate position on the lateral wall of the notch from an anterior medial portal drilling technique.  We used a 7 mm offset guide with the knee at 90 degrees of flexion to mark in the position of the old ACL stump.  We then switched our camera to the medial portal and checked that our position was appropriate compared to the back wall.  At that point we hyperflexed the knee and used the 7 mm offset guide again primarily as a sheath to freehand place our wire at the aforementioned mark taking care to exit anterior to the mid femoral line to avoid posterior wall blowout.  Once the wire was advanced through the skin we clamped it and then placed a 10 mm acorn style reamer by hand ensuring that we did not interfere with the medial femoral condyle.  Once it entered the notch we are able to connect it to a reamer and ream to 34 mm of tunnel depth.  We used an Arthrex GraftNet device to harvest with a shaver any of the bony debris and cleared bony debris from the tunnel to ensure that we had an easy pass.  We again checked from the medial portal to ensure that we only had a 2 mm posterior  wall and appropriate position of our tunnel at the 10 clock position.  At this point we advanced our Beath pin and shuttled a #2 FiberWire for eventual graft passage.  We used a freehand technique to place a 2.4 guidewire tibial tunnel to ensure that we were centered using the landmark of the posterior aspect of the anterior horn of the lateral meniscus as well as the previous stump.  We took great care to ensure that our wire was positioned appropriately.  We then reamed with the barrel reamer through our ACL harvest incision taking care to protect the skin.  We harvested bone as we reamed and then  completed the reaming into the joint.  Any soft tissue was cleared from the apertures of the tunnel.  We finally checked our tunnel position and apertures once more and were happy were these and proceeded with graft shuttling.  The graft was shuttled in typical fashion and we were able to visualize entering the femoral tunnel.  We ensured that the cancellous surface of the graft was anterior moving the collagen of the graft as far posterior as possible.  Before advancing the graft into the femoral socket we placed a guidewire for screw fixation.  The graft was then advanced the appropriate depth and we hyperflexed the knee for placement of our screw.  Femoral fixation was with a 7 x 25 mm metal Arthrex screw. We obtained good purchase with the screw. We verified arthroscopically that there is no sign of graft impingement on the notch. We then cycled the knee multiple times and turned our attention to the tibia.  Tibia was fixed with a 8 x 20 mm metal Arthrex screw and the graft was extremely rotated 90 to anteriorize the tendinous portion within the joint. We achieved good purchase of the graft and there was minimal mismatch.  At this point a gentle Lachman maneuver was performed and there is a stable endpoint and little translation.  Autograft harvested from left over graft prep as well as reamings was used to bone graft the patella as well as the tibial defects the peritenon was closed.  Incision was closed in multilayer fashion with absorbable suture and Steri-Strips placed. Sterile dressing and knee brace were placed and patient taken to PACU without adverse event.   Alfonse Alpers, PA-C, present and scrubbed throughout the case, critical for completion in a timely fashion, and for retraction, instrumentation, closure.

## 2022-06-26 ENCOUNTER — Encounter (HOSPITAL_BASED_OUTPATIENT_CLINIC_OR_DEPARTMENT_OTHER): Payer: Self-pay | Admitting: Orthopaedic Surgery

## 2022-06-26 NOTE — Therapy (Signed)
OUTPATIENT PHYSICAL THERAPY LOWER EXTREMITY EVALUATION   Patient Name: Cheryl Cole MRN: [id number] DOB:[birth year], 21 y.o., female Today's Date: 06/26/2022    Past Medical History:  Diagnosis Date   Torn meniscus    Past Surgical History:  Procedure Laterality Date   ANTERIOR CRUCIATE LIGAMENT REPAIR Right 06/25/2022   Procedure: RECONSTRUCTION ANTERIOR CRUCIATE LIGAMENT (ACL);  Surgeon: Bjorn Pippin, MD;  Location: [facility];  Service: Orthopedics;  Laterality: Right;   KNEE ARTHROSCOPY WITH ANTERIOR CRUCIATE LIGAMENT (ACL) REPAIR Left 02/05/2021   Procedure: KNEE ARTHROSCOPY WITH ANTERIOR CRUCIATE LIGAMENT (ACL) REPAIR WITH AUTOGRAFT;  Surgeon: Bjorn Pippin, MD;  Location: [facility];  Service: Orthopedics;  Laterality: Left;   KNEE ARTHROSCOPY WITH LATERAL MENISECTOMY  02/05/2021   Procedure: KNEE ARTHROSCOPY WITH LATERAL MENISECTOMY;  Surgeon: Bjorn Pippin, MD;  Location: [facility];  Service: Orthopedics;;   KNEE ARTHROSCOPY WITH MEDIAL MENISECTOMY Left 02/05/2021   Procedure: KNEE ARTHROSCOPY WITH  MEDIAL MENISCUS REPAIR;  Surgeon: Bjorn Pippin, MD;  Location: [facility];  Service: Orthopedics;  Laterality: Left;   KNEE ARTHROSCOPY WITH MEDIAL MENISECTOMY Right 06/25/2022   Procedure: KNEE ARTHROSCOPY WITH MEDIAL MENISECTOMY;  Surgeon: Bjorn Pippin, MD;  Location:  [facility];  Service: Orthopedics;  Laterality: Right;   There are no problems to display for this patient.   PCP: Cleatrice Burke, MD  REFERRING PROVIDER: Bjorn Pippin, MD  REFERRING DIAG: Right knee arthroscopy with ACL reconstruction with BTB autograft and medical meniscus repair 8/24  THERAPY DIAG:  No diagnosis found.  Rationale for Evaluation and Treatment Rehabilitation  ONSET DATE: 06/25/2022   SUBJECTIVE:  SUBJECTIVE STATEMENT: ***  PERTINENT HISTORY: s/p right ACL reconstruction with BTB autograft, medial  meniscectomy, lateral meniscus repair 06/25/2022  PAIN:  Are you having pain? Yes:  NPRS scale: ***/10 Pain location: *** Pain description: *** Aggravating factors: *** Relieving factors: ***  PRECAUTIONS: Knee  WEIGHT BEARING RESTRICTIONS: WBAT with knee brace locked in extension  FALLS:  Has patient fallen in last 6 months? 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   OBJECTIVE:  PATIENT SURVEYS:  FOTO ***  COGNITION: Overall cognitive status: Within functional limits for tasks assessed     SENSATION: WFL  EDEMA:  Circumferential: ***  MUSCLE LENGTH: ***  PALPATION: ***  LOWER EXTREMITY ROM:  *** ROM Right eval Left eval  Knee flexion    Knee extension     LOWER EXTREMITY MMT:    ***  MMT Right eval Left eval  Hip flexion    Hip extension    Hip abduction    Knee flexion -   Knee extension -    FUNCTIONAL TESTS:  {Functional tests:24029}  GAIT: Distance walked: *** Assistive device utilized: {Assistive devices:23999} Level of assistance: {Levels of assistance:24026} Comments: ***   TODAY'S TREATMENT: ***   PATIENT EDUCATION:  Education details: Exam findings, POC, HEP, post-op precautions and expected progression Person educated: Patient Education method: Explanation, Demonstration, Tactile cues, Verbal cues, and Handouts Education comprehension: verbalized understanding, returned demonstration, verbal cues required, tactile cues required, and needs further education  HOME EXERCISE PROGRAM: ***   ASSESSMENT: CLINICAL IMPRESSION: Patient is a 21 y.o. female who was seen today for physical therapy evaluation and treatment for s/p right ACL reconstruction with BTB autograft, medial  meniscectomy, lateral meniscus repair.  ***   OBJECTIVE IMPAIRMENTS {opptimpairments:25111}.   ACTIVITY LIMITATIONS {activitylimitations:27494}  PARTICIPATION LIMITATIONS: {participationrestrictions:25113}  PERSONAL FACTORS {Personal factors:25162} are also affecting patient's functional outcome.   REHAB POTENTIAL: Good  CLINICAL DECISION MAKING: Stable/uncomplicated  EVALUATION COMPLEXITY: Low   GOALS: Goals reviewed with patient? {yes/no:20286}  SHORT TERM GOALS: Target date: {follow up:25551}   Patient will be I with initial HEP in order to progress with therapy. Baseline: HEP provided at eval Goal status: INITIAL  2.  Patient will perform SLR x *** repetitions in order to improve quad control with ambulation Baseline: *** Goal status: INITIAL  3.  Patient will demonstrate normalize gait mechanics without AD in order to improve walking ability Baseline: gait deviations include *** Goal status: INITIAL  4.  Patient will demonstrate right knee AROM grossly WFL in order to improve *** Baseline: right knee AROM *** deg Goal status: INITIAL  LONG TERM GOALS: Target date: {follow up:25551}   Patient will be I with final HEP to maintain progress from PT. Baseline: HEP provided at eval Goal status: INITIAL  2.  Patient will report >/= ***% status on FOTO to indicate improved functional ability. Baseline: % functional status Goal status: INITIAL  3.  Patient will be able to demonstrate a squat with deviation or pain in order to *** Baseline: *** Goal status: INITIAL  4.  *** Baseline:  Goal status: INITIAL   PLAN: PT FREQUENCY: 1-2x/week  PT DURATION: 8 weeks  PLANNED INTERVENTIONS: Therapeutic exercises, Therapeutic activity, Neuromuscular re-education, Balance training, Gait training, Patient/Family education, Self Care, Joint mobilization, Joint manipulation, Aquatic Therapy, Dry Needling, Electrical stimulation, Cryotherapy, Moist heat, Taping, Vasopneumatic device, Manual therapy, and  Re-evaluation  PLAN FOR NEXT SESSION: Review HEP and progress PRN, ***   Rosana Hoes, PT, DPT, LAT, ATC 06/26/22  11:55 AM Phone: [phone] Fax: [phone]    Check all possible CPT codes: {cptcodes:24818}     If treatment provided at initial evaluation, no treatment charged due to lack of authorization.

## 2022-06-27 ENCOUNTER — Encounter: Payer: Self-pay | Admitting: Physical Therapy

## 2022-06-27 ENCOUNTER — Ambulatory Visit: Payer: 59 | Attending: Orthopaedic Surgery | Admitting: Physical Therapy

## 2022-06-27 ENCOUNTER — Other Ambulatory Visit: Payer: Self-pay

## 2022-06-27 DIAGNOSIS — M25561 Pain in right knee: Secondary | ICD-10-CM | POA: Diagnosis not present

## 2022-06-27 DIAGNOSIS — R2689 Other abnormalities of gait and mobility: Secondary | ICD-10-CM | POA: Insufficient documentation

## 2022-06-27 DIAGNOSIS — M6281 Muscle weakness (generalized): Secondary | ICD-10-CM | POA: Insufficient documentation

## 2022-06-27 DIAGNOSIS — G8929 Other chronic pain: Secondary | ICD-10-CM | POA: Diagnosis not present

## 2022-06-27 NOTE — Patient Instructions (Signed)
Access Code: [email or web address] URL: [email or web address] Date: 06/27/2022 Prepared by: Rosana Hoes  Exercises - Long Sitting Quad Set  - 8-10 x daily - 20 reps - 5 seconds hold - Supine Knee Extension Mobilization with Weight  - 3 x daily - 3-5 minutes hold - Long Sitting Quad Set with Towel Roll Under Heel  - 3 x daily - 10 reps - 5 seconds hold - Supine Heel Slide with Strap  - 3 x daily - 10 reps - 5 seconds hold - Long Sitting Calf Stretch with Strap  - 3 x daily - 3 reps - 30 seconds hold - Long Sitting Hamstring Stretch  - 3 x daily - 3 reps - 30 seconds hold - Active Straight Leg Raise with Quad Set  - 3 x daily - 10 reps - Sidelying Hip Abduction  - 2 x daily - 2 sets - 10 reps - Prone Hip Extension  - 2 x daily - 2 sets - 10 reps - Sidelying Hip Adduction  - 2 x daily - 2 sets - 10 reps

## 2022-06-28 NOTE — Anesthesia Postprocedure Evaluation (Signed)
Anesthesia Post Note  Patient: Cheryl Cole. Kliewer  Procedure(s) Performed: KNEE ARTHROSCOPY WITH MEDIAL MENISECTOMY (Right: Knee) RECONSTRUCTION ANTERIOR CRUCIATE LIGAMENT (ACL) (Right: Knee)     Patient location during evaluation: PACU Anesthesia Type: Regional and General Level of consciousness: awake and alert Pain management: pain level controlled Vital Signs Assessment: post-procedure vital signs reviewed and stable Respiratory status: spontaneous breathing, nonlabored ventilation, respiratory function stable and patient connected to nasal cannula oxygen Cardiovascular status: blood pressure returned to baseline and stable Postop Assessment: no apparent nausea or vomiting Anesthetic complications: no   No notable events documented.  Last Vitals:  Vitals:   06/25/22 1245 06/25/22 1259  BP: 115/75 117/63  Pulse: 63 98  Resp: 13   Temp:  (!) 36.3 C  SpO2: 94% 98%    Last Pain:  Vitals:   06/25/22 1259  TempSrc:   PainSc: 7    Pain Goal: Patients Stated Pain Goal: 8 (06/25/22 0831)                 [name]

## 2022-06-29 NOTE — Therapy (Signed)
OUTPATIENT PHYSICAL THERAPY TREATMENT NOTE   Patient Name: Cheryl Cole MRN: [id number] DOB:2000-12-20, 21 y.o., female Today's Date: 07/01/2022  PCP: Cleatrice Burke, MD   REFERRING PROVIDER: Bjorn Pippin, MD   END OF SESSION:   PT End of Session - 07/01/22 1154     Visit Number 2    Number of Visits 17    Date for PT Re-Evaluation 08/22/22    Authorization Type [employer]    Authorization Time Period 07/01/2022 - 08/01/2022    Authorization - Visit Number 1    Authorization - Number of Visits 8    PT Start Time 1130    PT Stop Time 1215    PT Time Calculation (min) 45 min    Equipment Utilized During Treatment Right knee immobilizer    Activity Tolerance Patient tolerated treatment well    Behavior During Therapy WFL for tasks assessed/performed             Past Medical History:  Diagnosis Date   Torn meniscus    Past Surgical History:  Procedure Laterality Date   ANTERIOR CRUCIATE LIGAMENT REPAIR Right 06/25/2022   Procedure: RECONSTRUCTION ANTERIOR CRUCIATE LIGAMENT (ACL);  Surgeon: Bjorn Pippin, MD;  Location: [facility];  Service: Orthopedics;  Laterality: Right;   KNEE ARTHROSCOPY WITH ANTERIOR CRUCIATE LIGAMENT (ACL) REPAIR Left 02/05/2021   Procedure: KNEE ARTHROSCOPY WITH ANTERIOR CRUCIATE LIGAMENT (ACL) REPAIR WITH AUTOGRAFT;  Surgeon: Bjorn Pippin, MD;  Location: [facility];  Service: Orthopedics;  Laterality: Left;   KNEE ARTHROSCOPY WITH LATERAL MENISECTOMY  02/05/2021   Procedure: KNEE ARTHROSCOPY WITH LATERAL MENISECTOMY;  Surgeon: Bjorn Pippin, MD;  Location: [facility];  Service: Orthopedics;;   KNEE ARTHROSCOPY WITH MEDIAL MENISECTOMY Left 02/05/2021   Procedure: KNEE ARTHROSCOPY WITH  MEDIAL MENISCUS REPAIR;  Surgeon: Bjorn Pippin, MD;  Location: [facility];  Service: Orthopedics;  Laterality: Left;   KNEE ARTHROSCOPY WITH MEDIAL MENISECTOMY Right 06/25/2022   Procedure: KNEE  ARTHROSCOPY WITH MEDIAL MENISECTOMY;  Surgeon: Bjorn Pippin, MD;  Location: [facility];  Service: Orthopedics;  Laterality: Right;   There are no problems to display for this patient.   REFERRING DIAG: Right knee arthroscopy with ACL reconstruction with BTB autograft and medical meniscus repair 8/24  THERAPY DIAG:  Chronic pain of right knee  Muscle weakness (generalized)  Other abnormalities of gait and mobility  Rationale for Evaluation and Treatment Rehabilitation  PERTINENT HISTORY: s/p right ACL reconstruction with BTB autograft, medial meniscectomy, lateral meniscus repair 06/25/2022  PRECAUTIONS: Knee  SUBJECTIVE: Patient reports she is doing well. She has been icing and working on her exercises. She denies any pain currently. She does feel like she could bend her knee further but has been holding off on this to remain below 90 deg.  PAIN:  Are you having pain? 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   OBJECTIVE: (objective measures completed at initial evaluation unless otherwise dated) PATIENT SURVEYS:  LEFS: 17/80  EDEMA:  Circumferential: Right: 39 cm, Left: 37 cm    MUSCLE LENGTH: Hamstring, calf, and quad flexibility deficit  LOWER EXTREMITY ROM:   Active ROM Right eval Left eval Right 07/01/2022  Knee flexion 60 135 82  Knee extension 5 lacking 8 hyper 1 hyper  LOWER EXTREMITY MMT:   Patient with good quad activation, slight extension lag with SLR  07/01/2022: patient able to perform SLR without lag   MMT Right eval Left eval  Hip flexion 4- 4  Hip extension 4- 4  Hip abduction 4- 4  Knee flexion - 5  Knee extension - 5    FUNCTIONAL TESTS:  Not assessed   GAIT: Assistive device utilized: Crutches Level of  assistance: Modified independence Comments: Right knee immobilizer locked in extension, circumduction on right, forward trunk lean on crutches, flat foot strike     TODAY'S TREATMENT: [facility] Adult PT Treatment:                                                DATE: 07/01/2022 Therapeutic Exercise: Quad set with heel prop 2 x 10 with 5 sec hold SLR without brace 2 x 10 Hooklying ATKHE using strap for assist 3 x 5 Prone ATKE with foot on bolster 2 x 10 Prone hamstring curl 2 x 10 Active heel slide 2 x 10  Longsitting ankle PF with blue 2 x 20 Sidelying hip abduction with brace on 2 x 15 Prone hip extension with brace 2 x 15 Sidelying hip adduction with brace on 2 x 15   [facility] Adult PT Treatment:                                                DATE: 06/27/2022 Therapeutic Exercise: Removed patient's bandages and replaced gauze, re-wrapped ACE bandage Quad set 20 x 5 sec Heel slide with strap 10 x 5 sec Longsitting calf stretch with strap 3 x 30 sec Longsitting hamstring stretch 3 x 30 sec Heel prop x 3 min Quad set with heel prop 10 x 5 sec 4-way hip with brace donned and locked in extension x 10 each Gait training: heel-toe progression, posture   PATIENT EDUCATION:  Education details: HEP update to add active hyperextension with strap Person educated: Patient Education method: Explanation, Demonstration, Tactile cues, Verbal cues, Handout Education comprehension: verbalized understanding, returned demonstration, verbal cues required, tactile cues required, and needs further education   HOME EXERCISE PROGRAM: Access Code: [email or web address]     ASSESSMENT: CLINICAL IMPRESSION: Patient tolerated therapy well with no adverse effects. She demonstrates improved range of motion and ability to perform SLR without lag this visit. Therapy focused on improving her active knee hyperextension and quad control this visit. She is progressing well and did not report any increased pain with therapy. Patient  would benefit from continued skilled PT to progress her mobility and strength in order to reduce pain and maximize functional ability.     OBJECTIVE IMPAIRMENTS Abnormal gait, decreased activity tolerance, decreased balance, difficulty walking, decreased ROM, decreased strength, impaired flexibility, improper body mechanics, and pain.    ACTIVITY LIMITATIONS lifting, bending, standing, squatting, stairs, transfers, and locomotion level   PARTICIPATION LIMITATIONS: meal prep, cleaning, shopping, community activity, and occupation   PERSONAL FACTORS Past/current experiences are also affecting patient's functional outcome.      GOALS: Goals reviewed with patient? Yes   SHORT TERM GOALS: Target date: 07/25/2022    Patient will be I with initial HEP in order to progress with therapy. Baseline: HEP provided at eval  Goal status: INITIAL   2.  Patient will perform SLR x 10 repetitions without extension lag in order to improve quad control with ambulation Baseline: good quad activation, slight extension lag with SLR, fatigues with repetitions Goal status: INITIAL   3.  Patient will demonstrate normalize gait mechanics without AD in order to improve walking ability Baseline: patient using bilateral crutches and knee immobilizer locked in extension Goal status: INITIAL   4.  Patient will demonstrate right knee AROM 5-0-90 deg in order to  Baseline: right knee AROM 5-60 deg Goal status: INITIAL   5. Patient will report pain level </= 2/10 with walking or PT related activities in order to allow progress with therapy and reduce functional limitations.            Baseline: 4/10 Goal Status: INITIAL   LONG TERM GOALS: Target date: 08/22/2022    Patient will be I with final HEP to maintain progress from PT. Baseline: HEP provided at eval Goal status: INITIAL   2.  Patient will report >/= 56/80 on LEFS in order to indicate improved functional ability without progression to running or jumping  tasks Baseline: 17/80 Goal status: INITIAL   3.  Patient will be able to demonstrate a squat without deviation or pain in order to progress strength and improve ability to perform household tasks Baseline: unable at eval Goal status: INITIAL   4.  Patient will demonstrate full right knee AROM in order to improve functional mobility and reduce limitations with movement Baseline: knee AROM 5-60 deg at evaluation Goal status: INITIAL     PLAN: PT FREQUENCY: 1-2x/week   PT DURATION: 8 weeks   PLANNED INTERVENTIONS: Therapeutic exercises, Therapeutic activity, Neuromuscular re-education, Balance training, Gait training, Patient/Family education, Self Care, Joint mobilization, Joint manipulation, Aquatic Therapy, Dry Needling, Electrical stimulation, Cryotherapy, Moist heat, Taping, Vasopneumatic device, Manual therapy, and Re-evaluation   PLAN FOR NEXT SESSION: Review HEP and progress PRN, knee extension ROM, quad activation progressing to SLR, patellar mobs, hip strengthening, gait training, vaso   Rosana Hoes, PT, DPT, LAT, ATC 07/01/22  12:20 PM Phone: [phone] Fax: [phone]

## 2022-07-01 ENCOUNTER — Other Ambulatory Visit: Payer: Self-pay

## 2022-07-01 ENCOUNTER — Encounter: Payer: Self-pay | Admitting: Physical Therapy

## 2022-07-01 ENCOUNTER — Ambulatory Visit: Payer: 59 | Admitting: Physical Therapy

## 2022-07-01 DIAGNOSIS — G8929 Other chronic pain: Secondary | ICD-10-CM

## 2022-07-01 DIAGNOSIS — R2689 Other abnormalities of gait and mobility: Secondary | ICD-10-CM

## 2022-07-01 DIAGNOSIS — M6281 Muscle weakness (generalized): Secondary | ICD-10-CM

## 2022-07-01 DIAGNOSIS — M25561 Pain in right knee: Secondary | ICD-10-CM | POA: Diagnosis not present

## 2022-07-01 NOTE — Patient Instructions (Signed)
Access Code: [email or web address] URL: [email or web address] Date: 07/01/2022 Prepared by: Rosana Hoes  Exercises - Long Sitting Quad Set  - 8-10 x daily - 20 reps - 5 seconds hold - Long Sitting Quad Set with Towel Roll Under Heel  - 3 x daily - 10 reps - 5 seconds hold - Long Sitting Quad Set  - 3 x weekly - 3 sets - 5 reps - 5 seconds hold - Supine Heel Slide with Strap  - 3 x daily - 10 reps - 5 seconds hold - Long Sitting Calf Stretch with Strap  - 3 x daily - 3 reps - 30 seconds hold - Long Sitting Hamstring Stretch  - 3 x daily - 3 reps - 30 seconds hold - Active Straight Leg Raise with Quad Set  - 3 x daily - 10 reps - Sidelying Hip Abduction  - 2 x daily - 2 sets - 10 reps - Prone Hip Extension  - 2 x daily - 2 sets - 10 reps - Sidelying Hip Adduction  - 2 x daily - 2 sets - 10 reps

## 2022-07-02 NOTE — Therapy (Signed)
OUTPATIENT PHYSICAL THERAPY TREATMENT NOTE   Patient Name: Cheryl Cole MRN: [id number] DOB:Apr 30, 2001, 21 y.o., female Today's Date: 07/03/2022  PCP: Lacie Draft, MD   REFERRING PROVIDER: Hiram Gash, MD   END OF SESSION:   PT End of Session - 07/03/22 0800     Visit Number 3    Number of Visits 17    Date for PT Re-Evaluation 08/22/22    Authorization Type [employer]    Authorization Time Period 07/01/2022 - 08/01/2022    Authorization - Visit Number 2    Authorization - Number of Visits 8    PT Start Time 0746    PT Stop Time 0830    PT Time Calculation (min) 44 min    Equipment Utilized During Treatment Right knee immobilizer    Activity Tolerance Patient tolerated treatment well    Behavior During Therapy [facility] for tasks assessed/performed              Past Medical History:  Diagnosis Date   Torn meniscus    Past Surgical History:  Procedure Laterality Date   ANTERIOR CRUCIATE LIGAMENT REPAIR Right 06/25/2022   Procedure: RECONSTRUCTION ANTERIOR CRUCIATE LIGAMENT (ACL);  Surgeon: Hiram Gash, MD;  Location: [place];  Service: Orthopedics;  Laterality: Right;   KNEE ARTHROSCOPY WITH ANTERIOR CRUCIATE LIGAMENT (ACL) REPAIR Left 02/05/2021   Procedure: KNEE ARTHROSCOPY WITH ANTERIOR CRUCIATE LIGAMENT (ACL) REPAIR WITH AUTOGRAFT;  Surgeon: Hiram Gash, MD;  Location: [place];  Service: Orthopedics;  Laterality: Left;   KNEE ARTHROSCOPY WITH LATERAL MENISECTOMY  02/05/2021   Procedure: KNEE ARTHROSCOPY WITH LATERAL MENISECTOMY;  Surgeon: Hiram Gash, MD;  Location: [place];  Service: Orthopedics;;   KNEE ARTHROSCOPY WITH MEDIAL MENISECTOMY Left 02/05/2021   Procedure: KNEE ARTHROSCOPY WITH  MEDIAL MENISCUS REPAIR;  Surgeon: Hiram Gash, MD;  Location: [place];  Service: Orthopedics;  Laterality: Left;   KNEE ARTHROSCOPY WITH MEDIAL MENISECTOMY Right 06/25/2022   Procedure: KNEE  ARTHROSCOPY WITH MEDIAL MENISECTOMY;  Surgeon: Hiram Gash, MD;  Location: [place];  Service: Orthopedics;  Laterality: Right;   There are no problems to display for this patient.   REFERRING DIAG: Right knee arthroscopy with ACL reconstruction with BTB autograft and medical meniscus repair 8/24  THERAPY DIAG:  Chronic pain of right knee  Muscle weakness (generalized)  Other abnormalities of gait and mobility  Rationale for Evaluation and Treatment Rehabilitation  PERTINENT HISTORY: s/p right ACL reconstruction with BTB autograft, medial meniscectomy, lateral meniscus repair 06/25/2022  PRECAUTIONS: Knee  SUBJECTIVE: Patient reports she continues to do well   PAIN:  Are you having pain? 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   OBJECTIVE: (objective measures completed at initial evaluation unless otherwise dated) PATIENT SURVEYS:  LEFS: 17/80  EDEMA:  Circumferential: Right: 39 cm, Left: 37 cm    MUSCLE LENGTH: Hamstring, calf, and quad flexibility deficit  LOWER EXTREMITY ROM:   Active ROM Right eval Left eval Right 07/01/2022 Right 07/03/2022  Knee flexion 60 135 82 90 deg  Knee extension 5 lacking 8 hyper 1 hyper     LOWER EXTREMITY MMT:   Patient with good quad activation, slight extension lag with SLR  07/01/2022: patient able to perform SLR without lag   MMT  Right eval Left eval  Hip flexion 4- 4  Hip extension 4- 4  Hip abduction 4- 4  Knee flexion - 5  Knee extension - 5    FUNCTIONAL TESTS:  Not assessed   GAIT: Assistive device utilized: Crutches Level of assistance: Modified independence Comments: Right knee immobilizer locked in extension, circumduction on right, forward trunk lean on crutches, flat foot  strike     TODAY'S TREATMENT: [facility] Adult PT Treatment:                                                DATE: 07/03/2022 Therapeutic Exercise: Quad set x 10 with 5 sec hold Hooklying ATKHE using strap for assist x 10 with 5 sec hold Seated knee flexion PROM Blood flow restriction Position and location of cuff: Supine and proximal thigh Limb occlusion pressure (mmHg): 200 mmHg Exercise pressure (mmHg): 140 mmHg (70%) Exercise prescription: 27,03,50,09, reps with 30-60 sec rest Exercise comment: SLR Active heel slide x 10 with 5 sec hold Blood flow restriction Position and location of cuff: Sidelying and proximal thigh Limb occlusion pressure (mmHg): 210 mmHg Exercise pressure (mmHg): 147 mmHg (70%) Exercise prescription: 38,18,29,93, reps with 30-60 sec rest Exercise comment: sidelying hip abduction Prone TKE x 10 with 5 sec hold Blood flow restriction Position and location of cuff: Prone and proximal thigh Limb occlusion pressure (mmHg): 200 mmHg Exercise pressure (mmHg): 140 mmHg (70%) Exercise prescription: 71,69,67,89, reps with 30-60 sec rest Exercise comment: prone hamstring curl   [facility] Adult PT Treatment:                                                DATE: 07/01/2022 Therapeutic Exercise: Quad set with heel prop 2 x 10 with 5 sec hold SLR without brace 2 x 10 Hooklying ATKHE using strap for assist 3 x 5 Prone ATKE with foot on bolster 2 x 10 Prone hamstring curl 2 x 10 Active heel slide 2 x 10  Longsitting ankle PF with blue 2 x 20 Sidelying hip abduction with brace on 2 x 15 Prone hip extension with brace 2 x 15 Sidelying hip adduction with brace on 2 x 15  [facility] Adult PT Treatment:                                                DATE: 06/27/2022 Therapeutic Exercise: Removed patient's bandages and replaced gauze, re-wrapped ACE bandage Quad set 20 x 5 sec Heel slide with strap 10 x 5 sec Longsitting calf stretch with strap 3 x 30 sec Longsitting hamstring stretch 3  x 30 sec Heel prop x 3 min Quad set with heel prop 10 x 5 sec 4-way hip with brace donned and locked in extension x 10 each Gait training: heel-toe progression, posture   PATIENT EDUCATION:  Education details: HEP Person educated: Patient Education method: Consulting civil engineer, Media planner, Corporate treasurer cues, Verbal cues Education comprehension: verbalized understanding, returned demonstration, verbal cues required, tactile cues required, and needs further education   HOME EXERCISE PROGRAM: Access Code: [email or web address]     ASSESSMENT: CLINICAL IMPRESSION: Patient tolerated therapy well  with no adverse effects. Utilized BFR this visit to promote strengthening of quad, hamstring, and hip musculature. She continues to exhibit good quad activation and her SLR is without lag. She achieved 90 deg of knee passive flexion with slight stretch but no pain. No changes made to HEP this visit. Patient would benefit from continued skilled PT to progress her mobility and strength in order to reduce pain and maximize functional ability.     OBJECTIVE IMPAIRMENTS Abnormal gait, decreased activity tolerance, decreased balance, difficulty walking, decreased ROM, decreased strength, impaired flexibility, improper body mechanics, and pain.    ACTIVITY LIMITATIONS lifting, bending, standing, squatting, stairs, transfers, and locomotion level   PARTICIPATION LIMITATIONS: meal prep, cleaning, shopping, community activity, and occupation   PERSONAL FACTORS Past/current experiences are also affecting patient's functional outcome.      GOALS: Goals reviewed with patient? Yes   SHORT TERM GOALS: Target date: 07/25/2022    Patient will be I with initial HEP in order to progress with therapy. Baseline: HEP provided at eval Goal status: INITIAL   2.  Patient will perform SLR x 10 repetitions without extension lag in order to improve quad control with ambulation Baseline: good quad activation, slight extension lag with SLR,  fatigues with repetitions 07/03/2022: patient able to perform SLR x 10 without lag Goal status: MET   3.  Patient will demonstrate normalize gait mechanics without AD in order to improve walking ability Baseline: patient using bilateral crutches and knee immobilizer locked in extension Goal status: INITIAL   4.  Patient will demonstrate right knee AROM 5-0-90 deg in order to  Baseline: right knee AROM 5-60 deg Goal status: INITIAL   5. Patient will report pain level </= 2/10 with walking or PT related activities in order to allow progress with therapy and reduce functional limitations.            Baseline: 4/10 Goal Status: INITIAL   LONG TERM GOALS: Target date: 08/22/2022    Patient will be I with final HEP to maintain progress from PT. Baseline: HEP provided at eval Goal status: INITIAL   2.  Patient will report >/= 56/80 on LEFS in order to indicate improved functional ability without progression to running or jumping tasks Baseline: 17/80 Goal status: INITIAL   3.  Patient will be able to demonstrate a squat without deviation or pain in order to progress strength and improve ability to perform household tasks Baseline: unable at eval Goal status: INITIAL   4.  Patient will demonstrate full right knee AROM in order to improve functional mobility and reduce limitations with movement Baseline: knee AROM 5-60 deg at evaluation Goal status: INITIAL     PLAN: PT FREQUENCY: 1-2x/week   PT DURATION: 8 weeks   PLANNED INTERVENTIONS: Therapeutic exercises, Therapeutic activity, Neuromuscular re-education, Balance training, Gait training, Patient/Family education, Self Care, Joint mobilization, Joint manipulation, Aquatic Therapy, Dry Needling, Electrical stimulation, Cryotherapy, Moist heat, Taping, Vasopneumatic device, Manual therapy, and Re-evaluation   PLAN FOR NEXT SESSION: Review HEP and progress PRN, knee extension ROM, quad activation progressing to SLR, patellar mobs, hip  strengthening, gait training, vaso   Hilda Blades, PT, DPT, LAT, ATC 07/03/22  8:33 AM Phone: [phone] Fax: [phone]

## 2022-07-03 ENCOUNTER — Ambulatory Visit: Payer: 59 | Attending: Orthopaedic Surgery | Admitting: Physical Therapy

## 2022-07-03 ENCOUNTER — Other Ambulatory Visit: Payer: Self-pay

## 2022-07-03 ENCOUNTER — Encounter: Payer: Self-pay | Admitting: Physical Therapy

## 2022-07-03 DIAGNOSIS — M6281 Muscle weakness (generalized): Secondary | ICD-10-CM | POA: Diagnosis not present

## 2022-07-03 DIAGNOSIS — G8929 Other chronic pain: Secondary | ICD-10-CM | POA: Diagnosis not present

## 2022-07-03 DIAGNOSIS — M25561 Pain in right knee: Secondary | ICD-10-CM | POA: Diagnosis not present

## 2022-07-03 DIAGNOSIS — R2689 Other abnormalities of gait and mobility: Secondary | ICD-10-CM | POA: Diagnosis not present

## 2022-07-07 ENCOUNTER — Encounter: Payer: Self-pay | Admitting: Physical Therapy

## 2022-07-07 ENCOUNTER — Other Ambulatory Visit: Payer: Self-pay

## 2022-07-07 ENCOUNTER — Ambulatory Visit: Payer: 59 | Admitting: Physical Therapy

## 2022-07-07 DIAGNOSIS — R2689 Other abnormalities of gait and mobility: Secondary | ICD-10-CM

## 2022-07-07 DIAGNOSIS — M6281 Muscle weakness (generalized): Secondary | ICD-10-CM

## 2022-07-07 DIAGNOSIS — G8929 Other chronic pain: Secondary | ICD-10-CM | POA: Diagnosis not present

## 2022-07-07 DIAGNOSIS — M25561 Pain in right knee: Secondary | ICD-10-CM | POA: Diagnosis not present

## 2022-07-07 NOTE — Therapy (Signed)
OUTPATIENT PHYSICAL THERAPY TREATMENT NOTE   Patient Name: Cheryl Cole MRN: [id number] DOB:[birth year], 21 y.o., female Today's Date: 07/07/2022  PCP: Lacie Draft, MD   REFERRING PROVIDER: Hiram Gash, MD   END OF SESSION:   PT End of Session - 07/07/22 1038     Visit Number 4    Number of Visits 17    Date for PT Re-Evaluation 08/22/22    Authorization Type [employer]    Authorization Time Period 07/01/2022 - 08/01/2022    Authorization - Visit Number 3    Authorization - Number of Visits 8    PT Start Time 4193    PT Stop Time 1125    PT Time Calculation (min) 50 min    Equipment Utilized During Treatment Right knee immobilizer    Activity Tolerance Patient tolerated treatment well    Behavior During Therapy WFL for tasks assessed/performed               Past Medical History:  Diagnosis Date   Torn meniscus    Past Surgical History:  Procedure Laterality Date   ANTERIOR CRUCIATE LIGAMENT REPAIR Right 06/25/2022   Procedure: RECONSTRUCTION ANTERIOR CRUCIATE LIGAMENT (ACL);  Surgeon: Hiram Gash, MD;  Location: [place];  Service: Orthopedics;  Laterality: Right;   KNEE ARTHROSCOPY WITH ANTERIOR CRUCIATE LIGAMENT (ACL) REPAIR Left 02/05/2021   Procedure: KNEE ARTHROSCOPY WITH ANTERIOR CRUCIATE LIGAMENT (ACL) REPAIR WITH AUTOGRAFT;  Surgeon: Hiram Gash, MD;  Location: [place];  Service: Orthopedics;  Laterality: Left;   KNEE ARTHROSCOPY WITH LATERAL MENISECTOMY  02/05/2021   Procedure: KNEE ARTHROSCOPY WITH LATERAL MENISECTOMY;  Surgeon: Hiram Gash, MD;  Location: [place];  Service: Orthopedics;;   KNEE ARTHROSCOPY WITH MEDIAL MENISECTOMY Left 02/05/2021   Procedure: KNEE ARTHROSCOPY WITH  MEDIAL MENISCUS REPAIR;  Surgeon: Hiram Gash, MD;  Location: [place];  Service: Orthopedics;  Laterality: Left;   KNEE ARTHROSCOPY WITH MEDIAL MENISECTOMY Right 06/25/2022   Procedure: KNEE  ARTHROSCOPY WITH MEDIAL MENISECTOMY;  Surgeon: Hiram Gash, MD;  Location: [place];  Service: Orthopedics;  Laterality: Right;   There are no problems to display for this patient.   REFERRING DIAG: Right knee arthroscopy with ACL reconstruction with BTB autograft and medical meniscus repair 8/24  THERAPY DIAG:  Chronic pain of right knee  Muscle weakness (generalized)  Other abnormalities of gait and mobility  Rationale for Evaluation and Treatment Rehabilitation  PERTINENT HISTORY: s/p right ACL reconstruction with BTB autograft, medial meniscectomy, lateral meniscus repair 06/25/2022  PRECAUTIONS: Knee  SUBJECTIVE: Patient reports she is doing well. She saw the surgeon who was pleased with her progress.  PAIN:  Are you having pain? 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   OBJECTIVE: (objective measures completed at initial evaluation unless otherwise dated) PATIENT SURVEYS:  LEFS: 17/80  EDEMA:  Circumferential: Right: 39 cm, Left: 37 cm    MUSCLE LENGTH: Hamstring, calf, and quad flexibility deficit  LOWER EXTREMITY ROM:   Active ROM Right eval Left eval Right 07/01/2022 Right 07/03/2022  Knee flexion 60 135 82 90 deg  Knee extension 5 lacking 8 hyper 1 hyper      PROM right knee 5-0-90 - 07/07/2022  LOWER EXTREMITY MMT:   Patient with good quad activation,  slight extension lag with SLR  07/01/2022: patient able to perform SLR without lag   MMT Right eval Left eval  Hip flexion 4- 4  Hip extension 4- 4  Hip abduction 4- 4  Knee flexion - 5  Knee extension - 5    FUNCTIONAL TESTS:  Not assessed   GAIT: Assistive device utilized: Crutches Level of assistance: Modified independence Comments: Right knee immobilizer  locked in extension, circumduction on right, forward trunk lean on crutches, flat foot strike     TODAY'S TREATMENT: [facility] Adult PT Treatment:                                                DATE: 07/07/2022 Therapeutic Exercise: Quad set x 10 with 5 sec hold Quad set with heel prop x 10 with 5 sec hold Hooklying ATKHE using strap for assist x 10 with 5 sec hold Seated knee flexion PROM Blood flow restriction Position and location of cuff: Supine and proximal thigh Limb occlusion pressure (mmHg): 200 mmHg Exercise pressure (mmHg): 140 mmHg (70%) Exercise prescription: 64,40,34,74, reps with 30-60 sec rest Exercise comment: SLR Active heel slide x 10 with 5 sec hold Blood flow restriction Position and location of cuff: Sidelying and proximal thigh Limb occlusion pressure (mmHg): 210 mmHg Exercise pressure (mmHg): 147 mmHg (70%) Exercise prescription: 25,95,63,87, reps with 30-60 sec rest Exercise comment: sidelying hip abduction Prone TKE x 10 with 5 sec hold Blood flow restriction Position and location of cuff: Prone and proximal thigh Limb occlusion pressure (mmHg): 200 mmHg Exercise pressure (mmHg): 140 mmHg (70%) Exercise prescription: 56,43,32,95, reps with 30-60 sec rest Exercise comment: prone hamstring curl Prone quad stretch with strap 3 x 20 sec Prone hip extension 2 x 20 Slant board calf stretch 3 x 20 sec Standing heel raises with knee brace on 3 x 20   [facility] Adult PT Treatment:                                                DATE: 07/03/2022 Therapeutic Exercise: Quad set x 10 with 5 sec hold Hooklying ATKHE using strap for assist x 10 with 5 sec hold Seated knee flexion PROM Blood flow restriction Position and location of cuff: Supine and proximal thigh Limb occlusion pressure (mmHg): 200 mmHg Exercise pressure (mmHg): 140 mmHg (70%) Exercise prescription: 18,84,16,60, reps with 30-60 sec rest Exercise comment: SLR Active heel slide x 10 with 5 sec hold Blood flow  restriction Position and location of cuff: Sidelying and proximal thigh Limb occlusion pressure (mmHg): 210 mmHg Exercise pressure (mmHg): 147 mmHg (70%) Exercise prescription: 63,01,60,10, reps with 30-60 sec rest Exercise comment: sidelying hip abduction Prone TKE x 10 with 5 sec hold Blood flow restriction Position and location of cuff: Prone and proximal thigh Limb occlusion pressure (mmHg): 200 mmHg Exercise pressure (mmHg): 140 mmHg (70%) Exercise prescription: 93,23,55,73, reps with 30-60 sec rest Exercise comment: prone hamstring curl  [facility] Adult PT Treatment:  DATE: 07/01/2022 Therapeutic Exercise: Quad set with heel prop 2 x 10 with 5 sec hold SLR without brace 2 x 10 Hooklying ATKHE using strap for assist 3 x 5 Prone ATKE with foot on bolster 2 x 10 Prone hamstring curl 2 x 10 Active heel slide 2 x 10  Longsitting ankle PF with blue 2 x 20 Sidelying hip abduction with brace on 2 x 15 Prone hip extension with brace 2 x 15 Sidelying hip adduction with brace on 2 x 15   PATIENT EDUCATION:  Education details: HEP Person educated: Patient Education method: Consulting civil engineer, Demonstration, Corporate treasurer cues, Verbal cues Education comprehension: verbalized understanding, returned demonstration, verbal cues required, tactile cues required, and needs further education   HOME EXERCISE PROGRAM: Access Code: [email or web address]     ASSESSMENT: CLINICAL IMPRESSION: Patient tolerated therapy well with no adverse effects. Her knee PROM is progressing well and she meet all milestones. She does continue to have difficulty perform active knee hyperextension. Continue with majority of session performing BFR for low load strengthening with good tolerance. No changes made to HEP this visit. Patient was encouraged to transition to single axillary crutch for ambulation. Patient would benefit from continued skilled PT to progress her mobility and strength in order to  reduce pain and maximize functional ability.     OBJECTIVE IMPAIRMENTS Abnormal gait, decreased activity tolerance, decreased balance, difficulty walking, decreased ROM, decreased strength, impaired flexibility, improper body mechanics, and pain.    ACTIVITY LIMITATIONS lifting, bending, standing, squatting, stairs, transfers, and locomotion level   PARTICIPATION LIMITATIONS: meal prep, cleaning, shopping, community activity, and occupation   PERSONAL FACTORS Past/current experiences are also affecting patient's functional outcome.      GOALS: Goals reviewed with patient? Yes   SHORT TERM GOALS: Target date: 07/25/2022    Patient will be I with initial HEP in order to progress with therapy. Baseline: HEP provided at eval Goal status: INITIAL   2.  Patient will perform SLR x 10 repetitions without extension lag in order to improve quad control with ambulation Baseline: good quad activation, slight extension lag with SLR, fatigues with repetitions 07/03/2022: patient able to perform SLR x 10 without lag Goal status: MET   3.  Patient will demonstrate normalize gait mechanics without AD in order to improve walking ability Baseline: patient using bilateral crutches and knee immobilizer locked in extension Goal status: INITIAL   4.  Patient will demonstrate right knee AROM 5-0-90 deg in order to  Baseline: right knee AROM 5-60 deg Goal status: INITIAL   5. Patient will report pain level </= 2/10 with walking or PT related activities in order to allow progress with therapy and reduce functional limitations.            Baseline: 4/10 Goal Status: INITIAL   LONG TERM GOALS: Target date: 08/22/2022    Patient will be I with final HEP to maintain progress from PT. Baseline: HEP provided at eval Goal status: INITIAL   2.  Patient will report >/= 56/80 on LEFS in order to indicate improved functional ability without progression to running or jumping tasks Baseline: 17/80 Goal status:  INITIAL   3.  Patient will be able to demonstrate a squat without deviation or pain in order to progress strength and improve ability to perform household tasks Baseline: unable at eval Goal status: INITIAL   4.  Patient will demonstrate full right knee AROM in order to improve functional mobility and reduce limitations with movement Baseline: knee AROM 5-60 deg at  evaluation Goal status: INITIAL     PLAN: PT FREQUENCY: 1-2x/week   PT DURATION: 8 weeks   PLANNED INTERVENTIONS: Therapeutic exercises, Therapeutic activity, Neuromuscular re-education, Balance training, Gait training, Patient/Family education, Self Care, Joint mobilization, Joint manipulation, Aquatic Therapy, Dry Needling, Electrical stimulation, Cryotherapy, Moist heat, Taping, Vasopneumatic device, Manual therapy, and Re-evaluation   PLAN FOR NEXT SESSION: Review HEP and progress PRN, knee extension ROM, quad activation progressing to SLR, patellar mobs, hip strengthening, gait training, vaso   Hilda Blades, PT, DPT, LAT, ATC 07/07/22  11:31 AM Phone: [phone] Fax: [phone]

## 2022-07-09 NOTE — Therapy (Signed)
OUTPATIENT PHYSICAL THERAPY TREATMENT NOTE   Patient Name: Cheryl Cole MRN: [id number] DOB:12-14-2000, 21 y.o., female Today's Date: 07/10/2022  PCP: Lacie Draft, MD   REFERRING PROVIDER: Hiram Gash, MD   END OF SESSION:   PT End of Session - 07/10/22 0704     Visit Number 5    Number of Visits 17    Date for PT Re-Evaluation 08/22/22    Authorization Type [employer]    Authorization Time Period 07/01/2022 - 08/01/2022    Authorization - Visit Number 3    Authorization - Number of Visits 8    PT Start Time 0700    PT Stop Time 0745    PT Time Calculation (min) 45 min    Equipment Utilized During Treatment Right knee immobilizer    Activity Tolerance Patient tolerated treatment well    Behavior During Therapy [facility] for tasks assessed/performed                Past Medical History:  Diagnosis Date   Torn meniscus    Past Surgical History:  Procedure Laterality Date   ANTERIOR CRUCIATE LIGAMENT REPAIR Right 06/25/2022   Procedure: RECONSTRUCTION ANTERIOR CRUCIATE LIGAMENT (ACL);  Surgeon: Hiram Gash, MD;  Location: [place];  Service: Orthopedics;  Laterality: Right;   KNEE ARTHROSCOPY WITH ANTERIOR CRUCIATE LIGAMENT (ACL) REPAIR Left 02/05/2021   Procedure: KNEE ARTHROSCOPY WITH ANTERIOR CRUCIATE LIGAMENT (ACL) REPAIR WITH AUTOGRAFT;  Surgeon: Hiram Gash, MD;  Location: [place];  Service: Orthopedics;  Laterality: Left;   KNEE ARTHROSCOPY WITH LATERAL MENISECTOMY  02/05/2021   Procedure: KNEE ARTHROSCOPY WITH LATERAL MENISECTOMY;  Surgeon: Hiram Gash, MD;  Location: [place];  Service: Orthopedics;;   KNEE ARTHROSCOPY WITH MEDIAL MENISECTOMY Left 02/05/2021   Procedure: KNEE ARTHROSCOPY WITH  MEDIAL MENISCUS REPAIR;  Surgeon: Hiram Gash, MD;  Location: [place];  Service: Orthopedics;  Laterality: Left;   KNEE ARTHROSCOPY WITH MEDIAL MENISECTOMY Right 06/25/2022   Procedure: KNEE  ARTHROSCOPY WITH MEDIAL MENISECTOMY;  Surgeon: Hiram Gash, MD;  Location: [place];  Service: Orthopedics;  Laterality: Right;   There are no problems to display for this patient.   REFERRING DIAG: Right knee arthroscopy with ACL reconstruction with BTB autograft and medical meniscus repair 8/24  THERAPY DIAG:  Chronic pain of right knee  Muscle weakness (generalized)  Other abnormalities of gait and mobility  Rationale for Evaluation and Treatment Rehabilitation  PERTINENT HISTORY: s/p right ACL reconstruction with BTB autograft, medial meniscectomy, lateral meniscus repair 06/25/2022  PRECAUTIONS: Knee  SUBJECTIVE: Patient reports she continues to do well. Her knee feels stronger.  PAIN:  Are you having pain? 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   OBJECTIVE: (objective measures completed at initial evaluation unless otherwise dated) PATIENT SURVEYS:  LEFS: 17/80  EDEMA:  Circumferential: Right: 39 cm, Left: 37 cm    MUSCLE LENGTH: Hamstring, calf, and quad flexibility deficit  LOWER EXTREMITY ROM:   Active ROM Right eval Left eval Right 07/01/2022 Right 07/03/2022 Right 07/10/2022  Knee flexion 60 135 82 90 deg 90 deg  Knee extension 5 lacking 8 hyper 1 hyper  3 hyper     PROM right knee 5-0-90 - 07/07/2022  LOWER EXTREMITY MMT:   Patient with good  quad activation, slight extension lag with SLR  07/01/2022: patient able to perform SLR without lag   MMT Right eval Left eval  Hip flexion 4- 4  Hip extension 4- 4  Hip abduction 4- 4  Knee flexion - 5  Knee extension - 5    FUNCTIONAL TESTS:  Not assessed   GAIT: Assistive device utilized: Crutches Level of assistance: Modified independence Comments: Right knee  immobilizer locked in extension, circumduction on right, forward trunk lean on crutches, flat foot strike     TODAY'S TREATMENT: [facility] Adult PT Treatment:                                                DATE: 07/10/2022 Therapeutic Exercise: Quad set with heel prop x 10 with 5 sec hold Hooklying ATKHE using strap for assist x 10 with 5 sec hold Heel slide with strap 10 x 5 sec Active heel slide x 10 with 5 sec hold Blood flow restriction Position and location of cuff: Supine and proximal thigh Limb occlusion pressure (mmHg): 200 mmHg Exercise pressure (mmHg): 140 mmHg (70%) Exercise prescription: 81,44,81,85, reps with 30-60 sec rest Exercise comment: SLR Blood flow restriction Position and location of cuff: Prone and proximal thigh Limb occlusion pressure (mmHg): 200 mmHg Exercise pressure (mmHg): 140 mmHg (70%) Exercise prescription: 63,14,97,02, reps with 30-60 sec rest Exercise comment: prone hamstring curl Sidelying hip abduction with green at knees 3 x 15 each Standing heel raises with knee brace on 3 x 20 Gait training: Brace unlocked to 50 deg, single axillary crutch  Focus on bend knee with swing and heel toe gait patter Neuro Reed: SLS 3 x 30 sec each   [facility] Adult PT Treatment:                                                DATE: 07/07/2022 Therapeutic Exercise: Quad set x 10 with 5 sec hold Quad set with heel prop x 10 with 5 sec hold Hooklying ATKHE using strap for assist x 10 with 5 sec hold Seated knee flexion PROM Blood flow restriction Position and location of cuff: Supine and proximal thigh Limb occlusion pressure (mmHg): 200 mmHg Exercise pressure (mmHg): 140 mmHg (70%) Exercise prescription: 63,78,58,85, reps with 30-60 sec rest Exercise comment: SLR Active heel slide x 10 with 5 sec hold Blood flow restriction Position and location of cuff: Sidelying and proximal thigh Limb occlusion pressure (mmHg): 210 mmHg Exercise pressure (mmHg): 147 mmHg  (70%) Exercise prescription: 02,77,41,28, reps with 30-60 sec rest Exercise comment: sidelying hip abduction Prone TKE x 10 with 5 sec hold Blood flow restriction Position and location of cuff: Prone and proximal thigh Limb occlusion pressure (mmHg): 200 mmHg Exercise pressure (mmHg): 140 mmHg (70%) Exercise prescription: 78,67,67,20, reps with 30-60 sec rest Exercise comment: prone hamstring curl Prone quad stretch with strap 3 x 20 sec Prone hip extension 2 x 20 Slant board calf stretch 3 x 20 sec Standing heel raises with knee brace on 3 x 20  [facility] Adult PT Treatment:  DATE: 07/03/2022 Therapeutic Exercise: Quad set x 10 with 5 sec hold Hooklying ATKHE using strap for assist x 10 with 5 sec hold Seated knee flexion PROM Blood flow restriction Position and location of cuff: Supine and proximal thigh Limb occlusion pressure (mmHg): 200 mmHg Exercise pressure (mmHg): 140 mmHg (70%) Exercise prescription: 77,82,42,35, reps with 30-60 sec rest Exercise comment: SLR Active heel slide x 10 with 5 sec hold Blood flow restriction Position and location of cuff: Sidelying and proximal thigh Limb occlusion pressure (mmHg): 210 mmHg Exercise pressure (mmHg): 147 mmHg (70%) Exercise prescription: 36,14,43,15, reps with 30-60 sec rest Exercise comment: sidelying hip abduction Prone TKE x 10 with 5 sec hold Blood flow restriction Position and location of cuff: Prone and proximal thigh Limb occlusion pressure (mmHg): 200 mmHg Exercise pressure (mmHg): 140 mmHg (70%) Exercise prescription: 40,08,67,61, reps with 30-60 sec rest Exercise comment: prone hamstring curl   PATIENT EDUCATION:  Education details: HEP, unlocking brace for ambulation Person educated: Patient Education method: Explanation, Demonstration, Tactile cues, Verbal cues Education comprehension: verbalized understanding, returned demonstration, verbal cues required, tactile cues  required, and needs further education   HOME EXERCISE PROGRAM: Access Code: [email or web address]     ASSESSMENT: CLINICAL IMPRESSION: Patient tolerated therapy well with no adverse effects. She demonstrates improved active knee hyperextension this visit and continues to progress with strengthening and knee control. Therapy continues to focus on progressing strength and initiated stability training and gait training with brace unlocked and single axillary crutch this visit. She does require cueing for proper knee flexion with gait but able to perform correctly following cues. No changes made to HEP this visit. Patient would benefit from continued skilled PT to progress her mobility and strength in order to reduce pain and maximize functional ability.     OBJECTIVE IMPAIRMENTS Abnormal gait, decreased activity tolerance, decreased balance, difficulty walking, decreased ROM, decreased strength, impaired flexibility, improper body mechanics, and pain.    ACTIVITY LIMITATIONS lifting, bending, standing, squatting, stairs, transfers, and locomotion level   PARTICIPATION LIMITATIONS: meal prep, cleaning, shopping, community activity, and occupation   PERSONAL FACTORS Past/current experiences are also affecting patient's functional outcome.      GOALS: Goals reviewed with patient? Yes   SHORT TERM GOALS: Target date: 07/25/2022    Patient will be I with initial HEP in order to progress with therapy. Baseline: HEP provided at eval Goal status: INITIAL   2.  Patient will perform SLR x 10 repetitions without extension lag in order to improve quad control with ambulation Baseline: good quad activation, slight extension lag with SLR, fatigues with repetitions 07/03/2022: patient able to perform SLR x 10 without lag Goal status: MET   3.  Patient will demonstrate normalize gait mechanics without AD in order to improve walking ability Baseline: patient using bilateral crutches and knee immobilizer locked in  extension Goal status: INITIAL   4.  Patient will demonstrate right knee AROM 5-0-90 deg in order to  Baseline: right knee AROM 5-60 deg Goal status: INITIAL   5. Patient will report pain level </= 2/10 with walking or PT related activities in order to allow progress with therapy and reduce functional limitations.            Baseline: 4/10 Goal Status: INITIAL   LONG TERM GOALS: Target date: 08/22/2022    Patient will be I with final HEP to maintain progress from PT. Baseline: HEP provided at eval Goal status: INITIAL   2.  Patient will report >/= 56/80 on LEFS  in order to indicate improved functional ability without progression to running or jumping tasks Baseline: 17/80 Goal status: INITIAL   3.  Patient will be able to demonstrate a squat without deviation or pain in order to progress strength and improve ability to perform household tasks Baseline: unable at eval Goal status: INITIAL   4.  Patient will demonstrate full right knee AROM in order to improve functional mobility and reduce limitations with movement Baseline: knee AROM 5-60 deg at evaluation Goal status: INITIAL     PLAN: PT FREQUENCY: 1-2x/week   PT DURATION: 8 weeks   PLANNED INTERVENTIONS: Therapeutic exercises, Therapeutic activity, Neuromuscular re-education, Balance training, Gait training, Patient/Family education, Self Care, Joint mobilization, Joint manipulation, Aquatic Therapy, Dry Needling, Electrical stimulation, Cryotherapy, Moist heat, Taping, Vasopneumatic device, Manual therapy, and Re-evaluation   PLAN FOR NEXT SESSION: Review HEP and progress PRN, knee extension ROM, quad activation progressing to SLR, patellar mobs, hip strengthening, gait training, vaso   Hilda Blades, PT, DPT, LAT, ATC 07/10/22  7:47 AM Phone: [phone] Fax: [phone]

## 2022-07-10 ENCOUNTER — Other Ambulatory Visit: Payer: Self-pay

## 2022-07-10 ENCOUNTER — Encounter: Payer: Self-pay | Admitting: Physical Therapy

## 2022-07-10 ENCOUNTER — Ambulatory Visit: Payer: 59 | Admitting: Physical Therapy

## 2022-07-10 DIAGNOSIS — G8929 Other chronic pain: Secondary | ICD-10-CM

## 2022-07-10 DIAGNOSIS — M6281 Muscle weakness (generalized): Secondary | ICD-10-CM

## 2022-07-10 DIAGNOSIS — R2689 Other abnormalities of gait and mobility: Secondary | ICD-10-CM

## 2022-07-10 DIAGNOSIS — M25561 Pain in right knee: Secondary | ICD-10-CM | POA: Diagnosis not present

## 2022-07-10 NOTE — Therapy (Signed)
OUTPATIENT PHYSICAL THERAPY TREATMENT NOTE   Patient Name: Cheryl Cole MRN: [id number] DOB:02-May-2001, 21 y.o., female Today's Date: 07/10/2022  PCP: Lacie Draft, MD   REFERRING PROVIDER: Hiram Gash, MD   END OF SESSION:        Past Medical History:  Diagnosis Date   Torn meniscus    Past Surgical History:  Procedure Laterality Date   ANTERIOR CRUCIATE LIGAMENT REPAIR Right 06/25/2022   Procedure: RECONSTRUCTION ANTERIOR CRUCIATE LIGAMENT (ACL);  Surgeon: Hiram Gash, MD;  Location: [place];  Service: Orthopedics;  Laterality: Right;   KNEE ARTHROSCOPY WITH ANTERIOR CRUCIATE LIGAMENT (ACL) REPAIR Left 02/05/2021   Procedure: KNEE ARTHROSCOPY WITH ANTERIOR CRUCIATE LIGAMENT (ACL) REPAIR WITH AUTOGRAFT;  Surgeon: Hiram Gash, MD;  Location: [place];  Service: Orthopedics;  Laterality: Left;   KNEE ARTHROSCOPY WITH LATERAL MENISECTOMY  02/05/2021   Procedure: KNEE ARTHROSCOPY WITH LATERAL MENISECTOMY;  Surgeon: Hiram Gash, MD;  Location: [place];  Service: Orthopedics;;   KNEE ARTHROSCOPY WITH MEDIAL MENISECTOMY Left 02/05/2021   Procedure: KNEE ARTHROSCOPY WITH  MEDIAL MENISCUS REPAIR;  Surgeon: Hiram Gash, MD;  Location: [place];  Service: Orthopedics;  Laterality: Left;   KNEE ARTHROSCOPY WITH MEDIAL MENISECTOMY Right 06/25/2022   Procedure: KNEE ARTHROSCOPY WITH MEDIAL MENISECTOMY;  Surgeon: Hiram Gash, MD;  Location: [place];  Service: Orthopedics;  Laterality: Right;   There are no problems to display for this patient.   REFERRING DIAG: Right knee arthroscopy with ACL reconstruction with BTB autograft and medical meniscus repair 8/24  THERAPY DIAG:  No diagnosis found.  Rationale for Evaluation and Treatment Rehabilitation  PERTINENT HISTORY: s/p right ACL reconstruction with BTB autograft, medial meniscectomy, lateral meniscus repair  06/25/2022  PRECAUTIONS: Knee  SUBJECTIVE: Patient reports she continues to do well. Her knee feels stronger.  PAIN:  Are you having pain? 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   OBJECTIVE: (objective measures completed at initial evaluation unless otherwise dated) PATIENT SURVEYS:  LEFS: 17/80  EDEMA:  Circumferential: Right: 39 cm, Left: 37 cm    MUSCLE LENGTH: Hamstring, calf, and quad flexibility deficit  LOWER EXTREMITY ROM:   Active ROM Right eval Left eval Right 07/01/2022 Right 07/03/2022 Right 07/10/2022  Knee flexion 60 135 82 90 deg 90 deg  Knee extension 5 lacking 8 hyper 1 hyper  3 hyper     PROM right knee 5-0-90 - 07/07/2022  LOWER EXTREMITY MMT:   Patient with good quad activation, slight extension lag with SLR  07/01/2022: patient able to perform SLR without lag   MMT Right eval Left eval  Hip flexion 4- 4  Hip extension 4- 4  Hip abduction 4- 4  Knee flexion - 5  Knee extension - 5    FUNCTIONAL TESTS:  Not assessed   GAIT: Assistive device utilized: Crutches Level of assistance: Modified independence Comments: Right knee immobilizer locked in extension, circumduction on right, forward trunk lean on crutches, flat foot strike     TODAY'S TREATMENT: [facility] Adult PT Treatment:  DATE: 07/13/2022 Therapeutic Exercise: Quad set with heel prop x 10 with 5 sec hold Hooklying ATKHE using strap for assist x 10 with 5 sec hold Heel slide with strap 10 x 5 sec Active heel slide x 10 with 5 sec hold Blood flow restriction Position and location of cuff: Supine and proximal thigh Limb occlusion pressure (mmHg): 200 mmHg Exercise pressure (mmHg): 140 mmHg (70%) Exercise prescription:  21,19,41,74, reps with 30-60 sec rest Exercise comment: SLR Blood flow restriction Position and location of cuff: Prone and proximal thigh Limb occlusion pressure (mmHg): 200 mmHg Exercise pressure (mmHg): 140 mmHg (70%) Exercise prescription: 08,14,48,18, reps with 30-60 sec rest Exercise comment: prone hamstring curl Sidelying hip abduction with green at knees 3 x 15 each Standing heel raises with knee brace on 3 x 20 Gait training: Brace unlocked to 50 deg, single axillary crutch  Focus on bend knee with swing and heel toe gait patter Neuro Reed: SLS 3 x 30 sec each   [facility] Adult PT Treatment:                                                DATE: 07/10/2022 Therapeutic Exercise: Quad set with heel prop x 10 with 5 sec hold Hooklying ATKHE using strap for assist x 10 with 5 sec hold Heel slide with strap 10 x 5 sec Active heel slide x 10 with 5 sec hold Blood flow restriction Position and location of cuff: Supine and proximal thigh Limb occlusion pressure (mmHg): 200 mmHg Exercise pressure (mmHg): 140 mmHg (70%) Exercise prescription: 56,31,49,70, reps with 30-60 sec rest Exercise comment: SLR Blood flow restriction Position and location of cuff: Prone and proximal thigh Limb occlusion pressure (mmHg): 200 mmHg Exercise pressure (mmHg): 140 mmHg (70%) Exercise prescription: 26,37,85,88, reps with 30-60 sec rest Exercise comment: prone hamstring curl Sidelying hip abduction with green at knees 3 x 15 each Standing heel raises with knee brace on 3 x 20 Gait training: Brace unlocked to 50 deg, single axillary crutch  Focus on bend knee with swing and heel toe gait patter Neuro Reed: SLS 3 x 30 sec each  [facility] Adult PT Treatment:                                                DATE: 07/07/2022 Therapeutic Exercise: Quad set x 10 with 5 sec hold Quad set with heel prop x 10 with 5 sec hold Hooklying ATKHE using strap for assist x 10 with 5 sec hold Seated knee flexion PROM Blood  flow restriction Position and location of cuff: Supine and proximal thigh Limb occlusion pressure (mmHg): 200 mmHg Exercise pressure (mmHg): 140 mmHg (70%) Exercise prescription: 50,27,74,12, reps with 30-60 sec rest Exercise comment: SLR Active heel slide x 10 with 5 sec hold Blood flow restriction Position and location of cuff: Sidelying and proximal thigh Limb occlusion pressure (mmHg): 210 mmHg Exercise pressure (mmHg): 147 mmHg (70%) Exercise prescription: 87,86,76,72, reps with 30-60 sec rest Exercise comment: sidelying hip abduction Prone TKE x 10 with 5 sec hold Blood flow restriction Position and location of cuff: Prone and proximal thigh Limb occlusion pressure (mmHg): 200 mmHg Exercise pressure (mmHg): 140 mmHg (70%) Exercise prescription: 09,47,09,62, reps with 30-60 sec  rest Exercise comment: prone hamstring curl Prone quad stretch with strap 3 x 20 sec Prone hip extension 2 x 20 Slant board calf stretch 3 x 20 sec Standing heel raises with knee brace on 3 x 20  PATIENT EDUCATION:  Education details: HEP Person educated: Patient Education method: Consulting civil engineer, Demonstration, Corporate treasurer cues, Verbal cues Education comprehension: verbalized understanding, returned demonstration, verbal cues required, tactile cues required, and needs further education   HOME EXERCISE PROGRAM: Access Code: [email or web address]     ASSESSMENT: CLINICAL IMPRESSION: Patient tolerated therapy well with no adverse effects. *** Patient would benefit from continued skilled PT to progress her mobility and strength in order to reduce pain and maximize functional ability.  She demonstrates improved active knee hyperextension this visit and continues to progress with strengthening and knee control. Therapy continues to focus on progressing strength and initiated stability training and gait training with brace unlocked and single axillary crutch this visit. She does require cueing for proper knee flexion with  gait but able to perform correctly following cues. No changes made to HEP this visit.      OBJECTIVE IMPAIRMENTS Abnormal gait, decreased activity tolerance, decreased balance, difficulty walking, decreased ROM, decreased strength, impaired flexibility, improper body mechanics, and pain.    ACTIVITY LIMITATIONS lifting, bending, standing, squatting, stairs, transfers, and locomotion level   PARTICIPATION LIMITATIONS: meal prep, cleaning, shopping, community activity, and occupation   PERSONAL FACTORS Past/current experiences are also affecting patient's functional outcome.      GOALS: Goals reviewed with patient? Yes   SHORT TERM GOALS: Target date: 07/25/2022    Patient will be I with initial HEP in order to progress with therapy. Baseline: HEP provided at eval Goal status: INITIAL   2.  Patient will perform SLR x 10 repetitions without extension lag in order to improve quad control with ambulation Baseline: good quad activation, slight extension lag with SLR, fatigues with repetitions 07/03/2022: patient able to perform SLR x 10 without lag Goal status: MET   3.  Patient will demonstrate normalize gait mechanics without AD in order to improve walking ability Baseline: patient using bilateral crutches and knee immobilizer locked in extension Goal status: INITIAL   4.  Patient will demonstrate right knee AROM 5-0-90 deg in order to  Baseline: right knee AROM 5-60 deg Goal status: INITIAL   5. Patient will report pain level </= 2/10 with walking or PT related activities in order to allow progress with therapy and reduce functional limitations.            Baseline: 4/10 Goal Status: INITIAL   LONG TERM GOALS: Target date: 08/22/2022    Patient will be I with final HEP to maintain progress from PT. Baseline: HEP provided at eval Goal status: INITIAL   2.  Patient will report >/= 56/80 on LEFS in order to indicate improved functional ability without progression to running or  jumping tasks Baseline: 17/80 Goal status: INITIAL   3.  Patient will be able to demonstrate a squat without deviation or pain in order to progress strength and improve ability to perform household tasks Baseline: unable at eval Goal status: INITIAL   4.  Patient will demonstrate full right knee AROM in order to improve functional mobility and reduce limitations with movement Baseline: knee AROM 5-60 deg at evaluation Goal status: INITIAL     PLAN: PT FREQUENCY: 1-2x/week   PT DURATION: 8 weeks   PLANNED INTERVENTIONS: Therapeutic exercises, Therapeutic activity, Neuromuscular re-education, Balance training, Gait training, Patient/Family education, Self  Care, Joint mobilization, Joint manipulation, Aquatic Therapy, Dry Needling, Electrical stimulation, Cryotherapy, Moist heat, Taping, Vasopneumatic device, Manual therapy, and Re-evaluation   PLAN FOR NEXT SESSION: Review HEP and progress PRN, knee extension ROM, quad activation progressing to SLR, patellar mobs, hip strengthening, gait training, vaso   Hilda Blades, PT, DPT, LAT, ATC 07/10/22  1:23 PM Phone: [phone] Fax: [phone]

## 2022-07-13 ENCOUNTER — Other Ambulatory Visit: Payer: Self-pay

## 2022-07-13 ENCOUNTER — Ambulatory Visit: Payer: 59 | Admitting: Physical Therapy

## 2022-07-13 ENCOUNTER — Encounter: Payer: Self-pay | Admitting: Physical Therapy

## 2022-07-13 DIAGNOSIS — R2689 Other abnormalities of gait and mobility: Secondary | ICD-10-CM | POA: Diagnosis not present

## 2022-07-13 DIAGNOSIS — M25561 Pain in right knee: Secondary | ICD-10-CM | POA: Diagnosis not present

## 2022-07-13 DIAGNOSIS — G8929 Other chronic pain: Secondary | ICD-10-CM

## 2022-07-13 DIAGNOSIS — M6281 Muscle weakness (generalized): Secondary | ICD-10-CM

## 2022-07-14 NOTE — Therapy (Signed)
OUTPATIENT PHYSICAL THERAPY TREATMENT NOTE   Patient Name: Cheryl Cole MRN: [id number] DOB:02/11/2001, 21 y.o., female Today's Date: 07/15/2022  PCP: Lacie Draft, MD   REFERRING PROVIDER: Hiram Gash, MD   END OF SESSION:   PT End of Session - 07/15/22 0929     Visit Number 7    Number of Visits 17    Date for PT Re-Evaluation 08/22/22    Authorization Type [employer]    Authorization Time Period 07/01/2022 - 08/01/2022    Authorization - Visit Number 5    Authorization - Number of Visits 8    PT Start Time 0915    PT Stop Time 1000    PT Time Calculation (min) 45 min    Equipment Utilized During Treatment Right knee immobilizer    Activity Tolerance Patient tolerated treatment well    Behavior During Therapy [facility] for tasks assessed/performed                  Past Medical History:  Diagnosis Date   Torn meniscus    Past Surgical History:  Procedure Laterality Date   ANTERIOR CRUCIATE LIGAMENT REPAIR Right 06/25/2022   Procedure: RECONSTRUCTION ANTERIOR CRUCIATE LIGAMENT (ACL);  Surgeon: Hiram Gash, MD;  Location: [place];  Service: Orthopedics;  Laterality: Right;   KNEE ARTHROSCOPY WITH ANTERIOR CRUCIATE LIGAMENT (ACL) REPAIR Left 02/05/2021   Procedure: KNEE ARTHROSCOPY WITH ANTERIOR CRUCIATE LIGAMENT (ACL) REPAIR WITH AUTOGRAFT;  Surgeon: Hiram Gash, MD;  Location: [place];  Service: Orthopedics;  Laterality: Left;   KNEE ARTHROSCOPY WITH LATERAL MENISECTOMY  02/05/2021   Procedure: KNEE ARTHROSCOPY WITH LATERAL MENISECTOMY;  Surgeon: Hiram Gash, MD;  Location: [place];  Service: Orthopedics;;   KNEE ARTHROSCOPY WITH MEDIAL MENISECTOMY Left 02/05/2021   Procedure: KNEE ARTHROSCOPY WITH  MEDIAL MENISCUS REPAIR;  Surgeon: Hiram Gash, MD;  Location: [place];  Service: Orthopedics;  Laterality: Left;   KNEE ARTHROSCOPY WITH MEDIAL MENISECTOMY Right 06/25/2022   Procedure: KNEE  ARTHROSCOPY WITH MEDIAL MENISECTOMY;  Surgeon: Hiram Gash, MD;  Location: [place];  Service: Orthopedics;  Laterality: Right;   There are no problems to display for this patient.   REFERRING DIAG: Right knee arthroscopy with ACL reconstruction with BTB autograft and medical meniscus repair 8/24  THERAPY DIAG:  Chronic pain of right knee  Muscle weakness (generalized)  Other abnormalities of gait and mobility  Rationale for Evaluation and Treatment Rehabilitation  PERTINENT HISTORY: s/p right ACL reconstruction with BTB autograft, medial meniscectomy, lateral meniscus repair 06/25/2022  PRECAUTIONS: Knee   SUBJECTIVE: Patient reports she is doing well, she has progressed off crutches and knee feels good.  PAIN:  Are you having pain? 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   OBJECTIVE: (objective measures completed at initial evaluation unless otherwise dated) PATIENT SURVEYS:  LEFS: 17/80  EDEMA:  Circumferential: Right: 39 cm, Left: 37 cm    MUSCLE LENGTH: Hamstring, calf, and quad flexibility deficit  LOWER EXTREMITY ROM:   Active ROM Right eval Left eval Right 07/03/2022 Right 07/10/2022 Right 07/15/2022  Knee flexion 60 135 90 deg 90 deg 90 deg  Knee extension 5 lacking 8 hyper  3 hyper 3 hyper     PROM right knee 5-0-90 - 07/07/2022  LOWER EXTREMITY MMT:   Patient with good quad activation, slight extension lag with SLR  07/01/2022: patient able to perform SLR without lag   MMT Right eval Left eval  Hip flexion 4- 4  Hip extension 4- 4  Hip abduction 4- 4  Knee flexion - 5  Knee extension - 5    FUNCTIONAL TESTS:  Not assessed   GAIT: - 07/13/2022 Assistive device utilized: None Level of assistance: Modified  independence Comments: Right knee immobilizer unlocked to 50 deg, grossly WFL     TODAY'S TREATMENT: [facility] Adult PT Treatment:                                                DATE: 07/15/2022 Therapeutic Exercise: Quad set with heel prop x 10 with 5 sec hold Hooklying ATKHE using strap for assist x 10 with 5 sec hold Quad set with focus on popping heel up x 10 with 5 sec hold Quad set with small towel roll under knee x 10 with 5 sec hold Active heel slide x 10 with 5 sec hold Blood flow restriction Position and location of cuff: Supine and proximal thigh Limb occlusion pressure (mmHg): 200 mmHg Exercise pressure (mmHg): 160 mmHg (80%) Exercise prescription: 14,97,02,63, reps with 30-60 sec rest Exercise comment: SLR Sidelying hip abduction with green at knees 3 x 15 each Standing TKE with green band 2 x 20 Standing heel raises with knee brace on 2 x 20 Wall squat to approx 45 deg knee flexion with knee brace on 2 x 10 Lateral 2" heel tap 2 x 10 Seated quad set for demo to perform at home Neuro Reed: SLS 3 x 30 sec each   [facility] Adult PT Treatment:                                                DATE: 07/13/2022 Therapeutic Exercise: Quad set with heel prop x 10 with 5 sec hold Hooklying ATKHE using strap for assist x 10 with 5 sec hold Quad set with focus on popping heel up x 10 with 5 sec hold Active heel slide x 10 with 5 sec hold Blood flow restriction Position and location of cuff: Supine and proximal thigh Limb occlusion pressure (mmHg): 200 mmHg Exercise pressure (mmHg): 140 mmHg (70%) Exercise prescription: 78,58,85,02, reps with 30-60 sec rest Exercise comment: SLR Blood flow restriction Position and location of cuff: Prone and proximal thigh Limb occlusion pressure (mmHg): 200 mmHg Exercise pressure (mmHg): 140 mmHg (70%) Exercise prescription: 77,41,28,78, reps with 30-60 sec rest Exercise comment: prone hamstring curl Standing calf stretch at wall 2 x 30  sec Standing heel raises with knee brace on 2 x 20 Standing TKE with green band and knee brace on  2 x 15 Wall squat to approx 45 deg knee flexion with knee brace on 3 x 10 Neuro Reed: SLS 3 x 30 sec each  [facility] Adult PT Treatment:                                                DATE: 07/10/2022 Therapeutic Exercise:  Quad set with heel prop x 10 with 5 sec hold Hooklying ATKHE using strap for assist x 10 with 5 sec hold Heel slide with strap 10 x 5 sec Active heel slide x 10 with 5 sec hold Blood flow restriction Position and location of cuff: Supine and proximal thigh Limb occlusion pressure (mmHg): 200 mmHg Exercise pressure (mmHg): 140 mmHg (70%) Exercise prescription: 75,10,25,85, reps with 30-60 sec rest Exercise comment: SLR Blood flow restriction Position and location of cuff: Prone and proximal thigh Limb occlusion pressure (mmHg): 200 mmHg Exercise pressure (mmHg): 140 mmHg (70%) Exercise prescription: 27,78,24,23, reps with 30-60 sec rest Exercise comment: prone hamstring curl Sidelying hip abduction with green at knees 3 x 15 each Standing heel raises with knee brace on 3 x 20 Gait training: Brace unlocked to 50 deg, single axillary crutch  Focus on bend knee with swing and heel toe gait patter Neuro Reed: SLS 3 x 30 sec each  PATIENT EDUCATION:  Education details: HEP Person educated: Patient Education method: Consulting civil engineer, Media planner, Corporate treasurer cues, Verbal cues Education comprehension: verbalized understanding, returned demonstration, verbal cues required, tactile cues required, and needs further education   HOME EXERCISE PROGRAM: Access Code: [email or web address]     ASSESSMENT: CLINICAL IMPRESSION: Patient tolerated therapy well with no adverse effects. Therapy continues to progress her strengthening with good tolerance. Unlocked her brace to 90 deg flexion this visit. She was able to perform standing lateral heel tap for quad strengthening and continues to demonstrate  good quad control. No changes made to her HEP this visit. Patient would benefit from continued skilled PT to progress her mobility and strength in order to reduce pain and maximize functional ability.     OBJECTIVE IMPAIRMENTS Abnormal gait, decreased activity tolerance, decreased balance, difficulty walking, decreased ROM, decreased strength, impaired flexibility, improper body mechanics, and pain.    ACTIVITY LIMITATIONS lifting, bending, standing, squatting, stairs, transfers, and locomotion level   PARTICIPATION LIMITATIONS: meal prep, cleaning, shopping, community activity, and occupation   PERSONAL FACTORS Past/current experiences are also affecting patient's functional outcome.      GOALS: Goals reviewed with patient? Yes   SHORT TERM GOALS: Target date: 07/25/2022    Patient will be I with initial HEP in order to progress with therapy. Baseline: HEP provided at eval Goal status: INITIAL   2.  Patient will perform SLR x 10 repetitions without extension lag in order to improve quad control with ambulation Baseline: good quad activation, slight extension lag with SLR, fatigues with repetitions 07/03/2022: patient able to perform SLR x 10 without lag Goal status: MET   3.  Patient will demonstrate normalize gait mechanics without AD in order to improve walking ability Baseline: patient using bilateral crutches and knee immobilizer locked in extension Goal status: INITIAL   4.  Patient will demonstrate right knee AROM 5-0-90 deg in order to  Baseline: right knee AROM 5-60 deg Goal status: INITIAL   5. Patient will report pain level </= 2/10 with walking or PT related activities in order to allow progress with therapy and reduce functional limitations.            Baseline: 4/10 Goal Status: INITIAL   LONG TERM GOALS: Target date: 08/22/2022    Patient will be I with final HEP to maintain progress from PT. Baseline: HEP provided at eval Goal status: INITIAL   2.  Patient  will report >/= 56/80 on LEFS in order to indicate improved functional ability without progression to running or jumping tasks Baseline:  17/80 Goal status: INITIAL   3.  Patient will be able to demonstrate a squat without deviation or pain in order to progress strength and improve ability to perform household tasks Baseline: unable at eval Goal status: INITIAL   4.  Patient will demonstrate full right knee AROM in order to improve functional mobility and reduce limitations with movement Baseline: knee AROM 5-60 deg at evaluation Goal status: INITIAL     PLAN: PT FREQUENCY: 1-2x/week   PT DURATION: 8 weeks   PLANNED INTERVENTIONS: Therapeutic exercises, Therapeutic activity, Neuromuscular re-education, Balance training, Gait training, Patient/Family education, Self Care, Joint mobilization, Joint manipulation, Aquatic Therapy, Dry Needling, Electrical stimulation, Cryotherapy, Moist heat, Taping, Vasopneumatic device, Manual therapy, and Re-evaluation   PLAN FOR NEXT SESSION: Review HEP and progress PRN, knee extension ROM, quad activation progressing to SLR, patellar mobs, hip strengthening, gait training, vaso   Hilda Blades, PT, DPT, LAT, ATC 07/15/22  10:00 AM Phone: [phone] Fax: [phone]

## 2022-07-15 ENCOUNTER — Encounter: Payer: Self-pay | Admitting: Physical Therapy

## 2022-07-15 ENCOUNTER — Other Ambulatory Visit: Payer: Self-pay

## 2022-07-15 ENCOUNTER — Ambulatory Visit: Payer: 59 | Admitting: Physical Therapy

## 2022-07-15 DIAGNOSIS — M6281 Muscle weakness (generalized): Secondary | ICD-10-CM

## 2022-07-15 DIAGNOSIS — G8929 Other chronic pain: Secondary | ICD-10-CM | POA: Diagnosis not present

## 2022-07-15 DIAGNOSIS — M25561 Pain in right knee: Secondary | ICD-10-CM | POA: Diagnosis not present

## 2022-07-15 DIAGNOSIS — R2689 Other abnormalities of gait and mobility: Secondary | ICD-10-CM | POA: Diagnosis not present

## 2022-07-17 NOTE — Therapy (Signed)
OUTPATIENT PHYSICAL THERAPY TREATMENT NOTE   Patient Name: Cheryl Cole MRN: [id number] DOB:May 17, 2001, 21 y.o., female Today's Date: 07/20/2022  PCP: Lacie Draft, MD   REFERRING PROVIDER: Hiram Gash, MD   END OF SESSION:   PT End of Session - 07/20/22 0920     Visit Number 8    Number of Visits 17    Date for PT Re-Evaluation 08/22/22    Authorization Type [employer]    Authorization Time Period 07/01/2022 - 08/01/2022    Authorization - Visit Number 6    Authorization - Number of Visits 8    PT Start Time 0915    PT Stop Time 1000    PT Time Calculation (min) 45 min    Equipment Utilized During Treatment Right knee immobilizer    Activity Tolerance Patient tolerated treatment well    Behavior During Therapy [facility] for tasks assessed/performed                   Past Medical History:  Diagnosis Date   Torn meniscus    Past Surgical History:  Procedure Laterality Date   ANTERIOR CRUCIATE LIGAMENT REPAIR Right 06/25/2022   Procedure: RECONSTRUCTION ANTERIOR CRUCIATE LIGAMENT (ACL);  Surgeon: Hiram Gash, MD;  Location: [place];  Service: Orthopedics;  Laterality: Right;   KNEE ARTHROSCOPY WITH ANTERIOR CRUCIATE LIGAMENT (ACL) REPAIR Left 02/05/2021   Procedure: KNEE ARTHROSCOPY WITH ANTERIOR CRUCIATE LIGAMENT (ACL) REPAIR WITH AUTOGRAFT;  Surgeon: Hiram Gash, MD;  Location: [place];  Service: Orthopedics;  Laterality: Left;   KNEE ARTHROSCOPY WITH LATERAL MENISECTOMY  02/05/2021   Procedure: KNEE ARTHROSCOPY WITH LATERAL MENISECTOMY;  Surgeon: Hiram Gash, MD;  Location: [place];  Service: Orthopedics;;   KNEE ARTHROSCOPY WITH MEDIAL MENISECTOMY Left 02/05/2021   Procedure: KNEE ARTHROSCOPY WITH  MEDIAL MENISCUS REPAIR;  Surgeon: Hiram Gash, MD;  Location: [place];  Service: Orthopedics;  Laterality: Left;   KNEE ARTHROSCOPY WITH MEDIAL MENISECTOMY Right 06/25/2022   Procedure:  KNEE ARTHROSCOPY WITH MEDIAL MENISECTOMY;  Surgeon: Hiram Gash, MD;  Location: [place];  Service: Orthopedics;  Laterality: Right;   There are no problems to display for this patient.   REFERRING DIAG: Right knee arthroscopy with ACL reconstruction with BTB autograft and medical meniscus repair 8/24  THERAPY DIAG:  Chronic pain of right knee  Muscle weakness (generalized)  Other abnormalities of gait and mobility  Rationale for Evaluation and Treatment Rehabilitation  PERTINENT HISTORY: s/p right ACL reconstruction with BTB autograft, medial meniscectomy, lateral meniscus repair 06/25/2022  PRECAUTIONS: Knee   SUBJECTIVE: Patient reports she is doing well, no new issues. The knee is feeling good.   PAIN:  Are you having pain? 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   OBJECTIVE: (objective measures completed at initial evaluation unless otherwise dated) PATIENT SURVEYS:  LEFS: 17/80  EDEMA:  Circumferential: Right: 39 cm, Left: 37 cm    MUSCLE LENGTH: Hamstring, calf, and quad flexibility deficit  LOWER EXTREMITY ROM:   Active ROM Right eval Left eval Right 07/03/2022 Right 07/10/2022 Right 07/15/2022 Right 07/20/2022  Knee flexion 60 135 90 deg 90 deg 90 deg 90 deg  Knee extension 5 lacking 8 hyper  3 hyper 3 hyper      PROM  right knee 5-0-90 - 07/07/2022  LOWER EXTREMITY MMT:   Patient with good quad activation, slight extension lag with SLR  07/01/2022: patient able to perform SLR without lag   MMT Right eval Left eval  Hip flexion 4- 4  Hip extension 4- 4  Hip abduction 4- 4  Knee flexion - 5  Knee extension - 5    FUNCTIONAL TESTS:  Not assessed   GAIT: - 07/13/2022 Assistive device utilized: None Level of  assistance: Modified independence Comments: Right knee immobilizer unlocked to 50 deg, grossly WFL     TODAY'S TREATMENT: [facility] Adult PT Treatment:                                                DATE: 07/20/2022 Therapeutic Exercise: Quad set with heel prop x 10 with 5 sec hold Hooklying ATKHE using strap for assist x 10 with 5 sec hold Quad set with focus on popping heel up x 10 with 5 sec hold Active heel slide x 10 with 5 sec hold Wall squat to approx 45 deg knee flexion 3 x 10 Standing heel raises with knee brace on 2 x 20 Standing TKE with blue band 2 x 10 Forward 4" step-up 2 x 10 Sidelying hip abduction with blue at knees 3 x 15 each Lateral 4" heel tap 2 x 10 Retro walking with FM cable resistance 17# 2 x 3 lengths Blood flow restriction Position and location of cuff: Supine and proximal thigh Limb occlusion pressure (mmHg): 200 mmHg Exercise pressure (mmHg): 160 mmHg (80%) Exercise prescription: 09,62,83,66, reps with 30-60 sec rest Exercise comment: SLR Neuro Reed: SLS on Airex 3 x 30 sec each   [facility] Adult PT Treatment:                                                DATE: 07/15/2022 Therapeutic Exercise: Quad set with heel prop x 10 with 5 sec hold Hooklying ATKHE using strap for assist x 10 with 5 sec hold Quad set with focus on popping heel up x 10 with 5 sec hold Quad set with small towel roll under knee x 10 with 5 sec hold Active heel slide x 10 with 5 sec hold Blood flow restriction Position and location of cuff: Supine and proximal thigh Limb occlusion pressure (mmHg): 200 mmHg Exercise pressure (mmHg): 160 mmHg (80%) Exercise prescription: 29,47,65,46, reps with 30-60 sec rest Exercise comment: SLR Sidelying hip abduction with green at knees 3 x 15 each Standing TKE with green band 2 x 20 Standing heel raises with knee brace on 2 x 20 Wall squat to approx 45 deg knee flexion with knee brace on 2 x 10 Lateral 2" heel tap 2 x 10 Seated quad set for demo to  perform at home Neuro Reed: SLS 3 x 30 sec each  [facility] Adult PT Treatment:                                                DATE: 07/13/2022 Therapeutic Exercise: Quad set with heel prop x 10 with 5 sec hold Hooklying  ATKHE using strap for assist x 10 with 5 sec hold Quad set with focus on popping heel up x 10 with 5 sec hold Active heel slide x 10 with 5 sec hold Blood flow restriction Position and location of cuff: Supine and proximal thigh Limb occlusion pressure (mmHg): 200 mmHg Exercise pressure (mmHg): 140 mmHg (70%) Exercise prescription: 38,75,64,33, reps with 30-60 sec rest Exercise comment: SLR Blood flow restriction Position and location of cuff: Prone and proximal thigh Limb occlusion pressure (mmHg): 200 mmHg Exercise pressure (mmHg): 140 mmHg (70%) Exercise prescription: 29,51,88,41, reps with 30-60 sec rest Exercise comment: prone hamstring curl Standing calf stretch at wall 2 x 30 sec Standing heel raises with knee brace on 2 x 20 Standing TKE with green band and knee brace on  2 x 15 Wall squat to approx 45 deg knee flexion with knee brace on 3 x 10 Neuro Reed: SLS 3 x 30 sec each  PATIENT EDUCATION:  Education details: HEP Person educated: Patient Education method: Consulting civil engineer, Media planner, Corporate treasurer cues, Verbal cues Education comprehension: verbalized understanding, returned demonstration, verbal cues required, tactile cues required, and needs further education   HOME EXERCISE PROGRAM: Access Code: [email or web address]     ASSESSMENT: CLINICAL IMPRESSION: Patient tolerated therapy well with no adverse effects. Therapy continues to focus primarily on progressing her strength and control with good tolerance. She is progressing nicely with closed chain quad strengthening and denies any pain with exercises. She does require cueing for ensuring even weight distribution and knee control with exercises. No changes to HEP this visit. Patient would benefit from continued skilled  PT to progress her mobility and strength in order to reduce pain and maximize functional ability.     OBJECTIVE IMPAIRMENTS Abnormal gait, decreased activity tolerance, decreased balance, difficulty walking, decreased ROM, decreased strength, impaired flexibility, improper body mechanics, and pain.    ACTIVITY LIMITATIONS lifting, bending, standing, squatting, stairs, transfers, and locomotion level   PARTICIPATION LIMITATIONS: meal prep, cleaning, shopping, community activity, and occupation   PERSONAL FACTORS Past/current experiences are also affecting patient's functional outcome.      GOALS: Goals reviewed with patient? Yes   SHORT TERM GOALS: Target date: 07/25/2022    Patient will be I with initial HEP in order to progress with therapy. Baseline: HEP provided at eval Goal status: INITIAL   2.  Patient will perform SLR x 10 repetitions without extension lag in order to improve quad control with ambulation Baseline: good quad activation, slight extension lag with SLR, fatigues with repetitions 07/03/2022: patient able to perform SLR x 10 without lag Goal status: MET   3.  Patient will demonstrate normalize gait mechanics without AD in order to improve walking ability Baseline: patient using bilateral crutches and knee immobilizer locked in extension Goal status: INITIAL   4.  Patient will demonstrate right knee AROM 5-0-90 deg in order to  Baseline: right knee AROM 5-60 deg Goal status: INITIAL   5. Patient will report pain level </= 2/10 with walking or PT related activities in order to allow progress with therapy and reduce functional limitations.            Baseline: 4/10 Goal Status: INITIAL   LONG TERM GOALS: Target date: 08/22/2022    Patient will be I with final HEP to maintain progress from PT. Baseline: HEP provided at eval Goal status: INITIAL   2.  Patient will report >/= 56/80 on LEFS in order to indicate improved functional ability without progression to  running or jumping  tasks Baseline: 17/80 Goal status: INITIAL   3.  Patient will be able to demonstrate a squat without deviation or pain in order to progress strength and improve ability to perform household tasks Baseline: unable at eval Goal status: INITIAL   4.  Patient will demonstrate full right knee AROM in order to improve functional mobility and reduce limitations with movement Baseline: knee AROM 5-60 deg at evaluation Goal status: INITIAL     PLAN: PT FREQUENCY: 1-2x/week   PT DURATION: 8 weeks   PLANNED INTERVENTIONS: Therapeutic exercises, Therapeutic activity, Neuromuscular re-education, Balance training, Gait training, Patient/Family education, Self Care, Joint mobilization, Joint manipulation, Aquatic Therapy, Dry Needling, Electrical stimulation, Cryotherapy, Moist heat, Taping, Vasopneumatic device, Manual therapy, and Re-evaluation   PLAN FOR NEXT SESSION: Review HEP and progress PRN, knee extension ROM, quad activation progressing to SLR, patellar mobs, hip strengthening, gait training, vaso   Hilda Blades, PT, DPT, LAT, ATC 07/20/22  10:47 AM Phone: [phone] Fax: [phone]

## 2022-07-20 ENCOUNTER — Encounter: Payer: Self-pay | Admitting: Physical Therapy

## 2022-07-20 ENCOUNTER — Other Ambulatory Visit: Payer: Self-pay

## 2022-07-20 ENCOUNTER — Ambulatory Visit: Payer: 59 | Admitting: Physical Therapy

## 2022-07-20 DIAGNOSIS — G8929 Other chronic pain: Secondary | ICD-10-CM | POA: Diagnosis not present

## 2022-07-20 DIAGNOSIS — M25561 Pain in right knee: Secondary | ICD-10-CM | POA: Diagnosis not present

## 2022-07-20 DIAGNOSIS — R2689 Other abnormalities of gait and mobility: Secondary | ICD-10-CM

## 2022-07-20 DIAGNOSIS — M6281 Muscle weakness (generalized): Secondary | ICD-10-CM

## 2022-07-22 ENCOUNTER — Encounter: Payer: Self-pay | Admitting: Physical Therapy

## 2022-07-27 ENCOUNTER — Other Ambulatory Visit: Payer: Self-pay

## 2022-07-27 ENCOUNTER — Ambulatory Visit: Payer: 59 | Admitting: Physical Therapy

## 2022-07-27 ENCOUNTER — Encounter: Payer: Self-pay | Admitting: Physical Therapy

## 2022-07-27 DIAGNOSIS — G8929 Other chronic pain: Secondary | ICD-10-CM

## 2022-07-27 DIAGNOSIS — R2689 Other abnormalities of gait and mobility: Secondary | ICD-10-CM

## 2022-07-27 DIAGNOSIS — M6281 Muscle weakness (generalized): Secondary | ICD-10-CM

## 2022-07-27 DIAGNOSIS — M25561 Pain in right knee: Secondary | ICD-10-CM | POA: Diagnosis not present

## 2022-07-27 NOTE — Patient Instructions (Signed)
Access Code: [email or web address] URL: [email or web address] Date: 07/27/2022 Prepared by: Hilda Blades  Exercises - Long Sitting Quad Set  - 3 x daily - 20 reps - 5 seconds hold - Long Sitting Quad Set with Towel Roll Under Heel  - 3 x daily - 20 reps - 5 seconds hold - Supine Heel Slide with Strap  - 2 x daily - 10 reps - 5 seconds hold - Long Sitting Hamstring Stretch  - 2 x daily - 3 reps - 30 seconds hold - Active Straight Leg Raise with Quad Set  - 1 x daily - 3 sets - 10 reps - Single Leg Bridge  - 1 x daily - 3 sets - 10 reps - Sidelying Hip Abduction  - 1 x daily - 3 sets - 15 reps - Wall Squat  - 1 x daily - 3 sets - 8-10 reps - 5 seconds hold - Standing Single Leg Heel Raise  - 1 x daily - 3 sets - 20 reps - Standing Terminal Knee Extension with Resistance  - 1 x daily - 2 sets - 15 reps - Side Stepping with Resistance at Thighs  - 1 x daily - 3 sets - 20 reps - Gastroc Stretch on Wall  - 2 x daily - 3 reps - 30 secnds hold - Single Leg Balance with Clock Reach  - 1 x daily - 3 sets - 60 seconds hold

## 2022-07-27 NOTE — Therapy (Signed)
OUTPATIENT PHYSICAL THERAPY TREATMENT NOTE   Patient Name: Cheryl Cole MRN: [id number] DOB:September 06, 2001, 21 y.o., female Today's Date: 07/27/2022  PCP: Lacie Draft, MD   REFERRING PROVIDER: Hiram Gash, MD   END OF SESSION:   PT End of Session - 07/27/22 0917     Visit Number 9    Number of Visits 17    Date for PT Re-Evaluation 08/22/22    Authorization Type [employer]    Authorization Time Period 07/01/2022 - 08/01/2022    Authorization - Visit Number 8    Authorization - Number of Visits 8    PT Start Time 0914    PT Stop Time 1000    PT Time Calculation (min) 46 min    Activity Tolerance Patient tolerated treatment well    Behavior During Therapy [facility] for tasks assessed/performed                    Past Medical History:  Diagnosis Date   Torn meniscus    Past Surgical History:  Procedure Laterality Date   ANTERIOR CRUCIATE LIGAMENT REPAIR Right 06/25/2022   Procedure: RECONSTRUCTION ANTERIOR CRUCIATE LIGAMENT (ACL);  Surgeon: Hiram Gash, MD;  Location: [place];  Service: Orthopedics;  Laterality: Right;   KNEE ARTHROSCOPY WITH ANTERIOR CRUCIATE LIGAMENT (ACL) REPAIR Left 02/05/2021   Procedure: KNEE ARTHROSCOPY WITH ANTERIOR CRUCIATE LIGAMENT (ACL) REPAIR WITH AUTOGRAFT;  Surgeon: Hiram Gash, MD;  Location: [place];  Service: Orthopedics;  Laterality: Left;   KNEE ARTHROSCOPY WITH LATERAL MENISECTOMY  02/05/2021   Procedure: KNEE ARTHROSCOPY WITH LATERAL MENISECTOMY;  Surgeon: Hiram Gash, MD;  Location: [place];  Service: Orthopedics;;   KNEE ARTHROSCOPY WITH MEDIAL MENISECTOMY Left 02/05/2021   Procedure: KNEE ARTHROSCOPY WITH  MEDIAL MENISCUS REPAIR;  Surgeon: Hiram Gash, MD;  Location: [place];  Service: Orthopedics;  Laterality: Left;   KNEE ARTHROSCOPY WITH MEDIAL MENISECTOMY Right 06/25/2022   Procedure: KNEE ARTHROSCOPY WITH MEDIAL MENISECTOMY;  Surgeon: Hiram Gash, MD;  Location: [place];  Service: Orthopedics;  Laterality: Right;   There are no problems to display for this patient.   REFERRING DIAG: Right knee arthroscopy with ACL reconstruction with BTB autograft and medical meniscus repair 8/24  THERAPY DIAG:  Chronic pain of right knee  Muscle weakness (generalized)  Other abnormalities of gait and mobility  Rationale for Evaluation and Treatment Rehabilitation  PERTINENT HISTORY: s/p right ACL reconstruction with BTB autograft, medial meniscectomy, lateral meniscus repair 06/25/2022  PRECAUTIONS: Knee   SUBJECTIVE: Patient reports she is doing well. She was on her feet more and she felt some cramping in her quad.  PAIN:  Are you having pain? 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   OBJECTIVE: (objective measures completed at initial evaluation unless otherwise dated) PATIENT SURVEYS:  LEFS: 17/80  07/27/2022: 43/80  EDEMA:  Circumferential: Right: 39 cm, Left: 37 cm    MUSCLE LENGTH: Hamstring, calf, and quad flexibility deficit  LOWER EXTREMITY ROM:   Active ROM Right eval Left eval Right 07/20/2022 Right 07/27/2022  Knee flexion 60 135 90 deg 117  Knee extension 5 lacking 8 hyper  5 hyper     PROM right knee 5-0-90 - 07/07/2022  LOWER EXTREMITY MMT:   Patient with  good quad activation, slight extension lag with SLR  07/01/2022: patient able to perform SLR without lag   MMT Right eval Left eval  Hip flexion 4- 4  Hip extension 4- 4  Hip abduction 4- 4  Knee flexion - 5  Knee extension - 5    FUNCTIONAL TESTS:  Not assessed   GAIT: - 07/27/2022 Assistive device utilized: None Level of assistance: Independent Comments: grossly WFL     TODAY'S TREATMENT: [facility] Adult PT  Treatment:                                                DATE: 07/27/2022 Therapeutic Exercise: Quad set with heel prop x 10 with 5 sec hold Hooklying ATKHE using strap for assist x 10 with 5 sec hold Quad set with focus on popping heel up x 10 with 5 sec hold Standing TKE with blue 2 x 15 Standing SL heel raises 2 x 20 Leg press (cybex) single leg: right 20# x 10, 30# 2 x 10; left 110# 3 x 8 Forward 6" step-up 2 x 10 Lateral band walk 3 x 20 down/back Lateral 4" heel tap 2 x 10 SL bridge 2 x 10 Kickstand RDL with 15# 2 x 10 Calf stretch at wall 2 x 30 sec Wall squat x 10 with 5 sec hold Neuro Reed: SLS with clock reach 3 x 60 sec each   [facility] Adult PT Treatment:                                                DATE: 07/20/2022 Therapeutic Exercise: Quad set with heel prop x 10 with 5 sec hold Hooklying ATKHE using strap for assist x 10 with 5 sec hold Quad set with focus on popping heel up x 10 with 5 sec hold Active heel slide x 10 with 5 sec hold Wall squat to approx 45 deg knee flexion 3 x 10 Standing heel raises with knee brace on 2 x 20 Standing TKE with blue band 2 x 10 Forward 4" step-up 2 x 10 Sidelying hip abduction with blue at knees 3 x 15 each Lateral 4" heel tap 2 x 10 Retro walking with FM cable resistance 17# 2 x 3 lengths Blood flow restriction Position and location of cuff: Supine and proximal thigh Limb occlusion pressure (mmHg): 200 mmHg Exercise pressure (mmHg): 160 mmHg (80%) Exercise prescription: 67,61,95,09, reps with 30-60 sec rest Exercise comment: SLR Neuro Reed: SLS on Airex 3 x 30 sec each  [facility] Adult PT Treatment:                                                DATE: 07/15/2022 Therapeutic Exercise: Quad set with heel prop x 10 with 5 sec hold Hooklying ATKHE using strap for assist x 10 with 5 sec hold Quad set with focus on popping heel up x 10 with 5 sec hold Quad set with small towel roll under knee x 10 with 5 sec hold Active heel slide x 10  with 5 sec hold Blood flow restriction Position and location of cuff:  Supine and proximal thigh Limb occlusion pressure (mmHg): 200 mmHg Exercise pressure (mmHg): 160 mmHg (80%) Exercise prescription: 91,50,56,97, reps with 30-60 sec rest Exercise comment: SLR Sidelying hip abduction with green at knees 3 x 15 each Standing TKE with green band 2 x 20 Standing heel raises with knee brace on 2 x 20 Wall squat to approx 45 deg knee flexion with knee brace on 2 x 10 Lateral 2" heel tap 2 x 10 Seated quad set for demo to perform at home Neuro Reed: SLS 3 x 30 sec each  PATIENT EDUCATION:  Education details: HEP update, wean from brace with all walking, gradual progression of knee flexion past 90 deg Person educated: Patient Education method: Explanation, Demonstration, Tactile cues, Verbal cues, Handout Education comprehension: verbalized understanding, returned demonstration, verbal cues required, tactile cues required, and needs further education   HOME EXERCISE PROGRAM: Access Code: [email or web address]     ASSESSMENT: CLINICAL IMPRESSION: Patient tolerated therapy well with no adverse effects. She demonstrates much improved knee flexion this visit and per protocol has progressed past 90 deg flexion restriction and has weaned from knee brace as she does demonstrate good quad control. Therapy continues to focus primarily on progressing her strength and control with good tolerance. She does require consistent cueing for proper knee control with exercises and exhibits continued gross strength deficit of the right knee. Updated her HEP to progress strength and standing exercises. Patient would benefit from continued skilled PT to progress her mobility and strength in order to reduce pain and maximize functional ability.     OBJECTIVE IMPAIRMENTS Abnormal gait, decreased activity tolerance, decreased balance, difficulty walking, decreased ROM, decreased strength, impaired flexibility, improper body  mechanics, and pain.    ACTIVITY LIMITATIONS lifting, bending, standing, squatting, stairs, transfers, and locomotion level   PARTICIPATION LIMITATIONS: meal prep, cleaning, shopping, community activity, and occupation   PERSONAL FACTORS Past/current experiences are also affecting patient's functional outcome.      GOALS: Goals reviewed with patient? Yes   SHORT TERM GOALS: Target date: 07/25/2022    Patient will be I with initial HEP in order to progress with therapy. Baseline: HEP provided at eval 07/27/2022: independent with initial current HEP Goal status: MET   2.  Patient will perform SLR x 10 repetitions without extension lag in order to improve quad control with ambulation Baseline: good quad activation, slight extension lag with SLR, fatigues with repetitions 07/03/2022: patient able to perform SLR x 10 without lag Goal status: MET   3.  Patient will demonstrate normalize gait mechanics without AD in order to improve walking ability Baseline: patient using bilateral crutches and knee immobilizer locked in extension 07/27/2022: patient ambulating without device Goal status: MET   4.  Patient will demonstrate right knee AROM 5-0-90 deg in order to  Baseline: right knee AROM 5-60 deg 07/27/2022: right knee AROM 5 - 0 - 117 deg Goal status: MET   5. Patient will report pain level </= 2/10 with walking or PT related activities in order to allow progress with therapy and reduce functional limitations.            Baseline: 4/10  07/27/2022: patient denies pain with exercises Goal Status: MET   LONG TERM GOALS: Target date: 08/22/2022    Patient will be I with final HEP to maintain progress from PT. Baseline: HEP provided at eval 07/27/2022: progressing toward final HEP Goal status: ONGOING   2.  Patient will report >/= 56/80 on LEFS in order to indicate improved  functional ability without progression to running or jumping tasks Baseline: 17/80 07/27/2022: 43/80 Goal status:  PARTIALLY MET   3.  Patient will be able to demonstrate a squat without deviation or pain in order to progress strength and improve ability to perform household tasks Baseline: unable at eval 07/27/2022: patient performing wall squats, requires cues for even weight distribution and proper knee alignment Goal status: PARTIALLY MET   4.  Patient will demonstrate full right knee AROM in order to improve functional mobility and reduce limitations with movement Baseline: knee AROM 5-60 deg at evaluation 07/27/2022: right knee AROM 5 - 0 - 117 deg Goal status: PARTIALLY MET     PLAN: PT FREQUENCY: 1-2x/week   PT DURATION: 8 weeks   PLANNED INTERVENTIONS: Therapeutic exercises, Therapeutic activity, Neuromuscular re-education, Balance training, Gait training, Patient/Family education, Self Care, Joint mobilization, Joint manipulation, Aquatic Therapy, Dry Needling, Electrical stimulation, Cryotherapy, Moist heat, Taping, Vasopneumatic device, Manual therapy, and Re-evaluation   PLAN FOR NEXT SESSION: Review HEP and progress PRN, progressing quad and hip strengthening, single leg stability   Hilda Blades, PT, DPT, LAT, ATC 07/27/22  10:26 AM Phone: [phone] Fax: [phone]

## 2022-07-30 NOTE — Therapy (Signed)
OUTPATIENT PHYSICAL THERAPY TREATMENT NOTE   Patient Name: Cheryl Cole MRN: [id number] DOB:05-29-2001, 21 y.o., female Today's Date: 07/31/2022  PCP: Lacie Draft, MD   REFERRING PROVIDER: Hiram Gash, MD   END OF SESSION:   PT End of Session - 07/31/22 1135     Visit Number 10    Number of Visits 17    Date for PT Re-Evaluation 08/22/22    Authorization Type [employer]    Authorization Time Period 07/31/2022 - 09/01/2022    Authorization - Visit Number 1    Authorization - Number of Visits 10    PT Start Time 1130    PT Stop Time 1215    PT Time Calculation (min) 45 min    Activity Tolerance Patient tolerated treatment well    Behavior During Therapy [facility] for tasks assessed/performed                     Past Medical History:  Diagnosis Date   Torn meniscus    Past Surgical History:  Procedure Laterality Date   ANTERIOR CRUCIATE LIGAMENT REPAIR Right 06/25/2022   Procedure: RECONSTRUCTION ANTERIOR CRUCIATE LIGAMENT (ACL);  Surgeon: Hiram Gash, MD;  Location: [place];  Service: Orthopedics;  Laterality: Right;   KNEE ARTHROSCOPY WITH ANTERIOR CRUCIATE LIGAMENT (ACL) REPAIR Left 02/05/2021   Procedure: KNEE ARTHROSCOPY WITH ANTERIOR CRUCIATE LIGAMENT (ACL) REPAIR WITH AUTOGRAFT;  Surgeon: Hiram Gash, MD;  Location: [place];  Service: Orthopedics;  Laterality: Left;   KNEE ARTHROSCOPY WITH LATERAL MENISECTOMY  02/05/2021   Procedure: KNEE ARTHROSCOPY WITH LATERAL MENISECTOMY;  Surgeon: Hiram Gash, MD;  Location: [place];  Service: Orthopedics;;   KNEE ARTHROSCOPY WITH MEDIAL MENISECTOMY Left 02/05/2021   Procedure: KNEE ARTHROSCOPY WITH  MEDIAL MENISCUS REPAIR;  Surgeon: Hiram Gash, MD;  Location: [place];  Service: Orthopedics;  Laterality: Left;   KNEE ARTHROSCOPY WITH MEDIAL MENISECTOMY Right 06/25/2022   Procedure: KNEE ARTHROSCOPY WITH MEDIAL MENISECTOMY;  Surgeon: Hiram Gash, MD;  Location: [place];  Service: Orthopedics;  Laterality: Right;   There are no problems to display for this patient.   REFERRING DIAG: Right knee arthroscopy with ACL reconstruction with BTB autograft and medical meniscus repair 8/24  THERAPY DIAG:  Chronic pain of right knee  Muscle weakness (generalized)  Other abnormalities of gait and mobility  Rationale for Evaluation and Treatment Rehabilitation  PERTINENT HISTORY: s/p right ACL reconstruction with BTB autograft, medial meniscectomy, lateral meniscus repair 06/25/2022  PRECAUTIONS: Knee   SUBJECTIVE: Patient reports she is doing well. She was on her feet more and she felt some cramping in her quad.  PAIN:  Are you having pain? 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   OBJECTIVE: (objective measures completed at initial evaluation unless otherwise dated) PATIENT SURVEYS:  LEFS: 17/80  07/27/2022: 43/80  EDEMA:  Circumferential: Right: 39 cm, Left: 37 cm    MUSCLE LENGTH: Hamstring, calf, and quad flexibility deficit  LOWER EXTREMITY ROM:   Active ROM Right eval Left eval Right 07/20/2022 Right 07/27/2022  Knee flexion 60 135 90 deg 117  Knee extension 5 lacking 8 hyper  5 hyper     PROM right knee 5-0-90 - 07/07/2022  LOWER EXTREMITY MMT:   Patient  with good quad activation, slight extension lag with SLR  07/01/2022: patient able to perform SLR without lag   MMT Right eval Left eval  Hip flexion 4- 4  Hip extension 4- 4  Hip abduction 4- 4  Knee flexion - 5  Knee extension - 5    FUNCTIONAL TESTS:  Not assessed   GAIT: - 07/27/2022 Assistive device utilized: None Level of assistance: Independent Comments: grossly WFL     TODAY'S TREATMENT: [facility] Adult  PT Treatment:                                                DATE: 07/31/2022 Therapeutic Exercise: Recumbent bike L3 x 5 min while taking subjective Leg press (cybex) DL: 80# x 10, 100# 2 x 10 SL: right 30# 2 x 10, 40# x 10 (left 120# 3 x 6) Standing TKE with black power band 2 x 15 Deadlift 45# DB 3 x 8 Standing TKE with black power band 2 x 10 right Lateral 4" heel tap 2 x 10 right Standing SL heel raises 2 x 20 each Split squat in doorframe 2 x 10 - focus on anterior translation to toe Lateral band walk with black at knees 3 x 20 down/back Bridge with ball hamstring curl 2 x 10 Neuro Reed: SLS on Airex with rebounder ball toss 3 x 20   [facility] Adult PT Treatment:                                                DATE: 07/27/2022 Therapeutic Exercise: Quad set with heel prop x 10 with 5 sec hold Hooklying ATKHE using strap for assist x 10 with 5 sec hold Quad set with focus on popping heel up x 10 with 5 sec hold Standing TKE with blue 2 x 15 Standing SL heel raises 2 x 20 Leg press (cybex) single leg: right 20# x 10, 30# 2 x 10; left 110# 3 x 8 Forward 6" step-up 2 x 10 Lateral band walk 3 x 20 down/back Lateral 4" heel tap 2 x 10 SL bridge 2 x 10 Kickstand RDL with 15# 2 x 10 Calf stretch at wall 2 x 30 sec Wall squat x 10 with 5 sec hold Neuro Reed: SLS with clock reach 3 x 60 sec each  [facility] Adult PT Treatment:                                                DATE: 07/20/2022 Therapeutic Exercise: Quad set with heel prop x 10 with 5 sec hold Hooklying ATKHE using strap for assist x 10 with 5 sec hold Quad set with focus on popping heel up x 10 with 5 sec hold Active heel slide x 10 with 5 sec hold Wall squat to approx 45 deg knee flexion 3 x 10 Standing heel raises with knee brace on 2 x 20 Standing TKE with blue band 2 x 10 Forward 4" step-up 2 x 10 Sidelying hip abduction with blue at knees 3 x 15 each Lateral 4" heel tap 2 x 10 Retro walking with FM  cable resistance 17#  2 x 3 lengths Blood flow restriction Position and location of cuff: Supine and proximal thigh Limb occlusion pressure (mmHg): 200 mmHg Exercise pressure (mmHg): 160 mmHg (80%) Exercise prescription: 15,72,62,03, reps with 30-60 sec rest Exercise comment: SLR Neuro Reed: SLS on Airex 3 x 30 sec each  PATIENT EDUCATION:  Education details: HEP Person educated: Patient Education method: Consulting civil engineer, Media planner, Corporate treasurer cues, Verbal cues Education comprehension: verbalized understanding, returned demonstration, verbal cues required, tactile cues required, and needs further education   HOME EXERCISE PROGRAM: Access Code: [email or web address]     ASSESSMENT: CLINICAL IMPRESSION: Patient tolerated therapy well with no adverse effects. Therapy continues to focus primarily on strengthening and stability with good tolerance. She is progressing well and increasing weight with exercises without any increase in knee pain. No changes to HEP this visit but patient encouraged to go to gym to progress strengthening. Patient would benefit from continued skilled PT to progress her mobility and strength in order to reduce pain and maximize functional ability.     OBJECTIVE IMPAIRMENTS Abnormal gait, decreased activity tolerance, decreased balance, difficulty walking, decreased ROM, decreased strength, impaired flexibility, improper body mechanics, and pain.    ACTIVITY LIMITATIONS lifting, bending, standing, squatting, stairs, transfers, and locomotion level   PARTICIPATION LIMITATIONS: meal prep, cleaning, shopping, community activity, and occupation   PERSONAL FACTORS Past/current experiences are also affecting patient's functional outcome.      GOALS: Goals reviewed with patient? Yes   SHORT TERM GOALS: Target date: 07/25/2022    Patient will be I with initial HEP in order to progress with therapy. Baseline: HEP provided at eval 07/27/2022: independent with initial current HEP Goal status: MET   2.   Patient will perform SLR x 10 repetitions without extension lag in order to improve quad control with ambulation Baseline: good quad activation, slight extension lag with SLR, fatigues with repetitions 07/03/2022: patient able to perform SLR x 10 without lag Goal status: MET   3.  Patient will demonstrate normalize gait mechanics without AD in order to improve walking ability Baseline: patient using bilateral crutches and knee immobilizer locked in extension 07/27/2022: patient ambulating without device Goal status: MET   4.  Patient will demonstrate right knee AROM 5-0-90 deg in order to  Baseline: right knee AROM 5-60 deg 07/27/2022: right knee AROM 5 - 0 - 117 deg Goal status: MET   5. Patient will report pain level </= 2/10 with walking or PT related activities in order to allow progress with therapy and reduce functional limitations.            Baseline: 4/10  07/27/2022: patient denies pain with exercises Goal Status: MET   LONG TERM GOALS: Target date: 08/22/2022    Patient will be I with final HEP to maintain progress from PT. Baseline: HEP provided at eval 07/27/2022: progressing toward final HEP Goal status: ONGOING   2.  Patient will report >/= 56/80 on LEFS in order to indicate improved functional ability without progression to running or jumping tasks Baseline: 17/80 07/27/2022: 43/80 Goal status: PARTIALLY MET   3.  Patient will be able to demonstrate a squat without deviation or pain in order to progress strength and improve ability to perform household tasks Baseline: unable at eval 07/27/2022: patient performing wall squats, requires cues for even weight distribution and proper knee alignment Goal status: PARTIALLY MET   4.  Patient will demonstrate full right knee AROM in order to improve functional mobility and reduce limitations with movement  Baseline: knee AROM 5-60 deg at evaluation 07/27/2022: right knee AROM 5 - 0 - 117 deg Goal status: PARTIALLY MET      PLAN: PT FREQUENCY: 1-2x/week   PT DURATION: 8 weeks   PLANNED INTERVENTIONS: Therapeutic exercises, Therapeutic activity, Neuromuscular re-education, Balance training, Gait training, Patient/Family education, Self Care, Joint mobilization, Joint manipulation, Aquatic Therapy, Dry Needling, Electrical stimulation, Cryotherapy, Moist heat, Taping, Vasopneumatic device, Manual therapy, and Re-evaluation   PLAN FOR NEXT SESSION: Review HEP and progress PRN, progressing quad and hip strengthening, single leg stability   Hilda Blades, PT, DPT, LAT, ATC 07/31/22  12:17 PM Phone: [phone] Fax: [phone]

## 2022-07-31 ENCOUNTER — Encounter: Payer: Self-pay | Admitting: Physical Therapy

## 2022-07-31 ENCOUNTER — Ambulatory Visit: Payer: 59 | Admitting: Physical Therapy

## 2022-07-31 ENCOUNTER — Other Ambulatory Visit: Payer: Self-pay

## 2022-07-31 DIAGNOSIS — M6281 Muscle weakness (generalized): Secondary | ICD-10-CM | POA: Diagnosis not present

## 2022-07-31 DIAGNOSIS — G8929 Other chronic pain: Secondary | ICD-10-CM

## 2022-07-31 DIAGNOSIS — M25561 Pain in right knee: Secondary | ICD-10-CM | POA: Diagnosis not present

## 2022-07-31 DIAGNOSIS — R2689 Other abnormalities of gait and mobility: Secondary | ICD-10-CM

## 2022-08-03 ENCOUNTER — Other Ambulatory Visit: Payer: Self-pay

## 2022-08-03 ENCOUNTER — Encounter: Payer: Self-pay | Admitting: Physical Therapy

## 2022-08-03 ENCOUNTER — Ambulatory Visit: Payer: 59 | Attending: Orthopaedic Surgery | Admitting: Physical Therapy

## 2022-08-03 DIAGNOSIS — M6281 Muscle weakness (generalized): Secondary | ICD-10-CM | POA: Diagnosis not present

## 2022-08-03 DIAGNOSIS — G8929 Other chronic pain: Secondary | ICD-10-CM | POA: Diagnosis not present

## 2022-08-03 DIAGNOSIS — R2689 Other abnormalities of gait and mobility: Secondary | ICD-10-CM | POA: Insufficient documentation

## 2022-08-03 DIAGNOSIS — M25561 Pain in right knee: Secondary | ICD-10-CM | POA: Insufficient documentation
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
## 2022-08-05 NOTE — Therapy (Signed)
OUTPATIENT PHYSICAL THERAPY TREATMENT NOTE   Patient Name: Cheryl Cole MRN: [id number] DOB:07/28/2001, 21 y.o., female Today's Date: 08/06/2022  PCP: Lacie Draft, MD   REFERRING PROVIDER: Hiram Gash, MD   END OF SESSION:   PT End of Session - 08/06/22 0821     Visit Number 12    Number of Visits 17    Date for PT Re-Evaluation 08/22/22    Authorization Type [employer]    Authorization Time Period 07/31/2022 - 09/01/2022    Authorization - Visit Number 3    Authorization - Number of Visits 10    PT Start Time 0830    PT Stop Time 0915    PT Time Calculation (min) 45 min    Activity Tolerance Patient tolerated treatment well    Behavior During Therapy [facility] for tasks assessed/performed                   Past Medical History:  Diagnosis Date   Torn meniscus    Past Surgical History:  Procedure Laterality Date   ANTERIOR CRUCIATE LIGAMENT REPAIR Right 06/25/2022   Procedure: RECONSTRUCTION ANTERIOR CRUCIATE LIGAMENT (ACL);  Surgeon: Hiram Gash, MD;  Location: [place];  Service: Orthopedics;  Laterality: Right;   KNEE ARTHROSCOPY WITH ANTERIOR CRUCIATE LIGAMENT (ACL) REPAIR Left 02/05/2021   Procedure: KNEE ARTHROSCOPY WITH ANTERIOR CRUCIATE LIGAMENT (ACL) REPAIR WITH AUTOGRAFT;  Surgeon: Hiram Gash, MD;  Location: [place];  Service: Orthopedics;  Laterality: Left;   KNEE ARTHROSCOPY WITH LATERAL MENISECTOMY  02/05/2021   Procedure: KNEE ARTHROSCOPY WITH LATERAL MENISECTOMY;  Surgeon: Hiram Gash, MD;  Location: [place];  Service: Orthopedics;;   KNEE ARTHROSCOPY WITH MEDIAL MENISECTOMY Left 02/05/2021   Procedure: KNEE ARTHROSCOPY WITH  MEDIAL MENISCUS REPAIR;  Surgeon: Hiram Gash, MD;  Location: [place];  Service: Orthopedics;  Laterality: Left;   KNEE ARTHROSCOPY WITH MEDIAL MENISECTOMY Right 06/25/2022   Procedure: KNEE ARTHROSCOPY WITH MEDIAL MENISECTOMY;  Surgeon: Hiram Gash, MD;  Location: [place];  Service: Orthopedics;  Laterality: Right;   There are no problems to display for this patient.   REFERRING DIAG: Right knee arthroscopy with ACL reconstruction with BTB autograft and medical meniscus repair 8/24  THERAPY DIAG:  Chronic pain of right knee  Muscle weakness (generalized)  Other abnormalities of gait and mobility  Rationale for Evaluation and Treatment Rehabilitation  PERTINENT HISTORY: s/p right ACL reconstruction with BTB autograft, medial meniscectomy, lateral meniscus repair 06/25/2022  PRECAUTIONS: Knee   SUBJECTIVE: Patient report she is doing well. Denies any pain this visit and is consistent with exercises.  PAIN:  Are you having pain? 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   OBJECTIVE: (objective measures completed at initial evaluation unless otherwise dated) PATIENT SURVEYS:  LEFS: 17/80  07/27/2022: 43/80  EDEMA:  Circumferential: Right: 39 cm, Left: 37 cm    MUSCLE LENGTH: Hamstring, calf, and quad flexibility deficit  LOWER EXTREMITY ROM:   Active ROM Right eval Left eval Right 07/20/2022 Right 07/27/2022 Right 08/03/2022  Knee flexion 60 135 90 deg 117 123  Knee extension 5 lacking 8 hyper  5 hyper 5 hyper     PROM right knee 5-0-90 - 07/07/2022  LOWER EXTREMITY MMT:   Patient with good  quad activation, slight extension lag with SLR  07/01/2022: patient able to perform SLR without lag   MMT Right eval Left eval  Hip flexion 4- 4  Hip extension 4- 4  Hip abduction 4- 4  Knee flexion - 5  Knee extension - 5    FUNCTIONAL TESTS:  Not assessed   GAIT: - 07/27/2022 Assistive device utilized: None Level of assistance: Independent Comments: grossly WFL     TODAY'S  TREATMENT: [facility] Adult PT Treatment:                                                DATE: 08/06/2022 Therapeutic Exercise: Recumbent bike L5 x 5 min while taking subjective Prone quad stretch with strap 2 x 30 sec Standing TKE with red power band x 20 Barbell back squat 45# 2 x 12 - partial range, mirror for visual feedback Standing SL heel raises 2 x 20 each SL kickstand squat to 22" height table 3 x 8 Modified side plank on knees with hip abduction 3 x 10 Split squat 2 x 8 - FR placed at toe to encourage forward knee translation SL RDL with 10# 3 x 10 Bridge with hamstring curl 3 x 8 Neuro Reed: SLS on Airex with triple cone tap forward, lateral, backward 3 x 5 bouts   [facility] Adult PT Treatment:                                                DATE: 08/03/2022 Therapeutic Exercise: Recumbent bike L3 x 5 min while taking subjective Back ward walking with FM 20# x 5 lengths Sled push with 60# x 2 lengths down/back Leg press (cybex) DL: 120# 2 x 10, 140# x 10 SL: right 40# 2 x 8 (left 120# 3 x 8) Goblet squat to table 30# 3 x 10 - mirror for visual feedback SL bridge 3 x 8 Standing TKE with black power band 2 x 15 Lateral band walk with black below knees 3 x 20 down/back Standing SL heel raises 2 x 20 each Neuro Reed: SLS on Airex with ball toss 2 x 30 sec SLS on Airex with forward cone tap (RDL) 2 x 10 each  [facility] Adult PT Treatment:                                                DATE: 07/31/2022 Therapeutic Exercise: Recumbent bike L3 x 5 min while taking subjective Leg press (cybex) DL: 80# x 10, 100# 2 x 10 SL: right 30# 2 x 10, 40# x 10 (left 120# 3 x 6) Standing TKE with black power band 2 x 15 Deadlift 45# DB 3 x 8 Standing TKE with black power band 2 x 10 right Lateral 4" heel tap 2 x 10 right Standing SL heel raises 2 x 20 each Split squat in doorframe 2 x 10 - focus on anterior translation to toe Lateral band walk with black at knees 3 x 20 down/back Bridge with ball  hamstring curl 2 x 10 Neuro Reed: SLS on Airex with rebounder ball toss 3 x 20  PATIENT EDUCATION:  Education details: HEP update Person educated: Patient Education method: Explanation, Demonstration, Tactile cues, Verbal cues, Handout Education comprehension: verbalized understanding, returned demonstration, verbal cues required, tactile cues required, and needs further education   HOME EXERCISE PROGRAM: Access Code: [email or web address]     ASSESSMENT: CLINICAL IMPRESSION: Patient tolerated therapy well with no adverse effects. Therapy focused on progressing strength and SL stability with good tolerance. Progressed to back squats with bar and used mirror for visual feedback to avoid shifting. Updated her HEP to progress strengthening at home and modified frequencies to reduce volume. She was encouraged to continue swelling management as she demonstrates continued joint effusion that can be observed on the lateral aspect of the knee. Patient would benefit from continued skilled PT to progress her mobility and strength in order to reduce pain and maximize functional ability.     OBJECTIVE IMPAIRMENTS Abnormal gait, decreased activity tolerance, decreased balance, difficulty walking, decreased ROM, decreased strength, impaired flexibility, improper body mechanics, and pain.    ACTIVITY LIMITATIONS lifting, bending, standing, squatting, stairs, transfers, and locomotion level   PARTICIPATION LIMITATIONS: meal prep, cleaning, shopping, community activity, and occupation   PERSONAL FACTORS Past/current experiences are also affecting patient's functional outcome.      GOALS: Goals reviewed with patient? Yes   SHORT TERM GOALS: Target date: 07/25/2022    Patient will be I with initial HEP in order to progress with therapy. Baseline: HEP provided at eval 07/27/2022: independent with initial current HEP Goal status: MET   2.  Patient will perform SLR x 10 repetitions without extension lag in order to  improve quad control with ambulation Baseline: good quad activation, slight extension lag with SLR, fatigues with repetitions 07/03/2022: patient able to perform SLR x 10 without lag Goal status: MET   3.  Patient will demonstrate normalize gait mechanics without AD in order to improve walking ability Baseline: patient using bilateral crutches and knee immobilizer locked in extension 07/27/2022: patient ambulating without device Goal status: MET   4.  Patient will demonstrate right knee AROM 5-0-90 deg in order to  Baseline: right knee AROM 5-60 deg 07/27/2022: right knee AROM 5 - 0 - 117 deg Goal status: MET   5. Patient will report pain level </= 2/10 with walking or PT related activities in order to allow progress with therapy and reduce functional limitations.            Baseline: 4/10  07/27/2022: patient denies pain with exercises Goal Status: MET   LONG TERM GOALS: Target date: 08/22/2022    Patient will be I with final HEP to maintain progress from PT. Baseline: HEP provided at eval 07/27/2022: progressing toward final HEP Goal status: ONGOING   2.  Patient will report >/= 56/80 on LEFS in order to indicate improved functional ability without progression to running or jumping tasks Baseline: 17/80 07/27/2022: 43/80 Goal status: PARTIALLY MET   3.  Patient will be able to demonstrate a squat without deviation or pain in order to progress strength and improve ability to perform household tasks Baseline: unable at eval 07/27/2022: patient performing wall squats, requires cues for even weight distribution and proper knee alignment Goal status: PARTIALLY MET   4.  Patient will demonstrate full right knee AROM in order to improve functional mobility and reduce limitations with movement Baseline: knee AROM 5-60 deg at evaluation 07/27/2022: right knee AROM 5 - 0 - 117 deg Goal status: PARTIALLY MET     PLAN: PT FREQUENCY: 1-2x/week  PT DURATION: 8 weeks   PLANNED  INTERVENTIONS: Therapeutic exercises, Therapeutic activity, Neuromuscular re-education, Balance training, Gait training, Patient/Family education, Self Care, Joint mobilization, Joint manipulation, Aquatic Therapy, Dry Needling, Electrical stimulation, Cryotherapy, Moist heat, Taping, Vasopneumatic device, Manual therapy, and Re-evaluation   PLAN FOR NEXT SESSION: Review HEP and progress PRN, progressing quad and hip strengthening, single leg stability   Hilda Blades, PT, DPT, LAT, ATC 08/06/22  9:34 AM Phone: [phone] Fax: [phone]

## 2022-08-06 ENCOUNTER — Encounter: Payer: Self-pay | Admitting: Physical Therapy

## 2022-08-06 ENCOUNTER — Other Ambulatory Visit: Payer: Self-pay

## 2022-08-06 ENCOUNTER — Ambulatory Visit: Payer: 59 | Admitting: Physical Therapy

## 2022-08-06 DIAGNOSIS — R2689 Other abnormalities of gait and mobility: Secondary | ICD-10-CM | POA: Diagnosis not present

## 2022-08-06 DIAGNOSIS — M6281 Muscle weakness (generalized): Secondary | ICD-10-CM | POA: Diagnosis not present

## 2022-08-06 DIAGNOSIS — G8929 Other chronic pain: Secondary | ICD-10-CM

## 2022-08-06 DIAGNOSIS — M25561 Pain in right knee: Secondary | ICD-10-CM | POA: Diagnosis not present

## 2022-08-06 NOTE — Patient Instructions (Signed)
Access Code: [email or web address] URL: [email or web address] Date: 08/06/2022 Prepared by: Hilda Blades  Exercises - Long Sitting Quad Set  - 1 x daily - 20 reps - 5 seconds hold - Long Sitting Quad Set with Towel Roll Under Heel  - 1 x daily - 20 reps - 5 seconds hold - Supine Heel Slide with Strap  - 1 x daily - 10 reps - 5 seconds hold - Long Sitting Hamstring Stretch  - 1 x daily - 3 reps - 30 seconds hold - Prone Quadriceps Stretch with Strap  - 1 x daily - 3 reps - 30 seconds hold - Gastroc Stretch on Wall  - 1 x daily - 3 reps - 30 seconds hold - Active Straight Leg Raise with Quad Set  - 3 sets - 20 reps - Single Leg Bridge  - 3 sets - 10 reps - Sidelying Hip Abduction  - 3 sets - 20 reps - Wall Squat  - 2 sets - 8-10 reps - 5 seconds hold - Standing Single Leg Heel Raise  - 3 sets - 20 reps - Standing Terminal Knee Extension with Resistance  - 2 sets - 20 reps - Side Stepping with Resistance at Thighs  - 3 sets - 20 reps - Single Leg Balance with Clock Reach  - 3 sets - 60 seconds hold - Goblet Squat with Kettlebell  - 3 sets - 8-12 reps - Split Squats Forward Trunk  - 3 sets - 10 reps

## 2022-08-10 ENCOUNTER — Other Ambulatory Visit: Payer: Self-pay

## 2022-08-10 ENCOUNTER — Ambulatory Visit: Payer: 59 | Admitting: Physical Therapy

## 2022-08-10 ENCOUNTER — Encounter: Payer: Self-pay | Admitting: Physical Therapy

## 2022-08-10 DIAGNOSIS — M6281 Muscle weakness (generalized): Secondary | ICD-10-CM

## 2022-08-10 DIAGNOSIS — G8929 Other chronic pain: Secondary | ICD-10-CM

## 2022-08-10 DIAGNOSIS — M25561 Pain in right knee: Secondary | ICD-10-CM | POA: Diagnosis not present

## 2022-08-10 DIAGNOSIS — R2689 Other abnormalities of gait and mobility: Secondary | ICD-10-CM

## 2022-08-10 NOTE — Therapy (Signed)
OUTPATIENT PHYSICAL THERAPY TREATMENT NOTE   Patient Name: Cheryl Cole MRN: [id number] DOB:06/30/2001, 21 y.o., female Today's Date: 08/10/2022  PCP: Lacie Draft, MD   REFERRING PROVIDER: Hiram Gash, MD   END OF SESSION:   PT End of Session - 08/10/22 0920     Visit Number 13    Number of Visits 17    Date for PT Re-Evaluation 08/22/22    Authorization Type [employer]    Authorization Time Period 07/31/2022 - 09/01/2022    Authorization - Visit Number 4    Authorization - Number of Visits 10    PT Start Time 0915    PT Stop Time 1000    PT Time Calculation (min) 45 min    Activity Tolerance Patient tolerated treatment well    Behavior During Therapy [facility] for tasks assessed/performed                    Past Medical History:  Diagnosis Date   Torn meniscus    Past Surgical History:  Procedure Laterality Date   ANTERIOR CRUCIATE LIGAMENT REPAIR Right 06/25/2022   Procedure: RECONSTRUCTION ANTERIOR CRUCIATE LIGAMENT (ACL);  Surgeon: Hiram Gash, MD;  Location: [place];  Service: Orthopedics;  Laterality: Right;   KNEE ARTHROSCOPY WITH ANTERIOR CRUCIATE LIGAMENT (ACL) REPAIR Left 02/05/2021   Procedure: KNEE ARTHROSCOPY WITH ANTERIOR CRUCIATE LIGAMENT (ACL) REPAIR WITH AUTOGRAFT;  Surgeon: Hiram Gash, MD;  Location: [place];  Service: Orthopedics;  Laterality: Left;   KNEE ARTHROSCOPY WITH LATERAL MENISECTOMY  02/05/2021   Procedure: KNEE ARTHROSCOPY WITH LATERAL MENISECTOMY;  Surgeon: Hiram Gash, MD;  Location: [place];  Service: Orthopedics;;   KNEE ARTHROSCOPY WITH MEDIAL MENISECTOMY Left 02/05/2021   Procedure: KNEE ARTHROSCOPY WITH  MEDIAL MENISCUS REPAIR;  Surgeon: Hiram Gash, MD;  Location: [place];  Service: Orthopedics;  Laterality: Left;   KNEE ARTHROSCOPY WITH MEDIAL MENISECTOMY Right 06/25/2022   Procedure: KNEE ARTHROSCOPY WITH MEDIAL MENISECTOMY;  Surgeon: Hiram Gash, MD;  Location: [place];  Service: Orthopedics;  Laterality: Right;   There are no problems to display for this patient.   REFERRING DIAG: Right knee arthroscopy with ACL reconstruction with BTB autograft and medical meniscus repair 8/24  THERAPY DIAG:  Chronic pain of right knee  Muscle weakness (generalized)  Other abnormalities of gait and mobility  Rationale for Evaluation and Treatment Rehabilitation  PERTINENT HISTORY: s/p right ACL reconstruction with BTB autograft, medial meniscectomy, lateral meniscus repair 06/25/2022  PRECAUTIONS: Knee   SUBJECTIVE: Patient reports no issues since last visit.  PAIN:  Are you having pain? 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   OBJECTIVE: (objective measures completed at initial evaluation unless otherwise dated) PATIENT SURVEYS:  LEFS: 17/80  07/27/2022: 43/80  EDEMA:  Circumferential: Right: 39 cm, Left: 37 cm    MUSCLE LENGTH: Hamstring, calf, and quad flexibility deficit  LOWER EXTREMITY ROM:   Active ROM Right eval Left eval Right 07/20/2022 Right 07/27/2022 Right 08/03/2022  Knee flexion 60 135 90 deg 117 123  Knee extension 5 lacking 8 hyper  5 hyper 5 hyper     PROM right knee 5-0-90 - 07/07/2022  LOWER EXTREMITY MMT:   Patient with good quad activation, slight extension lag with SLR  07/01/2022: patient able to perform SLR without lag   MMT Right eval Left eval  Hip flexion 4- 4  Hip extension 4- 4  Hip abduction 4- 4  Knee flexion - 5  Knee extension - 5    FUNCTIONAL TESTS:  Not assessed   GAIT: - 07/27/2022 Assistive device utilized: None Level of assistance: Independent Comments: grossly WFL     TODAY'S TREATMENT: [facility] Adult PT Treatment:                                                 DATE: 08/10/2022 Therapeutic Exercise: Recumbent bike L5 x 5 min while taking subjective Standing TKE with red power band x 20 Barbell back squat 45# 3 x 10 - partial range, mirror for visual feedback Hex bar deadlift 75# 3 x 6 Heel raises on leg press (cybex) 100# 3 x 20 Modified side plank on knees with hip abduction 3 x 12 each Front plank 3 x 20 sec Leg press (cybex) SL 50# 3 x 8 (left 120# 3 x 8) Blood flow restriction Position and location of cuff: Seated and proximal thigh Limb occlusion pressure (mmHg): 180 Exercise pressure (mmHg): 144 (80%) Exercise prescription: 27,61,47,09, reps with 30-60 sec rest Exercise comment: LAQ with 3#   [facility] Adult PT Treatment:                                                DATE: 08/06/2022 Therapeutic Exercise: Recumbent bike L5 x 5 min while taking subjective Prone quad stretch with strap 2 x 30 sec Standing TKE with red power band x 20 Barbell back squat 45# 2 x 12 - partial range, mirror for visual feedback Standing SL heel raises 2 x 20 each SL kickstand squat to 22" height table 3 x 8 Modified side plank on knees with hip abduction 3 x 10 Split squat 2 x 8 - FR placed at toe to encourage forward knee translation SL RDL with 10# 3 x 10 Bridge with hamstring curl 3 x 8 Neuro Reed: SLS on Airex with triple cone tap forward, lateral, backward 3 x 5 bouts  [facility] Adult PT Treatment:                                                DATE: 08/03/2022 Therapeutic Exercise: Recumbent bike L3 x 5 min while taking subjective Back ward walking with FM 20# x 5 lengths Sled push with 60# x 2 lengths down/back Leg press (cybex) DL: 120# 2 x 10, 140# x 10 SL: right 40# 2 x 8 (left 120# 3 x 8) Goblet squat to table 30# 3 x 10 - mirror for visual feedback SL bridge 3 x 8 Standing TKE with black power band 2 x 15 Lateral band walk with black below knees 3 x 20 down/back Standing SL heel raises 2 x 20 each Neuro Reed: SLS  on Airex with ball toss 2 x 30 sec SLS on Airex with forward cone tap (RDL) 2 x 10 each  PATIENT EDUCATION:  Education details: HEP Person educated: Patient Education method: Consulting civil engineer, Demonstration,  Tactile cues, Verbal cues Education comprehension: verbalized understanding, returned demonstration, verbal cues required, tactile cues required, and needs further education   HOME EXERCISE PROGRAM: Access Code: [email or web address]     ASSESSMENT: CLINICAL IMPRESSION: Patient tolerated therapy well with no adverse effects. Therapy continues to focus primarily on progression of strengthening of the right leg with good tolerance. She is progressing with resistance and weight with exercises and improving with squat technique. Utilized BFR to progress quad strengthening. No changes to HEP ths visit. Patient would benefit from continued skilled PT to progress her mobility and strength in order to reduce pain and maximize functional ability.     OBJECTIVE IMPAIRMENTS Abnormal gait, decreased activity tolerance, decreased balance, difficulty walking, decreased ROM, decreased strength, impaired flexibility, improper body mechanics, and pain.    ACTIVITY LIMITATIONS lifting, bending, standing, squatting, stairs, transfers, and locomotion level   PARTICIPATION LIMITATIONS: meal prep, cleaning, shopping, community activity, and occupation   PERSONAL FACTORS Past/current experiences are also affecting patient's functional outcome.      GOALS: Goals reviewed with patient? Yes   SHORT TERM GOALS: Target date: 07/25/2022    Patient will be I with initial HEP in order to progress with therapy. Baseline: HEP provided at eval 07/27/2022: independent with initial current HEP Goal status: MET   2.  Patient will perform SLR x 10 repetitions without extension lag in order to improve quad control with ambulation Baseline: good quad activation, slight extension lag with SLR, fatigues with repetitions 07/03/2022:  patient able to perform SLR x 10 without lag Goal status: MET   3.  Patient will demonstrate normalize gait mechanics without AD in order to improve walking ability Baseline: patient using bilateral crutches and knee immobilizer locked in extension 07/27/2022: patient ambulating without device Goal status: MET   4.  Patient will demonstrate right knee AROM 5-0-90 deg in order to  Baseline: right knee AROM 5-60 deg 07/27/2022: right knee AROM 5 - 0 - 117 deg Goal status: MET   5. Patient will report pain level </= 2/10 with walking or PT related activities in order to allow progress with therapy and reduce functional limitations.            Baseline: 4/10  07/27/2022: patient denies pain with exercises Goal Status: MET   LONG TERM GOALS: Target date: 08/22/2022    Patient will be I with final HEP to maintain progress from PT. Baseline: HEP provided at eval 07/27/2022: progressing toward final HEP Goal status: ONGOING   2.  Patient will report >/= 56/80 on LEFS in order to indicate improved functional ability without progression to running or jumping tasks Baseline: 17/80 07/27/2022: 43/80 Goal status: PARTIALLY MET   3.  Patient will be able to demonstrate a squat without deviation or pain in order to progress strength and improve ability to perform household tasks Baseline: unable at eval 07/27/2022: patient performing wall squats, requires cues for even weight distribution and proper knee alignment Goal status: PARTIALLY MET   4.  Patient will demonstrate full right knee AROM in order to improve functional mobility and reduce limitations with movement Baseline: knee AROM 5-60 deg at evaluation 07/27/2022: right knee AROM 5 - 0 - 117 deg Goal status: PARTIALLY MET     PLAN: PT FREQUENCY: 1-2x/week   PT DURATION: 8 weeks   PLANNED INTERVENTIONS: Therapeutic exercises, Therapeutic activity, Neuromuscular re-education, Balance training, Gait training, Patient/Family education,  Self Care, Joint mobilization, Joint manipulation, Aquatic Therapy, Dry Needling, Electrical stimulation, Cryotherapy, Moist heat, Taping,  Vasopneumatic device, Manual therapy, and Re-evaluation   PLAN FOR NEXT SESSION: Review HEP and progress PRN, progressing quad and hip strengthening, single leg stability   Hilda Blades, PT, DPT, LAT, ATC 08/10/22  10:08 AM Phone: [phone] Fax: [phone]

## 2022-08-11 NOTE — Therapy (Signed)
OUTPATIENT PHYSICAL THERAPY TREATMENT NOTE   Patient Name: Cheryl Cole MRN: [id number] DOB:2001/02/15, 21 y.o., female Today's Date: 08/12/2022  PCP: Lacie Draft, MD   REFERRING PROVIDER: Hiram Gash, MD   END OF SESSION:   PT End of Session - 08/12/22 0835     Visit Number 14    Number of Visits 17    Date for PT Re-Evaluation 08/22/22    Authorization Type [employer]    Authorization Time Period 07/31/2022 - 09/01/2022    Authorization - Visit Number 5    Authorization - Number of Visits 10    PT Start Time 0827    PT Stop Time 0915    PT Time Calculation (min) 48 min    Activity Tolerance Patient tolerated treatment well    Behavior During Therapy [facility] for tasks assessed/performed                     Past Medical History:  Diagnosis Date   Torn meniscus    Past Surgical History:  Procedure Laterality Date   ANTERIOR CRUCIATE LIGAMENT REPAIR Right 06/25/2022   Procedure: RECONSTRUCTION ANTERIOR CRUCIATE LIGAMENT (ACL);  Surgeon: Hiram Gash, MD;  Location: [place];  Service: Orthopedics;  Laterality: Right;   KNEE ARTHROSCOPY WITH ANTERIOR CRUCIATE LIGAMENT (ACL) REPAIR Left 02/05/2021   Procedure: KNEE ARTHROSCOPY WITH ANTERIOR CRUCIATE LIGAMENT (ACL) REPAIR WITH AUTOGRAFT;  Surgeon: Hiram Gash, MD;  Location: [place];  Service: Orthopedics;  Laterality: Left;   KNEE ARTHROSCOPY WITH LATERAL MENISECTOMY  02/05/2021   Procedure: KNEE ARTHROSCOPY WITH LATERAL MENISECTOMY;  Surgeon: Hiram Gash, MD;  Location: [place];  Service: Orthopedics;;   KNEE ARTHROSCOPY WITH MEDIAL MENISECTOMY Left 02/05/2021   Procedure: KNEE ARTHROSCOPY WITH  MEDIAL MENISCUS REPAIR;  Surgeon: Hiram Gash, MD;  Location: [place];  Service: Orthopedics;  Laterality: Left;   KNEE ARTHROSCOPY WITH MEDIAL MENISECTOMY Right 06/25/2022   Procedure: KNEE ARTHROSCOPY WITH MEDIAL MENISECTOMY;  Surgeon:  Hiram Gash, MD;  Location: [place];  Service: Orthopedics;  Laterality: Right;   There are no problems to display for this patient.   REFERRING DIAG: Right knee arthroscopy with ACL reconstruction with BTB autograft and medical meniscus repair 8/24  THERAPY DIAG:  Chronic pain of right knee  Muscle weakness (generalized)  Other abnormalities of gait and mobility  Rationale for Evaluation and Treatment Rehabilitation  PERTINENT HISTORY: s/p right ACL reconstruction with BTB autograft, medial meniscectomy, lateral meniscus repair 06/25/2022  PRECAUTIONS: Knee   SUBJECTIVE: Patient reports no issues since last visit.  PAIN:  Are you having pain? 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   OBJECTIVE: (objective measures completed at initial evaluation unless otherwise dated) PATIENT SURVEYS:  LEFS: 17/80  07/27/2022: 43/80  EDEMA:  Circumferential: Right: 39 cm, Left: 37 cm    MUSCLE LENGTH: Hamstring, calf, and quad flexibility deficit  LOWER EXTREMITY ROM:   Active ROM Right eval Left eval Right 07/20/2022 Right 07/27/2022 Right 08/03/2022  Knee flexion 60 135 90 deg 117 123  Knee extension 5 lacking 8 hyper  5 hyper 5 hyper     PROM right knee 5-0-90 - 07/07/2022  LOWER EXTREMITY MMT:   Patient with good quad activation, slight extension lag with SLR  07/01/2022: patient able to perform SLR without lag   MMT Right eval Left eval Right 08/12/2022  Hip flexion 4- 4 4  Hip extension 4- 4 4  Hip abduction 4- 4 4  Knee flexion - 5 4  Knee extension - 5 4    FUNCTIONAL TESTS:  Not assessed   GAIT: - 07/27/2022 Assistive device utilized: None Level of assistance: Independent Comments: grossly WFL     TODAY'S TREATMENT: [facility]  Adult PT Treatment:                                                DATE: 08/12/2022 Therapeutic Exercise: Elliptical L5 R5 x 5 min while taking subjective Leg press (cybex) DL: 140# x 10, 160# 2 x 10 SL: 50# 3 x 8 (left 140# 4 x 6) Runner step-up on 8" box 3 x 10 Lateral stepping with green power band manual resistance x 3 lengths  Forward heel tap on 4" box 3 x 10 SL heel raises on edge of step 2 x 20 Hamstring curl machine  DL: 45# x 8, 55# 2 x 8 SL: 15# 2 x 10 Blood flow restriction Position and location of cuff: Seated and proximal thigh Limb occlusion pressure (mmHg): 180 Exercise pressure (mmHg): 144 (80%) Exercise prescription: 57,26,20,35, reps with 30-60 sec rest Exercise comment: LAQ with 3#   [facility] Adult PT Treatment:                                                DATE: 08/10/2022 Therapeutic Exercise: Recumbent bike L5 x 5 min while taking subjective Standing TKE with red power band x 20 Barbell back squat 45# 3 x 10 - partial range, mirror for visual feedback Hex bar deadlift 75# 3 x 6 Heel raises on leg press (cybex) 100# 3 x 20 Modified side plank on knees with hip abduction 3 x 12 each Front plank 3 x 20 sec Leg press (cybex) SL 50# 3 x 8 (left 120# 3 x 8) Blood flow restriction Position and location of cuff: Seated and proximal thigh Limb occlusion pressure (mmHg): 180 Exercise pressure (mmHg): 144 (80%) Exercise prescription: 59,74,16,38, reps with 30-60 sec rest Exercise comment: LAQ with 3#  [facility] Adult PT Treatment:                                                DATE: 08/06/2022 Therapeutic Exercise: Recumbent bike L5 x 5 min while taking subjective Prone quad stretch with strap 2 x 30 sec Standing TKE with red power band x 20 Barbell back squat 45# 2 x 12 - partial range, mirror for visual feedback Standing SL heel raises 2 x 20 each SL kickstand squat to 22" height table 3 x 8 Modified side plank on knees with hip abduction 3 x 10 Split squat 2 x 8 -  FR placed at toe to encourage forward knee translation SL RDL with 10# 3 x 10 Bridge with hamstring curl 3 x 8 Neuro Reed: SLS on Airex with triple cone tap forward, lateral, backward 3 x 5 bouts  PATIENT EDUCATION:  Education details: HEP Person educated: Patient Education method: Education officer, environmental, Corporate treasurer cues, Verbal cues Education comprehension: verbalized understanding, returned demonstration, verbal cues required, tactile cues required, and needs further education   HOME EXERCISE PROGRAM: Access Code: [email or web address]     ASSESSMENT: CLINICAL IMPRESSION: Patient tolerated therapy well with no adverse effects. She demonstrates improved strength this visit and is progressing well with strengthening exercises. Therapy continues to focus primarily on strengthening with good tolerance. She does require cueing for proper exercises technique in order to ensure proper knee position to target the quad. No changes to HEP this visit. Patient would benefit from continued skilled PT to progress her mobility and strength in order to reduce pain and maximize functional ability.     OBJECTIVE IMPAIRMENTS Abnormal gait, decreased activity tolerance, decreased balance, difficulty walking, decreased ROM, decreased strength, impaired flexibility, improper body mechanics, and pain.    ACTIVITY LIMITATIONS lifting, bending, standing, squatting, stairs, transfers, and locomotion level   PARTICIPATION LIMITATIONS: meal prep, cleaning, shopping, community activity, and occupation   PERSONAL FACTORS Past/current experiences are also affecting patient's functional outcome.      GOALS: Goals reviewed with patient? Yes   SHORT TERM GOALS: Target date: 07/25/2022    Patient will be I with initial HEP in order to progress with therapy. Baseline: HEP provided at eval 07/27/2022: independent with initial current HEP Goal status: MET   2.  Patient will perform SLR x 10 repetitions without extension lag  in order to improve quad control with ambulation Baseline: good quad activation, slight extension lag with SLR, fatigues with repetitions 07/03/2022: patient able to perform SLR x 10 without lag Goal status: MET   3.  Patient will demonstrate normalize gait mechanics without AD in order to improve walking ability Baseline: patient using bilateral crutches and knee immobilizer locked in extension 07/27/2022: patient ambulating without device Goal status: MET   4.  Patient will demonstrate right knee AROM 5-0-90 deg in order to  Baseline: right knee AROM 5-60 deg 07/27/2022: right knee AROM 5 - 0 - 117 deg Goal status: MET   5. Patient will report pain level </= 2/10 with walking or PT related activities in order to allow progress with therapy and reduce functional limitations.            Baseline: 4/10  07/27/2022: patient denies pain with exercises Goal Status: MET   LONG TERM GOALS: Target date: 08/22/2022    Patient will be I with final HEP to maintain progress from PT. Baseline: HEP provided at eval 07/27/2022: progressing toward final HEP Goal status: ONGOING   2.  Patient will report >/= 56/80 on LEFS in order to indicate improved functional ability without progression to running or jumping tasks Baseline: 17/80 07/27/2022: 43/80 Goal status: PARTIALLY MET   3.  Patient will be able to demonstrate a squat without deviation or pain in order to progress strength and improve ability to perform household tasks Baseline: unable at eval 07/27/2022: patient performing wall squats, requires cues for even weight distribution and proper knee alignment Goal status: PARTIALLY MET   4.  Patient will demonstrate full right knee AROM in order to improve functional mobility and reduce limitations with movement Baseline: knee AROM 5-60 deg at evaluation 07/27/2022: right knee AROM 5 - 0 - 117 deg Goal status: PARTIALLY MET     PLAN: PT FREQUENCY: 1-2x/week   PT DURATION: 8 weeks   PLANNED  INTERVENTIONS: Therapeutic exercises, Therapeutic activity, Neuromuscular re-education, Balance training, Gait training, Patient/Family education,  Self Care, Joint mobilization, Joint manipulation, Aquatic Therapy, Dry Needling, Electrical stimulation, Cryotherapy, Moist heat, Taping, Vasopneumatic device, Manual therapy, and Re-evaluation   PLAN FOR NEXT SESSION: Review HEP and progress PRN, progressing quad and hip strengthening, single leg stability   Hilda Blades, PT, DPT, LAT, ATC 08/12/22  9:37 AM Phone: [phone] Fax: [phone]

## 2022-08-12 ENCOUNTER — Ambulatory Visit: Payer: 59 | Admitting: Physical Therapy

## 2022-08-12 ENCOUNTER — Encounter: Payer: Self-pay | Admitting: Physical Therapy

## 2022-08-12 ENCOUNTER — Other Ambulatory Visit: Payer: Self-pay

## 2022-08-12 DIAGNOSIS — G8929 Other chronic pain: Secondary | ICD-10-CM | POA: Diagnosis not present

## 2022-08-12 DIAGNOSIS — M6281 Muscle weakness (generalized): Secondary | ICD-10-CM | POA: Diagnosis not present

## 2022-08-12 DIAGNOSIS — R2689 Other abnormalities of gait and mobility: Secondary | ICD-10-CM | POA: Diagnosis not present

## 2022-08-12 DIAGNOSIS — M25561 Pain in right knee: Secondary | ICD-10-CM | POA: Diagnosis not present

## 2022-08-17 ENCOUNTER — Encounter: Payer: Self-pay | Admitting: Physical Therapy

## 2022-08-17 ENCOUNTER — Other Ambulatory Visit: Payer: Self-pay

## 2022-08-17 ENCOUNTER — Ambulatory Visit: Payer: 59 | Admitting: Physical Therapy

## 2022-08-17 DIAGNOSIS — R2689 Other abnormalities of gait and mobility: Secondary | ICD-10-CM | POA: Diagnosis not present

## 2022-08-17 DIAGNOSIS — M6281 Muscle weakness (generalized): Secondary | ICD-10-CM

## 2022-08-17 DIAGNOSIS — G8929 Other chronic pain: Secondary | ICD-10-CM

## 2022-08-17 DIAGNOSIS — M25561 Pain in right knee: Secondary | ICD-10-CM | POA: Diagnosis not present

## 2022-08-17 NOTE — Therapy (Signed)
OUTPATIENT PHYSICAL THERAPY TREATMENT NOTE   Patient Name: Cheryl Cole MRN: [id number] DOB:07/06/2001, 21 y.o., female Today's Date: 08/17/2022  PCP: Lacie Draft, MD   REFERRING PROVIDER: Hiram Gash, MD   END OF SESSION:   PT End of Session - 08/17/22 0916     Visit Number 15    Number of Visits 17    Date for PT Re-Evaluation 08/22/22    Authorization Type [employer]    Authorization Time Period 07/31/2022 - 09/01/2022    Authorization - Visit Number 6    Authorization - Number of Visits 10    PT Start Time 0915    PT Stop Time 1000    PT Time Calculation (min) 45 min    Activity Tolerance Patient tolerated treatment well    Behavior During Therapy [facility] for tasks assessed/performed                      Past Medical History:  Diagnosis Date   Torn meniscus    Past Surgical History:  Procedure Laterality Date   ANTERIOR CRUCIATE LIGAMENT REPAIR Right 06/25/2022   Procedure: RECONSTRUCTION ANTERIOR CRUCIATE LIGAMENT (ACL);  Surgeon: Hiram Gash, MD;  Location: [place];  Service: Orthopedics;  Laterality: Right;   KNEE ARTHROSCOPY WITH ANTERIOR CRUCIATE LIGAMENT (ACL) REPAIR Left 02/05/2021   Procedure: KNEE ARTHROSCOPY WITH ANTERIOR CRUCIATE LIGAMENT (ACL) REPAIR WITH AUTOGRAFT;  Surgeon: Hiram Gash, MD;  Location: [place];  Service: Orthopedics;  Laterality: Left;   KNEE ARTHROSCOPY WITH LATERAL MENISECTOMY  02/05/2021   Procedure: KNEE ARTHROSCOPY WITH LATERAL MENISECTOMY;  Surgeon: Hiram Gash, MD;  Location: [place];  Service: Orthopedics;;   KNEE ARTHROSCOPY WITH MEDIAL MENISECTOMY Left 02/05/2021   Procedure: KNEE ARTHROSCOPY WITH  MEDIAL MENISCUS REPAIR;  Surgeon: Hiram Gash, MD;  Location: [place];  Service: Orthopedics;  Laterality: Left;   KNEE ARTHROSCOPY WITH MEDIAL MENISECTOMY Right 06/25/2022   Procedure: KNEE ARTHROSCOPY WITH MEDIAL MENISECTOMY;  Surgeon:  Hiram Gash, MD;  Location: [place];  Service: Orthopedics;  Laterality: Right;   There are no problems to display for this patient.   REFERRING DIAG: Right knee arthroscopy with ACL reconstruction with BTB autograft and medical meniscus repair 8/24  THERAPY DIAG:  Chronic pain of right knee  Muscle weakness (generalized)  Other abnormalities of gait and mobility  Rationale for Evaluation and Treatment Rehabilitation  PERTINENT HISTORY: s/p right ACL reconstruction with BTB autograft, medial meniscectomy, lateral meniscus repair 06/25/2022  PRECAUTIONS: Knee   SUBJECTIVE: Patient reports she is doing well, states a little achiness this morning.  PAIN:  Are you having pain? 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   OBJECTIVE: (objective measures completed at initial evaluation unless otherwise dated) PATIENT SURVEYS:  LEFS: 17/80  07/27/2022: 43/80  EDEMA:  Circumferential: Right: 39 cm, Left: 37 cm    MUSCLE LENGTH: Hamstring, calf, and quad flexibility deficit  LOWER EXTREMITY ROM:   Active ROM Right eval Left eval Right 07/20/2022 Right 07/27/2022 Right 08/03/2022  Knee flexion 60 135 90 deg 117 123  Knee extension 5 lacking 8 hyper  5 hyper 5 hyper     PROM right knee 5-0-90 - 07/07/2022  LOWER EXTREMITY MMT:   Patient with good quad activation, slight extension  lag with SLR  07/01/2022: patient able to perform SLR without lag   MMT Right eval Left eval Right 08/12/2022  Hip flexion 4- 4 4  Hip extension 4- 4 4  Hip abduction 4- 4 4  Knee flexion - 5 4  Knee extension - 5 4    FUNCTIONAL TESTS:  Not assessed   GAIT: - 07/27/2022 Assistive device utilized: None Level of assistance: Independent Comments: grossly WFL     TODAY'S  TREATMENT: [facility] Adult PT Treatment:                                                DATE: 08/17/2022 Therapeutic Exercise: Elliptical L5 R5 x 5 min while taking subjective Rear foot elevated split squat 3 x 12 each Modified side plank on knee with hip abduction 2 x 15 each Front plank on hands 2 x 30 sec Squat on BOSU 2 x 10 Reverse lunge 2 x 12 each SLS with 10# KB pass 3 x 20 each SL heel raises 2 x 20 each Runner step-up on 8" box 2 x 10 each Hex bar deadlift 75# x 8, 85# 3 x 6 Hamstring curl machine  DL: 45# x 8, 55# 2 x 8 SL: 20# x 8, 25# 2 x 8   [facility] Adult PT Treatment:                                                DATE: 08/12/2022 Therapeutic Exercise: Elliptical L5 R5 x 5 min while taking subjective Leg press (cybex) DL: 140# x 10, 160# 2 x 10 SL: 50# 3 x 8 (left 140# 4 x 6) Runner step-up on 8" box 3 x 10 Lateral stepping with green power band manual resistance x 3 lengths  Forward heel tap on 4" box 3 x 10 SL heel raises on edge of step 2 x 20 Hamstring curl machine  DL: 45# x 8, 55# 2 x 8 SL: 15# 2 x 10 Blood flow restriction Position and location of cuff: Seated and proximal thigh Limb occlusion pressure (mmHg): 180 Exercise pressure (mmHg): 144 (80%) Exercise prescription: 00,71,21,97, reps with 30-60 sec rest Exercise comment: LAQ with 3#  [facility] Adult PT Treatment:                                                DATE: 08/10/2022 Therapeutic Exercise: Recumbent bike L5 x 5 min while taking subjective Standing TKE with red power band x 20 Barbell back squat 45# 3 x 10 - partial range, mirror for visual feedback Hex bar deadlift 75# 3 x 6 Heel raises on leg press (cybex) 100# 3 x 20 Modified side plank on knees with hip abduction 3 x 12 each Front plank 3 x 20 sec Leg press (cybex) SL 50# 3 x 8 (left 120# 3 x 8) Blood flow restriction Position and location of cuff: Seated and proximal thigh Limb occlusion pressure (mmHg): 180 Exercise pressure (mmHg): 144  (80%) Exercise prescription: 58,83,25,49, reps with 30-60 sec rest Exercise comment: LAQ with 3#  PATIENT EDUCATION:  Education details: HEP, gym based  strengthening Person educated: Patient Education method: Explanation, Demonstration, Tactile cues, Verbal cues Education comprehension: verbalized understanding, returned demonstration, verbal cues required, tactile cues required, and needs further education   HOME EXERCISE PROGRAM: Access Code: [email or web address]     ASSESSMENT: CLINICAL IMPRESSION: Patient tolerated therapy well with no adverse effects. Therapy continues to focus on progressing LE strength and stability with good tolerance. Incorporated more lunge based movements without resistance and able to increase resistance with deadlift. Cueing provided for proper lunge technique and knee positioning. She seems to be progressing well with her strengthening and instructed in continued gym based strengthening for LE. No changes made to HEP. Patient would benefit from continued skilled PT to progress her mobility and strength in order to reduce pain and maximize functional ability.     OBJECTIVE IMPAIRMENTS Abnormal gait, decreased activity tolerance, decreased balance, difficulty walking, decreased ROM, decreased strength, impaired flexibility, improper body mechanics, and pain.    ACTIVITY LIMITATIONS lifting, bending, standing, squatting, stairs, transfers, and locomotion level   PARTICIPATION LIMITATIONS: meal prep, cleaning, shopping, community activity, and occupation   PERSONAL FACTORS Past/current experiences are also affecting patient's functional outcome.      GOALS: Goals reviewed with patient? Yes   SHORT TERM GOALS: Target date: 07/25/2022    Patient will be I with initial HEP in order to progress with therapy. Baseline: HEP provided at eval 07/27/2022: independent with initial current HEP Goal status: MET   2.  Patient will perform SLR x 10 repetitions without extension  lag in order to improve quad control with ambulation Baseline: good quad activation, slight extension lag with SLR, fatigues with repetitions 07/03/2022: patient able to perform SLR x 10 without lag Goal status: MET   3.  Patient will demonstrate normalize gait mechanics without AD in order to improve walking ability Baseline: patient using bilateral crutches and knee immobilizer locked in extension 07/27/2022: patient ambulating without device Goal status: MET   4.  Patient will demonstrate right knee AROM 5-0-90 deg in order to  Baseline: right knee AROM 5-60 deg 07/27/2022: right knee AROM 5 - 0 - 117 deg Goal status: MET   5. Patient will report pain level </= 2/10 with walking or PT related activities in order to allow progress with therapy and reduce functional limitations.            Baseline: 4/10  07/27/2022: patient denies pain with exercises Goal Status: MET   LONG TERM GOALS: Target date: 08/22/2022    Patient will be I with final HEP to maintain progress from PT. Baseline: HEP provided at eval 07/27/2022: progressing toward final HEP Goal status: ONGOING   2.  Patient will report >/= 56/80 on LEFS in order to indicate improved functional ability without progression to running or jumping tasks Baseline: 17/80 07/27/2022: 43/80 Goal status: PARTIALLY MET   3.  Patient will be able to demonstrate a squat without deviation or pain in order to progress strength and improve ability to perform household tasks Baseline: unable at eval 07/27/2022: patient performing wall squats, requires cues for even weight distribution and proper knee alignment Goal status: PARTIALLY MET   4.  Patient will demonstrate full right knee AROM in order to improve functional mobility and reduce limitations with movement Baseline: knee AROM 5-60 deg at evaluation 07/27/2022: right knee AROM 5 - 0 - 117 deg Goal status: PARTIALLY MET     PLAN: PT FREQUENCY: 1-2x/week   PT DURATION: 8 weeks    PLANNED INTERVENTIONS: Therapeutic exercises, Therapeutic  activity, Neuromuscular re-education, Balance training, Gait training, Patient/Family education, Self Care, Joint mobilization, Joint manipulation, Aquatic Therapy, Dry Needling, Electrical stimulation, Cryotherapy, Moist heat, Taping, Vasopneumatic device, Manual therapy, and Re-evaluation   PLAN FOR NEXT SESSION: Review HEP and progress PRN, progressing quad and hip strengthening, single leg stability   Hilda Blades, PT, DPT, LAT, ATC 08/17/22  10:17 AM Phone: [phone] Fax: [phone]

## 2022-08-19 NOTE — Therapy (Signed)
OUTPATIENT PHYSICAL THERAPY TREATMENT NOTE   Patient Name: Cheryl Cole MRN: [id number] DOB:2001/07/20, 21 y.o., female Today's Date: 08/20/2022  PCP: Lacie Draft, MD   REFERRING PROVIDER: Hiram Gash, MD   END OF SESSION:   PT End of Session - 08/20/22 0841     Visit Number 16    Number of Visits 24    Date for PT Re-Evaluation 10/15/22    Authorization Type [employer]    Authorization Time Period 07/31/2022 - 09/01/2022    Authorization - Visit Number 7    Authorization - Number of Visits 10    PT Start Time 0836    PT Stop Time 0925    PT Time Calculation (min) 49 min    Activity Tolerance Patient tolerated treatment well    Behavior During Therapy [facility] for tasks assessed/performed                       Past Medical History:  Diagnosis Date   Torn meniscus    Past Surgical History:  Procedure Laterality Date   ANTERIOR CRUCIATE LIGAMENT REPAIR Right 06/25/2022   Procedure: RECONSTRUCTION ANTERIOR CRUCIATE LIGAMENT (ACL);  Surgeon: Hiram Gash, MD;  Location: [place];  Service: Orthopedics;  Laterality: Right;   KNEE ARTHROSCOPY WITH ANTERIOR CRUCIATE LIGAMENT (ACL) REPAIR Left 02/05/2021   Procedure: KNEE ARTHROSCOPY WITH ANTERIOR CRUCIATE LIGAMENT (ACL) REPAIR WITH AUTOGRAFT;  Surgeon: Hiram Gash, MD;  Location: [place];  Service: Orthopedics;  Laterality: Left;   KNEE ARTHROSCOPY WITH LATERAL MENISECTOMY  02/05/2021   Procedure: KNEE ARTHROSCOPY WITH LATERAL MENISECTOMY;  Surgeon: Hiram Gash, MD;  Location: [place];  Service: Orthopedics;;   KNEE ARTHROSCOPY WITH MEDIAL MENISECTOMY Left 02/05/2021   Procedure: KNEE ARTHROSCOPY WITH  MEDIAL MENISCUS REPAIR;  Surgeon: Hiram Gash, MD;  Location: [place];  Service: Orthopedics;  Laterality: Left;   KNEE ARTHROSCOPY WITH MEDIAL MENISECTOMY Right 06/25/2022   Procedure: KNEE ARTHROSCOPY WITH MEDIAL MENISECTOMY;  Surgeon:  Hiram Gash, MD;  Location: [place];  Service: Orthopedics;  Laterality: Right;   There are no problems to display for this patient.   REFERRING DIAG: Right knee arthroscopy with ACL reconstruction with BTB autograft and medical meniscus repair 8/24  THERAPY DIAG:  Chronic pain of right knee  Muscle weakness (generalized)  Other abnormalities of gait and mobility  Rationale for Evaluation and Treatment Rehabilitation  PERTINENT HISTORY: s/p right ACL reconstruction with BTB autograft, medial meniscectomy, lateral meniscus repair 06/25/2022  PRECAUTIONS: Knee   SUBJECTIVE: Patient reports her knee is feeling good and she feels she is progressing with exercises.  PAIN:  Are you having pain? 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   OBJECTIVE: (objective measures completed at initial evaluation unless otherwise dated) PATIENT SURVEYS:  LEFS: 17/80  07/27/2022: 43/80  08/20/2022: 52/80  EDEMA:  Circumferential: Right: 39 cm, Left: 37 cm    MUSCLE LENGTH: Hamstring, calf, and quad flexibility deficit  LOWER EXTREMITY ROM:   Active ROM Right eval Left eval Right 07/20/2022 Right 07/27/2022 Right 08/03/2022 Right 08/20/2022  Knee flexion 60 135 90 deg 117 123 135  Knee extension 5 lacking 8 hyper  5 hyper 5 hyper 5 hyper     PROM right knee 5-0-90 - 07/07/2022  LOWER EXTREMITY MMT:   Patient with good quad activation, slight extension lag with SLR  07/01/2022: patient able to perform SLR without lag   MMT Right eval Left eval Right 08/12/2022  Hip flexion 4- 4 4  Hip extension 4- 4 4  Hip abduction 4- 4 4  Knee flexion - 5 4  Knee extension - 5 4    FUNCTIONAL TESTS:  Not assessed   GAIT: - 07/27/2022 Assistive device utilized: None Level of  assistance: Independent Comments: grossly WFL     TODAY'S TREATMENT: [facility] Adult PT Treatment:                                                DATE: 08/20/2022 Therapeutic Exercise: Elliptical L5 R5 x 5 min while taking subjective Prone quad stretch with strap 3 x 30 sec Barbell back squat 65# 3 x 8 Hip thrust 45# 3 x 10 Rear foot elevated split squat 3 x 10 each Modified side plank on knee with hip abduction 2 x 15 each Front plank on hands 2 x 30 sec Longsitting quad set with heel prop x 10 focus on hyperextension Lateral band walk with black below knees 3 x 20 down/back Bridge with physioball hamstring curl 3 x 10 SLS with 10# KB pass 3 x 20 each Runner step-up on 8" box holding 5# each 3 x 8 each Blood flow restriction Position and location of cuff: Seated and proximal thigh Limb occlusion pressure (mmHg): 180 Exercise pressure (mmHg): 144 (80%) Exercise prescription: 54,65,03,54, reps with 30-60 sec rest Exercise comment: LAQ with 5#   [facility] Adult PT Treatment:                                                DATE: 08/17/2022 Therapeutic Exercise: Elliptical L5 R5 x 5 min while taking subjective Rear foot elevated split squat 3 x 12 each Modified side plank on knee with hip abduction 2 x 15 each Front plank on hands 2 x 30 sec Squat on BOSU 2 x 10 Reverse lunge 2 x 12 each SLS with 10# KB pass 3 x 20 each SL heel raises 2 x 20 each Runner step-up on 8" box 2 x 10 each Hex bar deadlift 75# x 8, 85# 3 x 6 Hamstring curl machine  DL: 45# x 8, 55# 2 x 8 SL: 20# x 8, 25# 2 x 8  [facility] Adult PT Treatment:                                                DATE: 08/12/2022 Therapeutic Exercise: Elliptical L5 R5 x 5 min while taking subjective Leg press (cybex) DL: 140# x 10, 160# 2 x 10 SL: 50# 3 x 8 (left 140# 4 x 6) Runner step-up on 8" box 3 x 10 Lateral stepping with green power band manual resistance x 3 lengths  Forward heel tap on 4" box 3 x 10 SL heel raises on edge of  step 2 x 20 Hamstring curl machine  DL: 45# x 8, 55# 2 x 8 SL: 15# 2 x 10  Blood flow restriction Position and location of cuff: Seated and proximal thigh Limb occlusion pressure (mmHg): 180 Exercise pressure (mmHg): 144 (80%) Exercise prescription: 29,92,42,68, reps with 30-60 sec rest Exercise comment: LAQ with 3#  PATIENT EDUCATION:  Education details: HEP, gym based strengthening Person educated: Patient Education method: Explanation, Demonstration, Tactile cues, Verbal cues Education comprehension: verbalized understanding, returned demonstration, verbal cues required, tactile cues required, and needs further education   HOME EXERCISE PROGRAM: Access Code: [email or web address]     ASSESSMENT: CLINICAL IMPRESSION: Patient tolerated therapy well with no adverse effects. Therapy focuses on continued strengthening and stability exercises with good tolerance. She demonstrates normalized knee flexion and continues to exhibit the ability to actively hyperextend the knee. She reports improvement in functional ability on LEFS and is progressing nicely with her strengthening. She was instructed on continued squat progression and gradually increasing resistance with exercises at the gym. No changes to her HEP this visit.  Patient would benefit from continued skilled PT to progress her mobility and strength in order to reduce pain and maximize functional ability so will extend PT POC for 8 more weeks at frequency of 1x/week.     OBJECTIVE IMPAIRMENTS Abnormal gait, decreased activity tolerance, decreased balance, difficulty walking, decreased ROM, decreased strength, impaired flexibility, improper body mechanics, and pain.    ACTIVITY LIMITATIONS lifting, bending, standing, squatting, stairs, transfers, and locomotion level   PARTICIPATION LIMITATIONS: meal prep, cleaning, shopping, community activity, and occupation   PERSONAL FACTORS Past/current experiences are also affecting patient's functional  outcome.      GOALS: Goals reviewed with patient? Yes   SHORT TERM GOALS: Target date: 07/25/2022    Patient will be I with initial HEP in order to progress with therapy. Baseline: HEP provided at eval 07/27/2022: independent with initial current HEP Goal status: MET   2.  Patient will perform SLR x 10 repetitions without extension lag in order to improve quad control with ambulation Baseline: good quad activation, slight extension lag with SLR, fatigues with repetitions 07/03/2022: patient able to perform SLR x 10 without lag Goal status: MET   3.  Patient will demonstrate normalize gait mechanics without AD in order to improve walking ability Baseline: patient using bilateral crutches and knee immobilizer locked in extension 07/27/2022: patient ambulating without device Goal status: MET   4.  Patient will demonstrate right knee AROM 5-0-90 deg in order to  Baseline: right knee AROM 5-60 deg 07/27/2022: right knee AROM 5 - 0 - 117 deg Goal status: MET   5. Patient will report pain level </= 2/10 with walking or PT related activities in order to allow progress with therapy and reduce functional limitations.            Baseline: 4/10  07/27/2022: patient denies pain with exercises Goal Status: MET   LONG TERM GOALS: Target date: 10/15/2022   Patient will be I with final HEP to maintain progress from PT. Baseline: HEP provided at eval 07/27/2022: progressing toward final HEP 08/20/2022: progressing Goal status: ONGOING   2.  Patient will report >/= 56/80 on LEFS in order to indicate improved functional ability without progression to running or jumping tasks Baseline: 17/80 07/27/2022: 43/80 08/20/2022: 54/80 Goal status: PARTIALLY MET   3.  Patient will be able to demonstrate a squat without deviation or pain in order to progress strength and improve ability to perform household tasks Baseline: unable at eval 07/27/2022: patient performing wall squats, requires cues for even  weight distribution and proper knee alignment 08/20/2022:  progressing weighted squats Goal status: PARTIALLY MET   4.  Patient will demonstrate full right knee AROM in order to improve functional mobility and reduce limitations with movement Baseline: knee AROM 5-60 deg at evaluation 07/27/2022: right knee AROM 5 - 0 - 117 deg 08/20/2022: patient demonstrates full AROM Goal status: MET     PLAN: PT FREQUENCY: 1x/week   PT DURATION: 8 weeks   PLANNED INTERVENTIONS: Therapeutic exercises, Therapeutic activity, Neuromuscular re-education, Balance training, Gait training, Patient/Family education, Self Care, Joint mobilization, Joint manipulation, Aquatic Therapy, Dry Needling, Electrical stimulation, Cryotherapy, Moist heat, Taping, Vasopneumatic device, Manual therapy, and Re-evaluation   PLAN FOR NEXT SESSION: Review HEP and progress PRN, progressing quad and hip strengthening, single leg stability   Hilda Blades, PT, DPT, LAT, ATC 08/20/22  9:50 AM Phone: [phone] Fax: [phone]

## 2022-08-20 ENCOUNTER — Other Ambulatory Visit: Payer: Self-pay

## 2022-08-20 ENCOUNTER — Encounter: Payer: Self-pay | Admitting: Physical Therapy

## 2022-08-20 ENCOUNTER — Ambulatory Visit: Payer: 59 | Admitting: Physical Therapy

## 2022-08-20 DIAGNOSIS — M6281 Muscle weakness (generalized): Secondary | ICD-10-CM

## 2022-08-20 DIAGNOSIS — M25561 Pain in right knee: Secondary | ICD-10-CM | POA: Diagnosis not present

## 2022-08-20 DIAGNOSIS — G8929 Other chronic pain: Secondary | ICD-10-CM | POA: Diagnosis not present

## 2022-08-20 DIAGNOSIS — R2689 Other abnormalities of gait and mobility: Secondary | ICD-10-CM

## 2022-08-24 ENCOUNTER — Ambulatory Visit: Payer: 59 | Admitting: Physical Therapy

## 2022-08-24 ENCOUNTER — Encounter: Payer: Self-pay | Admitting: Physical Therapy

## 2022-08-24 DIAGNOSIS — G8929 Other chronic pain: Secondary | ICD-10-CM

## 2022-08-24 DIAGNOSIS — M6281 Muscle weakness (generalized): Secondary | ICD-10-CM | POA: Diagnosis not present

## 2022-08-24 DIAGNOSIS — R2689 Other abnormalities of gait and mobility: Secondary | ICD-10-CM | POA: Diagnosis not present

## 2022-08-24 DIAGNOSIS — M25561 Pain in right knee: Secondary | ICD-10-CM | POA: Diagnosis not present

## 2022-08-24 NOTE — Therapy (Signed)
OUTPATIENT PHYSICAL THERAPY TREATMENT NOTE   Patient Name: Cheryl Cole MRN: [id number] DOB:[birth year], 21 y.o., female Today's Date: 08/24/2022  PCP: Lacie Draft, MD   REFERRING PROVIDER: Hiram Gash, MD   END OF SESSION:   PT End of Session - 08/24/22 0847     Visit Number 17    Number of Visits 24    Date for PT Re-Evaluation 10/15/22    Authorization Type [employer]    Authorization Time Period 07/31/2022 - 09/01/2022    Authorization - Visit Number 8    Authorization - Number of Visits 10    PT Start Time 0845    PT Stop Time 0940    PT Time Calculation (min) 55 min                       Past Medical History:  Diagnosis Date   Torn meniscus    Past Surgical History:  Procedure Laterality Date   ANTERIOR CRUCIATE LIGAMENT REPAIR Right 06/25/2022   Procedure: RECONSTRUCTION ANTERIOR CRUCIATE LIGAMENT (ACL);  Surgeon: Hiram Gash, MD;  Location: [place];  Service: Orthopedics;  Laterality: Right;   KNEE ARTHROSCOPY WITH ANTERIOR CRUCIATE LIGAMENT (ACL) REPAIR Left 02/05/2021   Procedure: KNEE ARTHROSCOPY WITH ANTERIOR CRUCIATE LIGAMENT (ACL) REPAIR WITH AUTOGRAFT;  Surgeon: Hiram Gash, MD;  Location: [place];  Service: Orthopedics;  Laterality: Left;   KNEE ARTHROSCOPY WITH LATERAL MENISECTOMY  02/05/2021   Procedure: KNEE ARTHROSCOPY WITH LATERAL MENISECTOMY;  Surgeon: Hiram Gash, MD;  Location: [place];  Service: Orthopedics;;   KNEE ARTHROSCOPY WITH MEDIAL MENISECTOMY Left 02/05/2021   Procedure: KNEE ARTHROSCOPY WITH  MEDIAL MENISCUS REPAIR;  Surgeon: Hiram Gash, MD;  Location: [place];  Service: Orthopedics;  Laterality: Left;   KNEE ARTHROSCOPY WITH MEDIAL MENISECTOMY Right 06/25/2022   Procedure: KNEE ARTHROSCOPY WITH MEDIAL MENISECTOMY;  Surgeon: Hiram Gash, MD;  Location: [place];  Service: Orthopedics;  Laterality: Right;   There are no  problems to display for this patient.   REFERRING DIAG: Right knee arthroscopy with ACL reconstruction with BTB autograft and medical meniscus repair 8/24  THERAPY DIAG:  Chronic pain of right knee  Muscle weakness (generalized)  Rationale for Evaluation and Treatment Rehabilitation  PERTINENT HISTORY: s/p right ACL reconstruction with BTB autograft, medial meniscectomy, lateral meniscus repair 06/25/2022  PRECAUTIONS: Knee   SUBJECTIVE: No knee pain, did wake up with a strain in my back today, 5/10 pain. It usually lasts a couple of days.   PAIN:  Are you having pain? 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   OBJECTIVE: (objective measures completed at initial evaluation unless otherwise dated) PATIENT SURVEYS:  LEFS: 17/80  07/27/2022: 43/80  08/20/2022: 52/80  EDEMA:  Circumferential: Right: 39 cm, Left: 37 cm    MUSCLE LENGTH: Hamstring, calf, and quad flexibility deficit  LOWER EXTREMITY ROM:   Active ROM Right eval Left eval Right 07/20/2022 Right 07/27/2022 Right 08/03/2022 Right 08/20/2022  Knee flexion 60 135 90 deg 117 123 135  Knee extension 5 lacking 8 hyper  5 hyper 5 hyper 5 hyper     PROM right knee 5-0-90 - 07/07/2022  LOWER EXTREMITY MMT:   Patient with good quad activation, slight extension lag with SLR  07/01/2022: patient  able to perform SLR without lag   MMT Right eval Left eval Right 08/12/2022  Hip flexion 4- 4 4  Hip extension 4- 4 4  Hip abduction 4- 4 4  Knee flexion - 5 4  Knee extension - 5 4    FUNCTIONAL TESTS:  Not assessed   GAIT: - 07/27/2022 Assistive device utilized: None Level of assistance: Independent Comments: grossly WFL     TODAY'S TREATMENT: [facility] Adult PT Treatment:                                                 DATE: 08/24/2022 Therapeutic Exercise: Elliptical L5 R5 x 5 min while taking subjective Prone quad stretch with strap 3 x 30 sec Barbell back squat 65# 3 x 8 Hex bar deadlift 85# 3 x 8 SLS with 10# KB pass 3 x 20 each Rear foot elevated split squat 3 x 10 each, 5# KB Lateral band walk with black below knees 3 x 20 down/back Runner step-up on 8" box holding 5# each 2 x 8 each Childs Pose at end of session to reduce lumbar tension and pain Modalities: HMP to lumbar x 10 minutes      [facility] Adult PT Treatment:                                                DATE: 08/20/2022 Therapeutic Exercise: Elliptical L5 R5 x 5 min while taking subjective Prone quad stretch with strap 3 x 30 sec Barbell back squat 65# 3 x 8 Hip thrust 45# 3 x 10 Rear foot elevated split squat 3 x 10 each Modified side plank on knee with hip abduction 2 x 15 each Front plank on hands 2 x 30 sec Longsitting quad set with heel prop x 10 focus on hyperextension Lateral band walk with black below knees 3 x 20 down/back Bridge with physioball hamstring curl 3 x 10 SLS with 10# KB pass 3 x 20 each Runner step-up on 8" box holding 5# each 3 x 8 each Blood flow restriction Position and location of cuff: Seated and proximal thigh Limb occlusion pressure (mmHg): 180 Exercise pressure (mmHg): 144 (80%) Exercise prescription: 99,35,70,17, reps with 30-60 sec rest Exercise comment: LAQ with 5#   [facility] Adult PT Treatment:                                                DATE: 08/17/2022 Therapeutic Exercise: Elliptical L5 R5 x 5 min while taking subjective Rear foot elevated split squat 3 x 12 each Modified side plank on knee with hip abduction 2 x 15 each Front plank on hands 2 x 30 sec Squat on BOSU 2 x 10 Reverse lunge 2 x 12 each SLS with 10# KB pass 3 x 20 each SL heel raises 2 x 20 each Runner step-up on 8" box 2 x 10 each Hex bar deadlift 75# x 8, 85# 3 x 6 Hamstring curl machine  DL: 45# x 8, 55# 2 x 8 SL: 20#  x 8, 25# 2 x 8  [facility] Adult PT Treatment:  DATE: 08/12/2022 Therapeutic Exercise: Elliptical L5 R5 x 5 min while taking subjective Leg press (cybex) DL: 140# x 10, 160# 2 x 10 SL: 50# 3 x 8 (left 140# 4 x 6) Runner step-up on 8" box 3 x 10 Lateral stepping with green power band manual resistance x 3 lengths  Forward heel tap on 4" box 3 x 10 SL heel raises on edge of step 2 x 20 Hamstring curl machine  DL: 45# x 8, 55# 2 x 8 SL: 15# 2 x 10 Blood flow restriction Position and location of cuff: Seated and proximal thigh Limb occlusion pressure (mmHg): 180 Exercise pressure (mmHg): 144 (80%) Exercise prescription: 21,30,86,57, reps with 30-60 sec rest Exercise comment: LAQ with 3#  PATIENT EDUCATION:  Education details: HEP, gym based strengthening Person educated: Patient Education method: Explanation, Demonstration, Tactile cues, Verbal cues Education comprehension: verbalized understanding, returned demonstration, verbal cues required, tactile cues required, and needs further education   HOME EXERCISE PROGRAM: Access Code: [email or web address]     ASSESSMENT: CLINICAL IMPRESSION: Patient tolerated therapy with increased back pain. Omitted some therex today due to back pain on arrival. HMP applied to lumbar at end of session. Patient would benefit from continued skilled PT to progress her mobility and strength in order to reduce pain and maximize functional ability.     OBJECTIVE IMPAIRMENTS Abnormal gait, decreased activity tolerance, decreased balance, difficulty walking, decreased ROM, decreased strength, impaired flexibility, improper body mechanics, and pain.    ACTIVITY LIMITATIONS lifting, bending, standing, squatting, stairs, transfers, and locomotion level   PARTICIPATION LIMITATIONS: meal prep, cleaning, shopping, community activity, and occupation   PERSONAL FACTORS Past/current experiences are also affecting patient's functional  outcome.      GOALS: Goals reviewed with patient? Yes   SHORT TERM GOALS: Target date: 07/25/2022    Patient will be I with initial HEP in order to progress with therapy. Baseline: HEP provided at eval 07/27/2022: independent with initial current HEP Goal status: MET   2.  Patient will perform SLR x 10 repetitions without extension lag in order to improve quad control with ambulation Baseline: good quad activation, slight extension lag with SLR, fatigues with repetitions 07/03/2022: patient able to perform SLR x 10 without lag Goal status: MET   3.  Patient will demonstrate normalize gait mechanics without AD in order to improve walking ability Baseline: patient using bilateral crutches and knee immobilizer locked in extension 07/27/2022: patient ambulating without device Goal status: MET   4.  Patient will demonstrate right knee AROM 5-0-90 deg in order to  Baseline: right knee AROM 5-60 deg 07/27/2022: right knee AROM 5 - 0 - 117 deg Goal status: MET   5. Patient will report pain level </= 2/10 with walking or PT related activities in order to allow progress with therapy and reduce functional limitations.            Baseline: 4/10  07/27/2022: patient denies pain with exercises Goal Status: MET   LONG TERM GOALS: Target date: 10/15/2022   Patient will be I with final HEP to maintain progress from PT. Baseline: HEP provided at eval 07/27/2022: progressing toward final HEP 08/20/2022: progressing Goal status: ONGOING   2.  Patient will report >/= 56/80 on LEFS in order to indicate improved functional ability without progression to running or jumping tasks Baseline: 17/80 07/27/2022: 43/80 08/20/2022: 54/80 Goal status: PARTIALLY MET   3.  Patient will be able to demonstrate a squat without deviation or pain in order to progress strength  and improve ability to perform household tasks Baseline: unable at eval 07/27/2022: patient performing wall squats, requires cues for even  weight distribution and proper knee alignment 08/20/2022: progressing weighted squats Goal status: PARTIALLY MET   4.  Patient will demonstrate full right knee AROM in order to improve functional mobility and reduce limitations with movement Baseline: knee AROM 5-60 deg at evaluation 07/27/2022: right knee AROM 5 - 0 - 117 deg 08/20/2022: patient demonstrates full AROM Goal status: MET     PLAN: PT FREQUENCY: 1x/week   PT DURATION: 8 weeks   PLANNED INTERVENTIONS: Therapeutic exercises, Therapeutic activity, Neuromuscular re-education, Balance training, Gait training, Patient/Family education, Self Care, Joint mobilization, Joint manipulation, Aquatic Therapy, Dry Needling, Electrical stimulation, Cryotherapy, Moist heat, Taping, Vasopneumatic device, Manual therapy, and Re-evaluation   PLAN FOR NEXT SESSION: Review HEP and progress PRN, progressing quad and hip strengthening, single leg stability   Hessie Diener, PTA 08/24/22 10:43 AM Phone: [phone] Fax: [phone]

## 2022-09-01 ENCOUNTER — Other Ambulatory Visit: Payer: Self-pay

## 2022-09-01 ENCOUNTER — Encounter: Payer: Self-pay | Admitting: Physical Therapy

## 2022-09-01 ENCOUNTER — Ambulatory Visit: Payer: 59 | Admitting: Physical Therapy

## 2022-09-01 DIAGNOSIS — R2689 Other abnormalities of gait and mobility: Secondary | ICD-10-CM

## 2022-09-01 DIAGNOSIS — M6281 Muscle weakness (generalized): Secondary | ICD-10-CM

## 2022-09-01 DIAGNOSIS — M25561 Pain in right knee: Secondary | ICD-10-CM | POA: Diagnosis not present

## 2022-09-01 DIAGNOSIS — G8929 Other chronic pain: Secondary | ICD-10-CM | POA: Diagnosis not present

## 2022-09-01 NOTE — Therapy (Signed)
OUTPATIENT PHYSICAL THERAPY TREATMENT NOTE   Patient Name: Cheryl Cole MRN: [id number] DOB:2001-02-12, 21 y.o., female Today's Date: 09/01/2022  PCP: Lacie Draft, MD   REFERRING PROVIDER: Hiram Gash, MD   END OF SESSION:   PT End of Session - 09/01/22 0832     Visit Number 18    Number of Visits 24    Date for PT Re-Evaluation 10/15/22    Authorization Type [employer]    Authorization Time Period 07/31/2022 - 09/01/2022    Authorization - Visit Number 9    Authorization - Number of Visits 10    PT Start Time 0828    PT Stop Time 0915    PT Time Calculation (min) 47 min    Activity Tolerance Patient tolerated treatment well    Behavior During Therapy [facility] for tasks assessed/performed                        Past Medical History:  Diagnosis Date   Torn meniscus    Past Surgical History:  Procedure Laterality Date   ANTERIOR CRUCIATE LIGAMENT REPAIR Right 06/25/2022   Procedure: RECONSTRUCTION ANTERIOR CRUCIATE LIGAMENT (ACL);  Surgeon: Hiram Gash, MD;  Location: [place];  Service: Orthopedics;  Laterality: Right;   KNEE ARTHROSCOPY WITH ANTERIOR CRUCIATE LIGAMENT (ACL) REPAIR Left 02/05/2021   Procedure: KNEE ARTHROSCOPY WITH ANTERIOR CRUCIATE LIGAMENT (ACL) REPAIR WITH AUTOGRAFT;  Surgeon: Hiram Gash, MD;  Location: [place];  Service: Orthopedics;  Laterality: Left;   KNEE ARTHROSCOPY WITH LATERAL MENISECTOMY  02/05/2021   Procedure: KNEE ARTHROSCOPY WITH LATERAL MENISECTOMY;  Surgeon: Hiram Gash, MD;  Location: [place];  Service: Orthopedics;;   KNEE ARTHROSCOPY WITH MEDIAL MENISECTOMY Left 02/05/2021   Procedure: KNEE ARTHROSCOPY WITH  MEDIAL MENISCUS REPAIR;  Surgeon: Hiram Gash, MD;  Location: [place];  Service: Orthopedics;  Laterality: Left;   KNEE ARTHROSCOPY WITH MEDIAL MENISECTOMY Right 06/25/2022   Procedure: KNEE ARTHROSCOPY WITH MEDIAL MENISECTOMY;  Surgeon:  Hiram Gash, MD;  Location: [place];  Service: Orthopedics;  Laterality: Right;   There are no problems to display for this patient.   REFERRING DIAG: Right knee arthroscopy with ACL reconstruction with BTB autograft and medical meniscus repair 8/24  THERAPY DIAG:  Chronic pain of right knee  Muscle weakness (generalized)  Other abnormalities of gait and mobility  Rationale for Evaluation and Treatment Rehabilitation  PERTINENT HISTORY: s/p right ACL reconstruction with BTB autograft, medial meniscectomy, lateral meniscus repair 06/25/2022  PRECAUTIONS: Knee   SUBJECTIVE: Patient reports she is doing well. Her back is feeling better today. Denies any pain.   PAIN:  Are you having pain? 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   OBJECTIVE: (objective measures completed at initial evaluation unless otherwise dated) PATIENT SURVEYS:  LEFS: 17/80  07/27/2022: 43/80  08/20/2022: 52/80  EDEMA:  Circumferential: Right: 39 cm, Left: 37 cm    MUSCLE LENGTH: Hamstring, calf, and quad flexibility deficit  LOWER EXTREMITY ROM:   Active ROM Right eval Left eval Right 07/20/2022 Right 07/27/2022 Right 08/03/2022 Right 08/20/2022  Knee flexion 60 135 90 deg 117 123 135  Knee extension 5 lacking 8 hyper  5 hyper 5 hyper 5 hyper     PROM right knee 5-0-90 -  07/07/2022  LOWER EXTREMITY MMT:   Patient with good quad activation, slight extension lag with SLR  07/01/2022: patient able to perform SLR without lag   MMT Right eval Left eval Right 08/12/2022 Right 09/01/2022  Hip flexion 4- 4 4   Hip extension 4- 4 4   Hip abduction 4- 4 4   Knee flexion - 5 4   Knee extension - 5 4 4    FUNCTIONAL TESTS:  Not assessed   GAIT: - 07/27/2022 Assistive device  utilized: None Level of assistance: Independent Comments: grossly WFL     TODAY'S TREATMENT: [facility] Adult PT Treatment:                                                DATE: 09/01/2022 Therapeutic Exercise: Elliptical L5 R5 x 5 min while taking subjective Hex bar deadlift 95# x 8, 115# 3 x 8 TRX SL squat 3 x 10 Lateral band walk with black below knees 3 x 20 down/back SL leg press (cybex): 50# x 8, 60# 3 x 8 (left 140# 3 x 8) SL heel raises on edge of step 3 x 20 SL knee extension machine 10# 3 x 10 (left 45# 3 x 10)   [facility] Adult PT Treatment:                                                DATE: 08/24/2022 Therapeutic Exercise: Elliptical L5 R5 x 5 min while taking subjective Prone quad stretch with strap 3 x 30 sec Barbell back squat 65# 3 x 8 Hex bar deadlift 85# 3 x 8 SLS with 10# KB pass 3 x 20 each Rear foot elevated split squat 3 x 10 each, 5# KB Lateral band walk with black below knees 3 x 20 down/back Runner step-up on 8" box holding 5# each 2 x 8 each Childs Pose at end of session to reduce lumbar tension and pain Modalities: HMP to lumbar x 10 minutes     [facility] Adult PT Treatment:                                                DATE: 08/20/2022 Therapeutic Exercise: Elliptical L5 R5 x 5 min while taking subjective Prone quad stretch with strap 3 x 30 sec Barbell back squat 65# 3 x 8 Hip thrust 45# 3 x 10 Rear foot elevated split squat 3 x 10 each Modified side plank on knee with hip abduction 2 x 15 each Front plank on hands 2 x 30 sec Longsitting quad set with heel prop x 10 focus on hyperextension Lateral band walk with black below knees 3 x 20 down/back Bridge with physioball hamstring curl 3 x 10 SLS with 10# KB pass 3 x 20 each Runner step-up on 8" box holding 5# each 3 x 8 each Blood flow restriction Position and location of cuff: Seated and proximal thigh Limb occlusion pressure (mmHg): 180 Exercise pressure (mmHg): 144 (80%) Exercise prescription:  30,15,15,15, reps with 30-60 sec rest Exercise comment: LAQ with 5#  [facility] Adult PT Treatment:                                                  DATE: 08/17/2022 Therapeutic Exercise: Elliptical L5 R5 x 5 min while taking subjective Rear foot elevated split squat 3 x 12 each Modified side plank on knee with hip abduction 2 x 15 each Front plank on hands 2 x 30 sec Squat on BOSU 2 x 10 Reverse lunge 2 x 12 each SLS with 10# KB pass 3 x 20 each SL heel raises 2 x 20 each Runner step-up on 8" box 2 x 10 each Hex bar deadlift 75# x 8, 85# 3 x 6 Hamstring curl machine  DL: 45# x 8, 55# 2 x 8 SL: 20# x 8, 25# 2 x 8  PATIENT EDUCATION:  Education details: HEP, continue gym based strengthening Person educated: Patient Education method: Explanation, Demonstration, Tactile cues, Verbal cues Education comprehension: verbalized understanding, returned demonstration, verbal cues required, tactile cues required, and needs further education   HOME EXERCISE PROGRAM: Access Code: [email or web address]     ASSESSMENT: CLINICAL IMPRESSION: Patient tolerated therapy well with no adverse effects. Therapy continues to focus on progression of strengthening and single leg exercises. She is tolerating increased resistance with exercises and is progressing with single leg squatting. She continues to exhibit difficulty and decreased depth with single leg squatting and gross strength deficit of the right quad. No changes to HEP but patient continue to be encouraged to progress strengthening at gym. Patient would benefit from continued skilled PT to progress her mobility and strength in order to reduce pain and maximize functional ability.    OBJECTIVE IMPAIRMENTS Abnormal gait, decreased activity tolerance, decreased balance, difficulty walking, decreased ROM, decreased strength, impaired flexibility, improper body mechanics, and pain.    ACTIVITY LIMITATIONS lifting, bending, standing, squatting, stairs, transfers, and  locomotion level   PARTICIPATION LIMITATIONS: meal prep, cleaning, shopping, community activity, and occupation   PERSONAL FACTORS Past/current experiences are also affecting patient's functional outcome.      GOALS: Goals reviewed with patient? Yes   SHORT TERM GOALS: Target date: 07/25/2022    Patient will be I with initial HEP in order to progress with therapy. Baseline: HEP provided at eval 07/27/2022: independent with initial current HEP Goal status: MET   2.  Patient will perform SLR x 10 repetitions without extension lag in order to improve quad control with ambulation Baseline: good quad activation, slight extension lag with SLR, fatigues with repetitions 07/03/2022: patient able to perform SLR x 10 without lag Goal status: MET   3.  Patient will demonstrate normalize gait mechanics without AD in order to improve walking ability Baseline: patient using bilateral crutches and knee immobilizer locked in extension 07/27/2022: patient ambulating without device Goal status: MET   4.  Patient will demonstrate right knee AROM 5-0-90 deg in order to  Baseline: right knee AROM 5-60 deg 07/27/2022: right knee AROM 5 - 0 - 117 deg Goal status: MET   5. Patient will report pain level </= 2/10 with walking or PT related activities in order to allow progress with therapy and reduce functional limitations.            Baseline: 4/10  07/27/2022: patient denies pain with exercises Goal Status: MET   LONG TERM GOALS: Target date: 10/15/2022   Patient will be I with final HEP to maintain progress from PT. Baseline: HEP provided at eval 07/27/2022: progressing toward final HEP 08/20/2022: progressing Goal status: ONGOING   2.  Patient will report >/= 56/80 on LEFS in order to indicate improved functional ability without progression to running or jumping tasks Baseline: 17/80 07/27/2022:  43/80 08/20/2022: 54/80 Goal status: PARTIALLY MET   3.  Patient will be able to demonstrate a squat  without deviation or pain in order to progress strength and improve ability to perform household tasks Baseline: unable at eval 07/27/2022: patient performing wall squats, requires cues for even weight distribution and proper knee alignment 08/20/2022: progressing weighted squats Goal status: PARTIALLY MET   4.  Patient will demonstrate full right knee AROM in order to improve functional mobility and reduce limitations with movement Baseline: knee AROM 5-60 deg at evaluation 07/27/2022: right knee AROM 5 - 0 - 117 deg 08/20/2022: patient demonstrates full AROM Goal status: MET     PLAN: PT FREQUENCY: 1x/week   PT DURATION: 8 weeks   PLANNED INTERVENTIONS: Therapeutic exercises, Therapeutic activity, Neuromuscular re-education, Balance training, Gait training, Patient/Family education, Self Care, Joint mobilization, Joint manipulation, Aquatic Therapy, Dry Needling, Electrical stimulation, Cryotherapy, Moist heat, Taping, Vasopneumatic device, Manual therapy, and Re-evaluation   PLAN FOR NEXT SESSION: Review HEP and progress PRN, progressing quad and hip strengthening, single leg stability   Hilda Blades, PT, DPT, LAT, ATC 09/01/22  9:27 AM Phone: [phone] Fax: [phone]

## 2022-09-07 NOTE — Therapy (Signed)
OUTPATIENT PHYSICAL THERAPY TREATMENT NOTE   Patient Name: Cheryl Cole MRN: [id number] DOB:2001/03/13, 21 y.o., female Today's Date: 09/08/2022  PCP: Lacie Draft, MD   REFERRING PROVIDER: Hiram Gash, MD   END OF SESSION:   PT End of Session - 09/08/22 0819     Visit Number 19    Number of Visits 24    Date for PT Re-Evaluation 10/15/22    Authorization Type [employer]    Authorization Time Period --    PT Start Time 0825    PT Stop Time 0915    PT Time Calculation (min) 50 min    Activity Tolerance Patient tolerated treatment well    Behavior During Therapy [facility] for tasks assessed/performed                         Past Medical History:  Diagnosis Date   Torn meniscus    Past Surgical History:  Procedure Laterality Date   ANTERIOR CRUCIATE LIGAMENT REPAIR Right 06/25/2022   Procedure: RECONSTRUCTION ANTERIOR CRUCIATE LIGAMENT (ACL);  Surgeon: Hiram Gash, MD;  Location: [place];  Service: Orthopedics;  Laterality: Right;   KNEE ARTHROSCOPY WITH ANTERIOR CRUCIATE LIGAMENT (ACL) REPAIR Left 02/05/2021   Procedure: KNEE ARTHROSCOPY WITH ANTERIOR CRUCIATE LIGAMENT (ACL) REPAIR WITH AUTOGRAFT;  Surgeon: Hiram Gash, MD;  Location: [place];  Service: Orthopedics;  Laterality: Left;   KNEE ARTHROSCOPY WITH LATERAL MENISECTOMY  02/05/2021   Procedure: KNEE ARTHROSCOPY WITH LATERAL MENISECTOMY;  Surgeon: Hiram Gash, MD;  Location: [place];  Service: Orthopedics;;   KNEE ARTHROSCOPY WITH MEDIAL MENISECTOMY Left 02/05/2021   Procedure: KNEE ARTHROSCOPY WITH  MEDIAL MENISCUS REPAIR;  Surgeon: Hiram Gash, MD;  Location: [place];  Service: Orthopedics;  Laterality: Left;   KNEE ARTHROSCOPY WITH MEDIAL MENISECTOMY Right 06/25/2022   Procedure: KNEE ARTHROSCOPY WITH MEDIAL MENISECTOMY;  Surgeon: Hiram Gash, MD;  Location: [place];  Service: Orthopedics;  Laterality:  Right;   There are no problems to display for this patient.   REFERRING DIAG: Right knee arthroscopy with ACL reconstruction with BTB autograft and medical meniscus repair 8/24  THERAPY DIAG:  Chronic pain of right knee  Muscle weakness (generalized)  Other abnormalities of gait and mobility  Rationale for Evaluation and Treatment Rehabilitation  PERTINENT HISTORY: s/p right ACL reconstruction with BTB autograft, medial meniscectomy, lateral meniscus repair 06/25/2022  PRECAUTIONS: Knee   SUBJECTIVE: Patient reports she is doing well. She has noticed some achiness with heavier strengthening.  PAIN:  Are you having pain? 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   OBJECTIVE: (objective measures completed at initial evaluation unless otherwise dated) PATIENT SURVEYS:  LEFS: 17/80  07/27/2022: 43/80  08/20/2022: 52/80  EDEMA:  Circumferential: Right: 39 cm, Left: 37 cm    MUSCLE LENGTH: Hamstring, calf, and quad flexibility deficit  LOWER EXTREMITY ROM:   Active ROM Right eval Left eval Right 07/20/2022 Right 07/27/2022 Right 08/03/2022 Right 08/20/2022  Knee flexion 60 135 90 deg 117 123 135  Knee extension 5 lacking 8 hyper  5 hyper 5 hyper 5 hyper     PROM right knee 5-0-90 - 07/07/2022  LOWER EXTREMITY MMT:   Patient with good quad activation, slight extension lag with SLR  07/01/2022: patient  able to perform SLR without lag   MMT Right eval Left eval Right 08/12/2022 Right 09/01/2022 Right 09/08/2022  Hip flexion 4- 4 4    Hip extension 4- 4 4    Hip abduction 4- 4 4    Knee flexion - 5 4    Knee extension - 5 4 4 4    FUNCTIONAL TESTS:  Not assessed   GAIT: - 07/27/2022 Assistive device utilized: None Level of assistance: Independent Comments: grossly  WFL     TODAY'S TREATMENT: [facility] Adult PT Treatment:                                                DATE: 09/08/2022 Therapeutic Exercise: Elliptical L5 R5 x 5 min while taking subjective Hex bar deadlift 95# x 8, 115# x 8, 125# 3 x 6 Forward 8" step-up with 15# bilat 3 x 10 Lateral band walk with black at mid shin 3 x 20 down/back SL squat to 24" surface 3 x 6 right SL RDL with 10# 3 x 8 each SL knee extension machine 10# 3 x 10 (left 45# 3 x 10) SL hamstring curl machine 35# 3 x 10 (left 45# 3 x 10)   [facility] Adult PT Treatment:                                                DATE: 09/01/2022 Therapeutic Exercise: Elliptical L5 R5 x 5 min while taking subjective Hex bar deadlift 95# x 8, 115# 3 x 8 TRX SL squat 3 x 10 Lateral band walk with black below knees 3 x 20 down/back SL leg press (cybex): 50# x 8, 60# 3 x 8 (left 140# 3 x 8) SL heel raises on edge of step 3 x 20 SL knee extension machine 10# 3 x 10 (left 45# 3 x 10)  [facility] Adult PT Treatment:                                                DATE: 08/24/2022 Therapeutic Exercise: Elliptical L5 R5 x 5 min while taking subjective Prone quad stretch with strap 3 x 30 sec Barbell back squat 65# 3 x 8 Hex bar deadlift 85# 3 x 8 SLS with 10# KB pass 3 x 20 each Rear foot elevated split squat 3 x 10 each, 5# KB Lateral band walk with black below knees 3 x 20 down/back Runner step-up on 8" box holding 5# each 2 x 8 each Childs Pose at end of session to reduce lumbar tension and pain Modalities: HMP to lumbar x 10 minutes   PATIENT EDUCATION:  Education details: HEP, continue gym based strengthening Person educated: Patient Education method: Explanation, Demonstration, Tactile cues, Verbal cues Education comprehension: verbalized understanding, returned demonstration, verbal cues required, tactile cues required, and needs further education   HOME EXERCISE PROGRAM: Access Code: [email or web address]     ASSESSMENT: CLINICAL  IMPRESSION: Patient tolerated therapy well with no adverse effects. Therapy continues to focus primarily on progressing strength and incorporating more SL strengthening. She demonstrates strength deficit of the right quad primarily and was   encouraged in SL squat and step down progression for knee control and strengthening. She is tolerating increased weight with her strengthening exercises and does require occasional cueing to avoid weight shift or compensation. No changes made to HEP this visit. Patient would benefit from continued skilled PT to progress her mobility and strength in order to reduce pain and maximize functional ability.    OBJECTIVE IMPAIRMENTS Abnormal gait, decreased activity tolerance, decreased balance, difficulty walking, decreased ROM, decreased strength, impaired flexibility, improper body mechanics, and pain.    ACTIVITY LIMITATIONS lifting, bending, standing, squatting, stairs, transfers, and locomotion level   PARTICIPATION LIMITATIONS: meal prep, cleaning, shopping, community activity, and occupation   PERSONAL FACTORS Past/current experiences are also affecting patient's functional outcome.      GOALS: Goals reviewed with patient? Yes   SHORT TERM GOALS: Target date: 07/25/2022    Patient will be I with initial HEP in order to progress with therapy. Baseline: HEP provided at eval 07/27/2022: independent with initial current HEP Goal status: MET   2.  Patient will perform SLR x 10 repetitions without extension lag in order to improve quad control with ambulation Baseline: good quad activation, slight extension lag with SLR, fatigues with repetitions 07/03/2022: patient able to perform SLR x 10 without lag Goal status: MET   3.  Patient will demonstrate normalize gait mechanics without AD in order to improve walking ability Baseline: patient using bilateral crutches and knee immobilizer locked in extension 07/27/2022: patient ambulating without device Goal status:  MET   4.  Patient will demonstrate right knee AROM 5-0-90 deg in order to  Baseline: right knee AROM 5-60 deg 07/27/2022: right knee AROM 5 - 0 - 117 deg Goal status: MET   5. Patient will report pain level </= 2/10 with walking or PT related activities in order to allow progress with therapy and reduce functional limitations.            Baseline: 4/10  07/27/2022: patient denies pain with exercises Goal Status: MET   LONG TERM GOALS: Target date: 10/15/2022   Patient will be I with final HEP to maintain progress from PT. Baseline: HEP provided at eval 07/27/2022: progressing toward final HEP 08/20/2022: progressing Goal status: ONGOING   2.  Patient will report >/= 56/80 on LEFS in order to indicate improved functional ability without progression to running or jumping tasks Baseline: 17/80 07/27/2022: 43/80 08/20/2022: 54/80 Goal status: PARTIALLY MET   3.  Patient will be able to demonstrate a squat without deviation or pain in order to progress strength and improve ability to perform household tasks Baseline: unable at eval 07/27/2022: patient performing wall squats, requires cues for even weight distribution and proper knee alignment 08/20/2022: progressing weighted squats Goal status: PARTIALLY MET   4.  Patient will demonstrate full right knee AROM in order to improve functional mobility and reduce limitations with movement Baseline: knee AROM 5-60 deg at evaluation 07/27/2022: right knee AROM 5 - 0 - 117 deg 08/20/2022: patient demonstrates full AROM Goal status: MET     PLAN: PT FREQUENCY: 1x/week   PT DURATION: 8 weeks   PLANNED INTERVENTIONS: Therapeutic exercises, Therapeutic activity, Neuromuscular re-education, Balance training, Gait training, Patient/Family education, Self Care, Joint mobilization, Joint manipulation, Aquatic Therapy, Dry Needling, Electrical stimulation, Cryotherapy, Moist heat, Taping, Vasopneumatic device, Manual therapy, and Re-evaluation    PLAN FOR NEXT SESSION: Review HEP and progress PRN, progressing quad and hip strengthening, single leg stability   Campbell Brown, PT, DPT, LAT, ATC 09/08/22    10:29 AM Phone: [phone] Fax: [phone]

## 2022-09-08 ENCOUNTER — Encounter: Payer: Self-pay | Admitting: Physical Therapy

## 2022-09-08 ENCOUNTER — Other Ambulatory Visit: Payer: Self-pay

## 2022-09-08 ENCOUNTER — Ambulatory Visit: Payer: 59 | Attending: Orthopaedic Surgery | Admitting: Physical Therapy

## 2022-09-08 DIAGNOSIS — G8929 Other chronic pain: Secondary | ICD-10-CM | POA: Insufficient documentation

## 2022-09-08 DIAGNOSIS — M6281 Muscle weakness (generalized): Secondary | ICD-10-CM | POA: Insufficient documentation

## 2022-09-08 DIAGNOSIS — M25561 Pain in right knee: Secondary | ICD-10-CM | POA: Diagnosis not present

## 2022-09-08 DIAGNOSIS — R2689 Other abnormalities of gait and mobility: Secondary | ICD-10-CM | POA: Insufficient documentation

## 2022-09-15 ENCOUNTER — Encounter: Payer: Self-pay | Admitting: Physical Therapy

## 2022-09-15 ENCOUNTER — Ambulatory Visit: Payer: 59 | Admitting: Physical Therapy

## 2022-09-15 ENCOUNTER — Other Ambulatory Visit: Payer: Self-pay

## 2022-09-15 DIAGNOSIS — G8929 Other chronic pain: Secondary | ICD-10-CM | POA: Diagnosis not present

## 2022-09-15 DIAGNOSIS — R2689 Other abnormalities of gait and mobility: Secondary | ICD-10-CM | POA: Diagnosis not present

## 2022-09-15 DIAGNOSIS — M6281 Muscle weakness (generalized): Secondary | ICD-10-CM | POA: Diagnosis not present

## 2022-09-15 DIAGNOSIS — M25561 Pain in right knee: Secondary | ICD-10-CM | POA: Diagnosis not present

## 2022-09-15 NOTE — Therapy (Signed)
OUTPATIENT PHYSICAL THERAPY TREATMENT NOTE   Patient Name: Cheryl Cole MRN: [id number] DOB:2001/01/02, 21 y.o., female Today's Date: 09/15/2022  PCP: Lacie Draft, MD   REFERRING PROVIDER: Hiram Gash, MD   END OF SESSION:   PT End of Session - 09/15/22 0852     Visit Number 20    Number of Visits 24    Date for PT Re-Evaluation 10/15/22    Authorization Type [employer]    PT Start Time 0820    PT Stop Time 0905    PT Time Calculation (min) 45 min    Activity Tolerance Patient tolerated treatment well    Behavior During Therapy [facility] for tasks assessed/performed                          Past Medical History:  Diagnosis Date   Torn meniscus    Past Surgical History:  Procedure Laterality Date   ANTERIOR CRUCIATE LIGAMENT REPAIR Right 06/25/2022   Procedure: RECONSTRUCTION ANTERIOR CRUCIATE LIGAMENT (ACL);  Surgeon: Hiram Gash, MD;  Location: [place];  Service: Orthopedics;  Laterality: Right;   KNEE ARTHROSCOPY WITH ANTERIOR CRUCIATE LIGAMENT (ACL) REPAIR Left 02/05/2021   Procedure: KNEE ARTHROSCOPY WITH ANTERIOR CRUCIATE LIGAMENT (ACL) REPAIR WITH AUTOGRAFT;  Surgeon: Hiram Gash, MD;  Location: [place];  Service: Orthopedics;  Laterality: Left;   KNEE ARTHROSCOPY WITH LATERAL MENISECTOMY  02/05/2021   Procedure: KNEE ARTHROSCOPY WITH LATERAL MENISECTOMY;  Surgeon: Hiram Gash, MD;  Location: [place];  Service: Orthopedics;;   KNEE ARTHROSCOPY WITH MEDIAL MENISECTOMY Left 02/05/2021   Procedure: KNEE ARTHROSCOPY WITH  MEDIAL MENISCUS REPAIR;  Surgeon: Hiram Gash, MD;  Location: [place];  Service: Orthopedics;  Laterality: Left;   KNEE ARTHROSCOPY WITH MEDIAL MENISECTOMY Right 06/25/2022   Procedure: KNEE ARTHROSCOPY WITH MEDIAL MENISECTOMY;  Surgeon: Hiram Gash, MD;  Location: [place];  Service: Orthopedics;  Laterality: Right;   There are no  problems to display for this patient.   REFERRING DIAG: Right knee arthroscopy with ACL reconstruction with BTB autograft and medical meniscus repair 8/24  THERAPY DIAG:  Chronic pain of right knee  Muscle weakness (generalized)  Other abnormalities of gait and mobility  Rationale for Evaluation and Treatment Rehabilitation  PERTINENT HISTORY: s/p right ACL reconstruction with BTB autograft, medial meniscectomy, lateral meniscus repair 06/25/2022  PRECAUTIONS: Knee   SUBJECTIVE: Patient reports she is doing well. No pain reported this visit and she has been consistent with HEP and gym.  PAIN:  Are you having pain? 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   OBJECTIVE: (objective measures completed at initial evaluation unless otherwise dated) PATIENT SURVEYS:  LEFS: 17/80  07/27/2022: 43/80  08/20/2022: 52/80  EDEMA:  Circumferential: Right: 39 cm, Left: 37 cm    MUSCLE LENGTH: Hamstring, calf, and quad flexibility deficit  LOWER EXTREMITY ROM:   Active ROM Right eval Left eval Right 07/20/2022 Right 07/27/2022 Right 08/03/2022 Right 08/20/2022  Knee flexion 60 135 90 deg 117 123 135  Knee extension 5 lacking 8 hyper  5 hyper 5 hyper 5 hyper     PROM right knee 5-0-90 - 07/07/2022  LOWER EXTREMITY MMT:   Patient with good quad activation, slight extension lag with SLR  07/01/2022: patient  able to perform SLR without lag   MMT Right eval Left eval Right 08/12/2022 Right 09/01/2022 Right 09/08/2022 Right 09/15/2022  Hip flexion 4- 4 4     Hip extension 4- _0 Hip abduction 4- _1 Knee flexion - _2 Knee extension - _3 FUNCTIONAL TESTS:  Not assessed   GAIT: - 07/27/2022 Assistive device utilized: None Level of assistance:  Independent Comments: grossly WFL     TODAY'S TREATMENT: [facility] Adult PT Treatment:                                                DATE: 09/15/2022 Therapeutic Exercise: Elliptical L5 R5 x 5 min while taking subjective Dynamic warm-up: hamstring and quad stretch, mini-forward skip, side shuffle, stationary mini-POGO hop Pilates reformer: DL jump/landing with yellow x 15, yellow+blue 2 x 15 Barbell back squat 45# x 10, 65# x 10, 75# 2 x 10 Forward walking lunges 4 x 5 each Lateral band walk with black at ankles 3 x 30 down/back Forward 12" step-up with 5# bilat 3 x 6 SLS on BOSU 3 x 30 sec each LAQ 7.5# 3 x 12 - focus on fast concentric Prone hamstring curl red power band 3 x 12 - focus on fast concentric   [facility] Adult PT Treatment:                                                DATE: 09/08/2022 Therapeutic Exercise: Elliptical L5 R5 x 5 min while taking subjective Hex bar deadlift 95# x 8, 115# x 8, 125# 3 x 6 Forward 8" step-up with 15# bilat 3 x 10 Lateral band walk with black at mid shin 3 x 20 down/back SL squat to 24" surface 3 x 6 right SL RDL with 10# 3 x 8 each SL knee extension machine 10# 3 x 10 (left 45# 3 x 10) SL hamstring curl machine 35# 3 x 10 (left 45# 3 x 10)  [facility] Adult PT Treatment:                                                DATE: 09/01/2022 Therapeutic Exercise: Elliptical L5 R5 x 5 min while taking subjective Hex bar deadlift 95# x 8, 115# 3 x 8 TRX SL squat 3 x 10 Lateral band walk with black below knees 3 x 20 down/back SL leg press (cybex): 50# x 8, 60# 3 x 8 (left 140# 3 x 8) SL heel raises on edge of step 3 x 20 SL knee extension machine 10# 3 x 10 (left 45# 3 x 10)  PATIENT EDUCATION:  Education details: HEP, continue gym based strengthening Person educated: Patient Education method: Explanation, Demonstration, Tactile cues, Verbal cues Education comprehension: verbalized understanding, returned demonstration, verbal cues required, tactile cues  required, and needs further education   HOME EXERCISE PROGRAM: Access Code: [email or web address]     ASSESSMENT: CLINICAL IMPRESSION: Patient tolerated therapy well with no adverse effects. Therapy continues to focus primarily on  strengthening and did initiate light plyometric exercise with good tolerance. She does require cueing for soft landing and make sure to avoid shift away from the right side. She is progressing well with her strengthening and tolerated increased resistance without any increase in pain. Updated her HEP to progress strengthening and instructed to begin light plyos. Patient would benefit from continued skilled PT to progress her mobility and strength in order to reduce pain and maximize functional ability.    OBJECTIVE IMPAIRMENTS Abnormal gait, decreased activity tolerance, decreased balance, difficulty walking, decreased ROM, decreased strength, impaired flexibility, improper body mechanics, and pain.    ACTIVITY LIMITATIONS lifting, bending, standing, squatting, stairs, transfers, and locomotion level   PARTICIPATION LIMITATIONS: meal prep, cleaning, shopping, community activity, and occupation   PERSONAL FACTORS Past/current experiences are also affecting patient's functional outcome.      GOALS: Goals reviewed with patient? Yes   SHORT TERM GOALS: Target date: 07/25/2022    Patient will be I with initial HEP in order to progress with therapy. Baseline: HEP provided at eval 07/27/2022: independent with initial current HEP Goal status: MET   2.  Patient will perform SLR x 10 repetitions without extension lag in order to improve quad control with ambulation Baseline: good quad activation, slight extension lag with SLR, fatigues with repetitions 07/03/2022: patient able to perform SLR x 10 without lag Goal status: MET   3.  Patient will demonstrate normalize gait mechanics without AD in order to improve walking ability Baseline: patient using bilateral crutches and knee  immobilizer locked in extension 07/27/2022: patient ambulating without device Goal status: MET   4.  Patient will demonstrate right knee AROM 5-0-90 deg in order to  Baseline: right knee AROM 5-60 deg 07/27/2022: right knee AROM 5 - 0 - 117 deg Goal status: MET   5. Patient will report pain level </= 2/10 with walking or PT related activities in order to allow progress with therapy and reduce functional limitations.            Baseline: 4/10  07/27/2022: patient denies pain with exercises Goal Status: MET   LONG TERM GOALS: Target date: 10/15/2022   Patient will be I with final HEP to maintain progress from PT. Baseline: HEP provided at eval 07/27/2022: progressing toward final HEP 08/20/2022: progressing Goal status: ONGOING   2.  Patient will report >/= 56/80 on LEFS in order to indicate improved functional ability without progression to running or jumping tasks Baseline: 17/80 07/27/2022: 43/80 08/20/2022: 54/80 Goal status: PARTIALLY MET   3.  Patient will be able to demonstrate a squat without deviation or pain in order to progress strength and improve ability to perform household tasks Baseline: unable at eval 07/27/2022: patient performing wall squats, requires cues for even weight distribution and proper knee alignment 08/20/2022: progressing weighted squats Goal status: PARTIALLY MET   4.  Patient will demonstrate full right knee AROM in order to improve functional mobility and reduce limitations with movement Baseline: knee AROM 5-60 deg at evaluation 07/27/2022: right knee AROM 5 - 0 - 117 deg 08/20/2022: patient demonstrates full AROM Goal status: MET     PLAN: PT FREQUENCY: 1x/week   PT DURATION: 8 weeks   PLANNED INTERVENTIONS: Therapeutic exercises, Therapeutic activity, Neuromuscular re-education, Balance training, Gait training, Patient/Family education, Self Care, Joint mobilization, Joint manipulation, Aquatic Therapy, Dry Needling, Electrical stimulation,  Cryotherapy, Moist heat, Taping, Vasopneumatic device, Manual therapy, and Re-evaluation   PLAN FOR NEXT SESSION: Review HEP and progress PRN, progressing quad  and hip strengthening, single leg stability   Hilda Blades, PT, DPT, LAT, ATC 09/15/22  9:28 AM Phone: [phone] Fax: [phone]

## 2022-09-15 NOTE — Patient Instructions (Signed)
Access Code: [email or web address] URL: [email or web address] Date: 09/15/2022 Prepared by: Rosana Hoes  Exercises - Long Sitting Hamstring Stretch  - 1 x daily - 3 reps - 30 seconds hold - Prone Quadriceps Stretch with Strap  - 1 x daily - 3 reps - 30 seconds hold - Gastroc Stretch on Wall  - 1 x daily - 3 reps - 30 seconds hold - Single Leg Bridge  - 3 sets - 10 reps - Sidelying Hip Abduction  - 3 sets - 20 reps - Wall Squat  - 2 sets - 8-10 reps - 5 seconds hold - Standing Single Leg Heel Raise  - 3 sets - 20 reps - Standing Terminal Knee Extension with Resistance  - 2 sets - 20 reps - Side Stepping with Resistance at Thighs  - 3 sets - 20 reps - Single Leg Balance with Clock Reach  - 3 sets - 60 seconds hold - Goblet Squat with Kettlebell  - 3 sets - 8-12 reps - Walking Forward Lunge  - 1 x daily - 3 sets - 10 reps - Single Leg Squat with Chair Touch  - 1 x daily - 3 sets - 10 reps

## 2022-09-21 NOTE — Therapy (Signed)
OUTPATIENT PHYSICAL THERAPY TREATMENT NOTE   Patient Name: Cheryl Cole MRN: [id number] DOB:2001/01/15, 21 y.o., female Today's Date: 09/22/2022  PCP: Lacie Draft, MD   REFERRING PROVIDER: Hiram Gash, MD   END OF SESSION:   PT End of Session - 09/22/22 1741     Visit Number 21    Number of Visits 24    Date for PT Re-Evaluation 10/15/22    Authorization Type [employer]    PT Start Time 1610    PT Stop Time 1700    PT Time Calculation (min) 50 min    Activity Tolerance Patient tolerated treatment well    Behavior During Therapy WFL for tasks assessed/performed                           Past Medical History:  Diagnosis Date   Torn meniscus    Past Surgical History:  Procedure Laterality Date   ANTERIOR CRUCIATE LIGAMENT REPAIR Right 06/25/2022   Procedure: RECONSTRUCTION ANTERIOR CRUCIATE LIGAMENT (ACL);  Surgeon: Hiram Gash, MD;  Location: [place];  Service: Orthopedics;  Laterality: Right;   KNEE ARTHROSCOPY WITH ANTERIOR CRUCIATE LIGAMENT (ACL) REPAIR Left 02/05/2021   Procedure: KNEE ARTHROSCOPY WITH ANTERIOR CRUCIATE LIGAMENT (ACL) REPAIR WITH AUTOGRAFT;  Surgeon: Hiram Gash, MD;  Location: [place];  Service: Orthopedics;  Laterality: Left;   KNEE ARTHROSCOPY WITH LATERAL MENISECTOMY  02/05/2021   Procedure: KNEE ARTHROSCOPY WITH LATERAL MENISECTOMY;  Surgeon: Hiram Gash, MD;  Location: [place];  Service: Orthopedics;;   KNEE ARTHROSCOPY WITH MEDIAL MENISECTOMY Left 02/05/2021   Procedure: KNEE ARTHROSCOPY WITH  MEDIAL MENISCUS REPAIR;  Surgeon: Hiram Gash, MD;  Location: [place];  Service: Orthopedics;  Laterality: Left;   KNEE ARTHROSCOPY WITH MEDIAL MENISECTOMY Right 06/25/2022   Procedure: KNEE ARTHROSCOPY WITH MEDIAL MENISECTOMY;  Surgeon: Hiram Gash, MD;  Location: [place];  Service: Orthopedics;  Laterality: Right;   There are no  problems to display for this patient.   REFERRING DIAG: Right knee arthroscopy with ACL reconstruction with BTB autograft and medical meniscus repair 8/24  THERAPY DIAG:  Chronic pain of right knee  Muscle weakness (generalized)  Other abnormalities of gait and mobility  Rationale for Evaluation and Treatment Rehabilitation  PERTINENT HISTORY: s/p right ACL reconstruction with BTB autograft, medial meniscectomy, lateral meniscus repair 06/25/2022  PRECAUTIONS: Knee   SUBJECTIVE: Patient reports she is doing well. No pain reported this visit and she has been consistent with HEP and gym.  PAIN:  Are you having pain? 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   OBJECTIVE: (objective measures completed at initial evaluation unless otherwise dated) PATIENT SURVEYS:  LEFS: 17/80  07/27/2022: 43/80  08/20/2022: 52/80  EDEMA:  Circumferential: Right: 39 cm, Left: 37 cm    MUSCLE LENGTH: Hamstring, calf, and quad flexibility deficit  LOWER EXTREMITY ROM:   Active ROM Right eval Left eval Right 07/20/2022 Right 07/27/2022 Right 08/03/2022 Right 08/20/2022  Knee flexion 60 135 90 deg 117 123 135  Knee extension 5 lacking 8 hyper  5 hyper 5 hyper 5 hyper     PROM right knee 5-0-90 - 07/07/2022  LOWER EXTREMITY MMT:   Patient with good quad activation, slight extension lag with SLR  07/01/2022:  patient able to perform SLR without lag   MMT Right eval Left eval Right 08/12/2022 Right 09/01/2022 Right 09/08/2022 Right 09/15/2022 Right 09/22/2022  Hip flexion 4- 4 4      Hip extension 4- _0 Hip abduction 4- _1 Knee flexion - _2 Knee extension - _3 FUNCTIONAL TESTS:  Not assessed   GAIT: - 07/27/2022 Assistive device utilized: None Level of  assistance: Independent Comments: grossly WFL     TODAY'S TREATMENT: [facility] Adult PT Treatment:                                                DATE: 09/22/2022 Therapeutic Exercise: Elliptical L5 R5 x 5 min while taking subjective SL drop control x 10 each - focus on SL eccentric control SL 4" drop landing x 15 each Stationary, fwd/bwd, lateral POGO hop Forward 4" box jump x 10 Pilates reformer: DL jump/landing with red+red x 10, alternating SL x 20, SL x 10 Forward lunges with 5# bilat 3 x 10 Side plank with hip abduction 3 x 10 SL squat to 24" surface 2 x 10 Bridge with hamstring curl on physioball 3 x 10 SL RDL with cable 10# 2 x 10 Squat on BOSU 2 x 12   [facility] Adult PT Treatment:                                                DATE: 09/15/2022 Therapeutic Exercise: Elliptical L5 R5 x 5 min while taking subjective Dynamic warm-up: hamstring and quad stretch, mini-forward skip, side shuffle, stationary mini-POGO hop Pilates reformer: DL jump/landing with yellow x 15, yellow+blue 2 x 15 Barbell back squat 45# x 10, 65# x 10, 75# 2 x 10 Forward walking lunges 4 x 5 each Lateral band walk with black at ankles 3 x 30 down/back Forward 12" step-up with 5# bilat 3 x 6 SLS on BOSU 3 x 30 sec each LAQ 7.5# 3 x 12 - focus on fast concentric Prone hamstring curl red power band 3 x 12 - focus on fast concentric  [facility] Adult PT Treatment:                                                DATE: 09/08/2022 Therapeutic Exercise: Elliptical L5 R5 x 5 min while taking subjective Hex bar deadlift 95# x 8, 115# x 8, 125# 3 x 6 Forward 8" step-up with 15# bilat 3 x 10 Lateral band walk with black at mid shin 3 x 20 down/back SL squat to 24" surface 3 x 6 right SL RDL with 10# 3 x 8 each SL knee extension machine 10# 3 x 10 (left 45# 3 x 10) SL hamstring curl machine 35# 3 x 10 (left 45# 3 x 10)  PATIENT EDUCATION:  Education details: HEP, continue gym based strengthening and plyometric  progression Person educated: Patient Education method: Explanation, Demonstration, Tactile cues, Verbal cues Education comprehension: verbalized understanding, returned demonstration, verbal cues  required, tactile cues required, and needs further education   HOME EXERCISE PROGRAM: Access Code: [email or web address]     ASSESSMENT: CLINICAL IMPRESSION: Patient tolerated therapy well with no adverse effects. Therapy continues to focus on progressing knee strength and plyometrics this visit. She continues to demonstrate right knee strength deficit and requires cueing for soft knee landing and sinking into a squat with landing. She does seem to be improving and she did report a minimal amount of pain with landing. No changes to HEP but patient encouraged to work on plyometrics as able. Patient would benefit from continued skilled PT to progress her mobility and strength in order to reduce pain and maximize functional ability.    OBJECTIVE IMPAIRMENTS Abnormal gait, decreased activity tolerance, decreased balance, difficulty walking, decreased ROM, decreased strength, impaired flexibility, improper body mechanics, and pain.    ACTIVITY LIMITATIONS lifting, bending, standing, squatting, stairs, transfers, and locomotion level   PARTICIPATION LIMITATIONS: meal prep, cleaning, shopping, community activity, and occupation   PERSONAL FACTORS Past/current experiences are also affecting patient's functional outcome.      GOALS: Goals reviewed with patient? Yes   SHORT TERM GOALS: Target date: 07/25/2022    Patient will be I with initial HEP in order to progress with therapy. Baseline: HEP provided at eval 07/27/2022: independent with initial current HEP Goal status: MET   2.  Patient will perform SLR x 10 repetitions without extension lag in order to improve quad control with ambulation Baseline: good quad activation, slight extension lag with SLR, fatigues with repetitions 07/03/2022: patient able to perform  SLR x 10 without lag Goal status: MET   3.  Patient will demonstrate normalize gait mechanics without AD in order to improve walking ability Baseline: patient using bilateral crutches and knee immobilizer locked in extension 07/27/2022: patient ambulating without device Goal status: MET   4.  Patient will demonstrate right knee AROM 5-0-90 deg in order to  Baseline: right knee AROM 5-60 deg 07/27/2022: right knee AROM 5 - 0 - 117 deg Goal status: MET   5. Patient will report pain level </= 2/10 with walking or PT related activities in order to allow progress with therapy and reduce functional limitations.            Baseline: 4/10  07/27/2022: patient denies pain with exercises Goal Status: MET   LONG TERM GOALS: Target date: 10/15/2022   Patient will be I with final HEP to maintain progress from PT. Baseline: HEP provided at eval 07/27/2022: progressing toward final HEP 08/20/2022: progressing Goal status: ONGOING   2.  Patient will report >/= 56/80 on LEFS in order to indicate improved functional ability without progression to running or jumping tasks Baseline: 17/80 07/27/2022: 43/80 08/20/2022: 54/80 Goal status: PARTIALLY MET   3.  Patient will be able to demonstrate a squat without deviation or pain in order to progress strength and improve ability to perform household tasks Baseline: unable at eval 07/27/2022: patient performing wall squats, requires cues for even weight distribution and proper knee alignment 08/20/2022: progressing weighted squats Goal status: PARTIALLY MET   4.  Patient will demonstrate full right knee AROM in order to improve functional mobility and reduce limitations with movement Baseline: knee AROM 5-60 deg at evaluation 07/27/2022: right knee AROM 5 - 0 - 117 deg 08/20/2022: patient demonstrates full AROM Goal status: MET     PLAN: PT FREQUENCY: 1x/week   PT DURATION: 8 weeks   PLANNED INTERVENTIONS: Therapeutic exercises, Therapeutic  activity, Neuromuscular re-education, Balance training,  Gait training, Patient/Family education, Self Care, Joint mobilization, Joint manipulation, Aquatic Therapy, Dry Needling, Electrical stimulation, Cryotherapy, Moist heat, Taping, Vasopneumatic device, Manual therapy, and Re-evaluation   PLAN FOR NEXT SESSION: Review HEP and progress PRN, progressing quad and hip strengthening, single leg stability   Hilda Blades, PT, DPT, LAT, ATC 09/22/22  5:45 PM Phone: [phone] Fax: [phone]

## 2022-09-22 ENCOUNTER — Other Ambulatory Visit: Payer: Self-pay

## 2022-09-22 ENCOUNTER — Encounter: Payer: Self-pay | Admitting: Physical Therapy

## 2022-09-22 ENCOUNTER — Ambulatory Visit: Payer: 59 | Admitting: Physical Therapy

## 2022-09-22 DIAGNOSIS — R2689 Other abnormalities of gait and mobility: Secondary | ICD-10-CM | POA: Diagnosis not present

## 2022-09-22 DIAGNOSIS — M6281 Muscle weakness (generalized): Secondary | ICD-10-CM | POA: Diagnosis not present

## 2022-09-22 DIAGNOSIS — M25561 Pain in right knee: Secondary | ICD-10-CM | POA: Diagnosis not present

## 2022-09-22 DIAGNOSIS — G8929 Other chronic pain: Secondary | ICD-10-CM | POA: Diagnosis not present

## 2022-09-28 ENCOUNTER — Encounter: Payer: Self-pay | Admitting: Physical Therapy

## 2022-09-28 ENCOUNTER — Other Ambulatory Visit: Payer: Self-pay

## 2022-09-28 ENCOUNTER — Ambulatory Visit: Payer: 59 | Admitting: Physical Therapy

## 2022-09-28 DIAGNOSIS — M25561 Pain in right knee: Secondary | ICD-10-CM | POA: Diagnosis not present

## 2022-09-28 DIAGNOSIS — R2689 Other abnormalities of gait and mobility: Secondary | ICD-10-CM

## 2022-09-28 DIAGNOSIS — M6281 Muscle weakness (generalized): Secondary | ICD-10-CM | POA: Diagnosis not present

## 2022-09-28 DIAGNOSIS — G8929 Other chronic pain: Secondary | ICD-10-CM

## 2022-09-28 NOTE — Therapy (Signed)
OUTPATIENT PHYSICAL THERAPY TREATMENT NOTE   Patient Name: Cheryl Cole MRN: [id number] DOB:[birth year], 21 y.o., female Today's Date: 09/28/2022  PCP: Lacie Draft, MD   REFERRING PROVIDER: Hiram Gash, MD   END OF SESSION:   PT End of Session - 09/28/22 0835     Visit Number 22    Number of Visits 24    Date for PT Re-Evaluation 10/15/22    Authorization Type [employer]    Authorization Time Period - 10/01/2022    Authorization - Visit Number 2    Authorization - Number of Visits 2    PT Start Time 0831    PT Stop Time 0915    PT Time Calculation (min) 44 min    Activity Tolerance Patient tolerated treatment well    Behavior During Therapy [facility] for tasks assessed/performed                            Past Medical History:  Diagnosis Date   Torn meniscus    Past Surgical History:  Procedure Laterality Date   ANTERIOR CRUCIATE LIGAMENT REPAIR Right 06/25/2022   Procedure: RECONSTRUCTION ANTERIOR CRUCIATE LIGAMENT (ACL);  Surgeon: Hiram Gash, MD;  Location: [place];  Service: Orthopedics;  Laterality: Right;   KNEE ARTHROSCOPY WITH ANTERIOR CRUCIATE LIGAMENT (ACL) REPAIR Left 02/05/2021   Procedure: KNEE ARTHROSCOPY WITH ANTERIOR CRUCIATE LIGAMENT (ACL) REPAIR WITH AUTOGRAFT;  Surgeon: Hiram Gash, MD;  Location: [place];  Service: Orthopedics;  Laterality: Left;   KNEE ARTHROSCOPY WITH LATERAL MENISECTOMY  02/05/2021   Procedure: KNEE ARTHROSCOPY WITH LATERAL MENISECTOMY;  Surgeon: Hiram Gash, MD;  Location: [place];  Service: Orthopedics;;   KNEE ARTHROSCOPY WITH MEDIAL MENISECTOMY Left 02/05/2021   Procedure: KNEE ARTHROSCOPY WITH  MEDIAL MENISCUS REPAIR;  Surgeon: Hiram Gash, MD;  Location: [place];  Service: Orthopedics;  Laterality: Left;   KNEE ARTHROSCOPY WITH MEDIAL MENISECTOMY Right 06/25/2022   Procedure: KNEE ARTHROSCOPY WITH MEDIAL MENISECTOMY;  Surgeon:  Hiram Gash, MD;  Location: [place];  Service: Orthopedics;  Laterality: Right;   There are no problems to display for this patient.   REFERRING DIAG: Right knee arthroscopy with ACL reconstruction with BTB autograft and medical meniscus repair 8/24  THERAPY DIAG:  Chronic pain of right knee  Muscle weakness (generalized)  Other abnormalities of gait and mobility  Rationale for Evaluation and Treatment Rehabilitation  PERTINENT HISTORY: s/p right ACL reconstruction with BTB autograft, medial meniscectomy, lateral meniscus repair 06/25/2022  PRECAUTIONS: Knee   SUBJECTIVE: Patient reports she is doing well with no new issues.   PAIN:  Are you having pain? 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   OBJECTIVE: (objective measures completed at initial evaluation unless otherwise dated) PATIENT SURVEYS:  LEFS: 17/80  07/27/2022: 43/80  08/20/2022: 52/80  EDEMA:  Circumferential: Right: 39 cm, Left: 37 cm    MUSCLE LENGTH: Hamstring, calf, and quad flexibility deficit  LOWER EXTREMITY ROM:   Active ROM Right eval Left eval Right 07/20/2022 Right 07/27/2022 Right 08/03/2022 Right 08/20/2022  Knee flexion 60 135 90 deg 117 123 135  Knee extension 5 lacking 8 hyper  5 hyper 5 hyper 5 hyper     PROM right knee 5-0-90 - 07/07/2022  LOWER EXTREMITY MMT:   Patient with good quad activation, slight extension lag with SLR  07/01/2022: patient able to perform SLR without lag   MMT Right eval Left eval Right 09/15/2022 Right 09/22/2022 Right 09/28/2022  Hip flexion 4- 4     Hip extension 4- _0 Hip abduction 4- _1 Knee flexion - _2 Knee extension - _3 FUNCTIONAL TESTS:  Not assessed   GAIT: - 07/27/2022 Assistive device utilized:  None Level of assistance: Independent Comments: grossly WFL     TODAY'S TREATMENT: [facility] Adult PT Treatment:                                                DATE: 09/28/2022 Therapeutic Exercise: Elliptical L5 R5 x 5 min while taking subjective Pre-jog walking SL squat x 5 lengths down/back Forward 8" box jump 2 x 5 Forward 2' broad 2 x 10 SL 4" drop landing 2 x 5 on right Hex bar deadlift 95# x 10, 125# x 6, 135# 3 x 6 Forward lunges with 10# bilat 3 x 10 Lateral band walk against manual green power band x 3 lengths down/back SL balance on Airex with 10# KB pass 3 x 20 each   [facility] Adult PT Treatment:                                                DATE: 09/22/2022 Therapeutic Exercise: Elliptical L5 R5 x 5 min while taking subjective SL drop control x 10 each - focus on SL eccentric control SL 4" drop landing x 15 each Stationary, fwd/bwd, lateral POGO hop Forward 4" box jump x 10 Pilates reformer: DL jump/landing with red+red x 10, alternating SL x 20, SL x 10 Forward lunges with 5# bilat 3 x 10 Side plank with hip abduction 3 x 10 SL squat to 24" surface 2 x 10 Bridge with hamstring curl on physioball 3 x 10 SL RDL with cable 10# 2 x 10 Squat on BOSU 2 x 12  [facility] Adult PT Treatment:                                                DATE: 09/15/2022 Therapeutic Exercise: Elliptical L5 R5 x 5 min while taking subjective Dynamic warm-up: hamstring and quad stretch, mini-forward skip, side shuffle, stationary mini-POGO hop Pilates reformer: DL jump/landing with yellow x 15, yellow+blue 2 x 15 Barbell back squat 45# x 10, 65# x 10, 75# 2 x 10 Forward walking lunges 4 x 5 each Lateral band walk with black at ankles 3 x 30 down/back Forward 12" step-up with 5# bilat 3 x 6 SLS on BOSU 3 x 30 sec each LAQ 7.5# 3 x 12 - focus on fast concentric Prone hamstring curl red power band 3 x 12 - focus on fast concentric  PATIENT EDUCATION:  Education details: HEP, continue gym based  strengthening and plyometric progression Person educated: Patient Education method: Explanation, Demonstration, Tactile cues, Verbal cues Education comprehension: verbalized understanding, returned demonstration, verbal  cues required, tactile cues required, and needs further education   HOME EXERCISE PROGRAM: Access Code: [email or web address]     ASSESSMENT: CLINICAL IMPRESSION: Patient tolerated therapy well with no adverse effects. Therapy focused on continued progression of plyometrics and strengthening with good tolerance. She was able to progress with her DL jumping tasks but does continue to require cueing for eccentric knee control with SL landing. She is progressing well with her strengthening but does fatigue quickly with exercises and requires cueing to avoid weight shift and maintaining depth with squatting. She was encouraged to continue progression of plyometrics prior to initiating any jogging. Patient would benefit from continued skilled PT to progress her mobility and strength in order to reduce pain and maximize functional ability.    OBJECTIVE IMPAIRMENTS Abnormal gait, decreased activity tolerance, decreased balance, difficulty walking, decreased ROM, decreased strength, impaired flexibility, improper body mechanics, and pain.    ACTIVITY LIMITATIONS lifting, bending, standing, squatting, stairs, transfers, and locomotion level   PARTICIPATION LIMITATIONS: meal prep, cleaning, shopping, community activity, and occupation   PERSONAL FACTORS Past/current experiences are also affecting patient's functional outcome.      GOALS: Goals reviewed with patient? Yes   SHORT TERM GOALS: Target date: 07/25/2022    Patient will be I with initial HEP in order to progress with therapy. Baseline: HEP provided at eval 07/27/2022: independent with initial current HEP Goal status: MET   2.  Patient will perform SLR x 10 repetitions without extension lag in order to improve quad control with  ambulation Baseline: good quad activation, slight extension lag with SLR, fatigues with repetitions 07/03/2022: patient able to perform SLR x 10 without lag Goal status: MET   3.  Patient will demonstrate normalize gait mechanics without AD in order to improve walking ability Baseline: patient using bilateral crutches and knee immobilizer locked in extension 07/27/2022: patient ambulating without device Goal status: MET   4.  Patient will demonstrate right knee AROM 5-0-90 deg in order to  Baseline: right knee AROM 5-60 deg 07/27/2022: right knee AROM 5 - 0 - 117 deg Goal status: MET   5. Patient will report pain level </= 2/10 with walking or PT related activities in order to allow progress with therapy and reduce functional limitations.            Baseline: 4/10  07/27/2022: patient denies pain with exercises Goal Status: MET   LONG TERM GOALS: Target date: 10/15/2022   Patient will be I with final HEP to maintain progress from PT. Baseline: HEP provided at eval 07/27/2022: progressing toward final HEP 08/20/2022: progressing 09/28/2022: progressing plyometrics Goal status: ONGOING   2.  Patient will report >/= 56/80 on LEFS in order to indicate improved functional ability without progression to running or jumping tasks Baseline: 17/80 07/27/2022: 43/80 08/20/2022: 54/80 Goal status: PARTIALLY MET   3.  Patient will be able to demonstrate a squat without deviation or pain in order to progress strength and improve ability to perform household tasks Baseline: unable at eval 07/27/2022: patient performing wall squats, requires cues for even weight distribution and proper knee alignment 08/20/2022: progressing weighted squats 09/28/2022: able to perform bodyweight squat without deficit, progressing weighted squats Goal status: PARTIALLY MET   4.  Patient will demonstrate full right knee AROM in order to improve functional mobility and reduce limitations with movement Baseline: knee  AROM 5-60 deg at evaluation 07/27/2022: right knee AROM 5 - 0 - 117 deg 08/20/2022: patient demonstrates full AROM Goal status: MET  PLAN: PT FREQUENCY: 1x/week   PT DURATION: 8 weeks   PLANNED INTERVENTIONS: Therapeutic exercises, Therapeutic activity, Neuromuscular re-education, Balance training, Gait training, Patient/Family education, Self Care, Joint mobilization, Joint manipulation, Aquatic Therapy, Dry Needling, Electrical stimulation, Cryotherapy, Moist heat, Taping, Vasopneumatic device, Manual therapy, and Re-evaluation   PLAN FOR NEXT SESSION: Review HEP and progress PRN, progressing quad and hip strengthening, single leg stability   Hilda Blades, PT, DPT, LAT, ATC 09/28/22  9:18 AM Phone: [phone] Fax: [phone]

## 2022-10-07 ENCOUNTER — Encounter: Payer: Self-pay | Admitting: Physical Therapy

## 2022-10-07 ENCOUNTER — Ambulatory Visit: Payer: 59 | Attending: Orthopaedic Surgery | Admitting: Physical Therapy

## 2022-10-07 ENCOUNTER — Other Ambulatory Visit: Payer: Self-pay

## 2022-10-07 DIAGNOSIS — M6281 Muscle weakness (generalized): Secondary | ICD-10-CM | POA: Insufficient documentation

## 2022-10-07 DIAGNOSIS — G8929 Other chronic pain: Secondary | ICD-10-CM | POA: Diagnosis not present

## 2022-10-07 DIAGNOSIS — R2689 Other abnormalities of gait and mobility: Secondary | ICD-10-CM | POA: Diagnosis not present

## 2022-10-07 DIAGNOSIS — M25561 Pain in right knee: Secondary | ICD-10-CM | POA: Diagnosis not present

## 2022-10-07 NOTE — Therapy (Addendum)
OUTPATIENT PHYSICAL THERAPY TREATMENT NOTE   Patient Name: Cheryl Cole MRN: [id number] DOB:01-Oct-2001, 21 y.o., female Today's Date: 10/07/2022  PCP: Lacie Draft, MD   REFERRING PROVIDER: Hiram Gash, MD   END OF SESSION:   PT End of Session - 10/07/22 0831     Visit Number 23    Number of Visits 24    Date for PT Re-Evaluation 10/15/22    Authorization Type [employer]    PT Start Time 0826    PT Stop Time 0915    PT Time Calculation (min) 49 min    Activity Tolerance Patient tolerated treatment well    Behavior During Therapy [facility] for tasks assessed/performed                             Past Medical History:  Diagnosis Date   Torn meniscus    Past Surgical History:  Procedure Laterality Date   ANTERIOR CRUCIATE LIGAMENT REPAIR Right 06/25/2022   Procedure: RECONSTRUCTION ANTERIOR CRUCIATE LIGAMENT (ACL);  Surgeon: Hiram Gash, MD;  Location: [place];  Service: Orthopedics;  Laterality: Right;   KNEE ARTHROSCOPY WITH ANTERIOR CRUCIATE LIGAMENT (ACL) REPAIR Left 02/05/2021   Procedure: KNEE ARTHROSCOPY WITH ANTERIOR CRUCIATE LIGAMENT (ACL) REPAIR WITH AUTOGRAFT;  Surgeon: Hiram Gash, MD;  Location: [place];  Service: Orthopedics;  Laterality: Left;   KNEE ARTHROSCOPY WITH LATERAL MENISECTOMY  02/05/2021   Procedure: KNEE ARTHROSCOPY WITH LATERAL MENISECTOMY;  Surgeon: Hiram Gash, MD;  Location: [place];  Service: Orthopedics;;   KNEE ARTHROSCOPY WITH MEDIAL MENISECTOMY Left 02/05/2021   Procedure: KNEE ARTHROSCOPY WITH  MEDIAL MENISCUS REPAIR;  Surgeon: Hiram Gash, MD;  Location: [place];  Service: Orthopedics;  Laterality: Left;   KNEE ARTHROSCOPY WITH MEDIAL MENISECTOMY Right 06/25/2022   Procedure: KNEE ARTHROSCOPY WITH MEDIAL MENISECTOMY;  Surgeon: Hiram Gash, MD;  Location: [place];  Service: Orthopedics;  Laterality: Right;   There are no  problems to display for this patient.   REFERRING DIAG: Right knee arthroscopy with ACL reconstruction with BTB autograft and medical meniscus repair 8/24  THERAPY DIAG:  Chronic pain of right knee  Muscle weakness (generalized)  Other abnormalities of gait and mobility  Rationale for Evaluation and Treatment Rehabilitation  PERTINENT HISTORY: s/p right ACL reconstruction with BTB autograft, medial meniscectomy, lateral meniscus repair 06/25/2022  PRECAUTIONS: Knee   SUBJECTIVE: Patient reports she is doing well with no new issues.   PAIN:  Are you having pain? 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   OBJECTIVE: (objective measures completed at initial evaluation unless otherwise dated) PATIENT SURVEYS:  LEFS: 17/80  07/27/2022: 43/80  08/20/2022: 52/80  EDEMA:  Circumferential: Right: 39 cm, Left: 37 cm    MUSCLE LENGTH: Hamstring, calf, and quad flexibility deficit  LOWER EXTREMITY ROM:   Active ROM Right eval Left eval Right 07/20/2022 Right 07/27/2022 Right 08/03/2022 Right 08/20/2022  Knee flexion 60 135 90 deg 117 123 135  Knee extension 5 lacking 8 hyper  5 hyper 5 hyper 5 hyper     PROM right knee 5-0-90 - 07/07/2022  LOWER EXTREMITY MMT:   Patient with good quad activation, slight extension lag with SLR  07/01/2022: patient able to perform SLR without lag  MMT Right eval Left eval Right 09/15/2022 Right 09/22/2022 Right 09/28/2022  Hip flexion 4- 4     Hip extension 4- _0 Hip abduction 4- _1 Knee flexion - _2 Knee extension - _3 HHD Right 10/07/2022 Left 10/07/2022  Knee flexion 45.3, 45.2, 46.9 45.8 avg 60.6, 61, 54.2 58.6 avg  Knee extension 38.8, 42.1, 42.6 41.2 avg 65.3, 73.1, 63 67.1 avg    Quad limb symmetry:    10/07/2022: 61.4% Hamstring limb symmetry:  10/07/2022: 78.2%  Hamstring/qaud ratio:  10/07/2022: 111%  FUNCTIONAL TESTS:  Not assessed   GAIT: - 07/27/2022 Assistive device utilized: None Level of assistance: Independent Comments: grossly WFL     TODAY'S TREATMENT: [facility] Adult PT Treatment:                                                DATE: 10/07/2022 Therapeutic Exercise: Elliptical L5 R5 x 5 min while taking subjective Leg press (cybex): DL 120# 2 x 10, 200# 3 x 8; SL 60# 3 x 8 (120# 3 x 8 on left) Forward alternating hop (pre-jog) x 5 lengths down/back [place] split squat with 10# bilat 3 x 8 each Lateral band walk with black at ankles 3 x 20 down/back Hamstring curl machine DL 65# 2 x 10; SL 25# 2 x 8 (35# on right) LAQ with 7.5# 3 x 12 (10# 3 x 12 on right)   [facility] Adult PT Treatment:                                                DATE: 09/28/2022 Therapeutic Exercise: Elliptical L5 R5 x 5 min while taking subjective Pre-jog walking SL squat x 5 lengths down/back Forward 8" box jump 2 x 5 Forward 2' broad 2 x 10 SL 4" drop landing 2 x 5 on right Hex bar deadlift 95# x 10, 125# x 6, 135# 3 x 6 Forward lunges with 10# bilat 3 x 10 Lateral band walk against manual green power band x 3 lengths down/back SL balance on Airex with 10# KB pass 3 x 20 each  [facility] Adult PT Treatment:                                                DATE: 09/22/2022 Therapeutic Exercise: Elliptical L5 R5 x 5 min while taking subjective SL drop control x 10 each - focus on SL eccentric control SL 4" drop landing x 15 each Stationary, fwd/bwd, lateral POGO hop Forward 4" box jump x 10 Pilates reformer: DL jump/landing with red+red x 10, alternating SL x 20, SL x 10 Forward lunges with 5# bilat 3 x 10 Side plank with hip abduction 3 x 10 SL squat to 24" surface 2 x 10 Bridge with hamstring curl on physioball 3 x 10 SL RDL with cable 10# 2 x 10 Squat on BOSU 2 x 12  PATIENT EDUCATION:   Education details: HEP, HHD and quad/hamstring strength, continue gym based strengthening and plyometric progression Person educated:  Patient Education method: Explanation, Demonstration, Tactile cues, Verbal cues Education comprehension: verbalized understanding, returned demonstration, verbal cues required, tactile cues required, and needs further education   HOME EXERCISE PROGRAM: Access Code: [email or web address]     ASSESSMENT: CLINICAL IMPRESSION: Patient tolerated therapy well with no adverse effects. Assessed quad strength this visit via HHD and this demonstrates limb symmetry approximately 60% indicating significant quad strength deficit on the right. Therapy continues to focus on progression of right knee strengthening and plyometric exercises. She requires cueing for more upright trunk and allowing knee to move forward and sink with landing to avoid stiff landing and ensure quad control. She was instructed on plyometric progression for home and encouraged to continue focusing on quad strengthening. Patient would benefit from continued skilled PT to progress her mobility and strength in order to reduce pain and maximize functional ability.    OBJECTIVE IMPAIRMENTS Abnormal gait, decreased activity tolerance, decreased balance, difficulty walking, decreased ROM, decreased strength, impaired flexibility, improper body mechanics, and pain.    ACTIVITY LIMITATIONS lifting, bending, standing, squatting, stairs, transfers, and locomotion level   PARTICIPATION LIMITATIONS: meal prep, cleaning, shopping, community activity, and occupation   PERSONAL FACTORS Past/current experiences are also affecting patient's functional outcome.      GOALS: Goals reviewed with patient? Yes   SHORT TERM GOALS: Target date: 07/25/2022    Patient will be I with initial HEP in order to progress with therapy. Baseline: HEP provided at eval 07/27/2022: independent with initial current HEP Goal status: MET   2.   Patient will perform SLR x 10 repetitions without extension lag in order to improve quad control with ambulation Baseline: good quad activation, slight extension lag with SLR, fatigues with repetitions 07/03/2022: patient able to perform SLR x 10 without lag Goal status: MET   3.  Patient will demonstrate normalize gait mechanics without AD in order to improve walking ability Baseline: patient using bilateral crutches and knee immobilizer locked in extension 07/27/2022: patient ambulating without device Goal status: MET   4.  Patient will demonstrate right knee AROM 5-0-90 deg in order to  Baseline: right knee AROM 5-60 deg 07/27/2022: right knee AROM 5 - 0 - 117 deg Goal status: MET   5. Patient will report pain level </= 2/10 with walking or PT related activities in order to allow progress with therapy and reduce functional limitations.            Baseline: 4/10  07/27/2022: patient denies pain with exercises Goal Status: MET   LONG TERM GOALS: Target date: 10/15/2022   Patient will be I with final HEP to maintain progress from PT. Baseline: HEP provided at eval 07/27/2022: progressing toward final HEP 08/20/2022: progressing 09/28/2022: progressing plyometrics Goal status: ONGOING   2.  Patient will report >/= 56/80 on LEFS in order to indicate improved functional ability without progression to running or jumping tasks Baseline: 17/80 07/27/2022: 43/80 08/20/2022: 54/80 Goal status: PARTIALLY MET   3.  Patient will be able to demonstrate a squat without deviation or pain in order to progress strength and improve ability to perform household tasks Baseline: unable at eval 07/27/2022: patient performing wall squats, requires cues for even weight distribution and proper knee alignment 08/20/2022: progressing weighted squats 09/28/2022: able to perform bodyweight squat without deficit, progressing weighted squats Goal status: PARTIALLY MET   4.  Patient will demonstrate full right  knee AROM in order to improve functional mobility and reduce limitations with movement Baseline: knee AROM 5-60 deg at evaluation 07/27/2022:  right knee AROM 5 - 0 - 117 deg 08/20/2022: patient demonstrates full AROM Goal status: MET     PLAN: PT FREQUENCY: 1x/week   PT DURATION: 8 weeks   PLANNED INTERVENTIONS: Therapeutic exercises, Therapeutic activity, Neuromuscular re-education, Balance training, Gait training, Patient/Family education, Self Care, Joint mobilization, Joint manipulation, Aquatic Therapy, Dry Needling, Electrical stimulation, Cryotherapy, Moist heat, Taping, Vasopneumatic device, Manual therapy, and Re-evaluation   PLAN FOR NEXT SESSION: Review HEP and progress PRN, progressing quad and hip strengthening, single leg stability   Hilda Blades, PT, DPT, LAT, ATC 10/07/22  9:27 AM Phone: [phone] Fax: [phone]

## 2022-10-12 NOTE — Therapy (Signed)
OUTPATIENT PHYSICAL THERAPY TREATMENT NOTE   Patient Name: Cheryl Cole MRN: [id number] DOB:[birth year], 21 y.o., female Today's Date: 10/13/2022  PCP: Lacie Draft, MD   REFERRING PROVIDER: Hiram Gash, MD   END OF SESSION:   PT End of Session - 10/13/22 0834     Visit Number 24    Number of Visits 32    Date for PT Re-Evaluation 12/08/22    Authorization Type [employer]    PT Start Time 0826    PT Stop Time 0915    PT Time Calculation (min) 49 min    Activity Tolerance Patient tolerated treatment well    Behavior During Therapy [facility] for tasks assessed/performed                  Past Medical History:  Diagnosis Date   Torn meniscus    Past Surgical History:  Procedure Laterality Date   ANTERIOR CRUCIATE LIGAMENT REPAIR Right 06/25/2022   Procedure: RECONSTRUCTION ANTERIOR CRUCIATE LIGAMENT (ACL);  Surgeon: Hiram Gash, MD;  Location: [place];  Service: Orthopedics;  Laterality: Right;   KNEE ARTHROSCOPY WITH ANTERIOR CRUCIATE LIGAMENT (ACL) REPAIR Left 02/05/2021   Procedure: KNEE ARTHROSCOPY WITH ANTERIOR CRUCIATE LIGAMENT (ACL) REPAIR WITH AUTOGRAFT;  Surgeon: Hiram Gash, MD;  Location: [place];  Service: Orthopedics;  Laterality: Left;   KNEE ARTHROSCOPY WITH LATERAL MENISECTOMY  02/05/2021   Procedure: KNEE ARTHROSCOPY WITH LATERAL MENISECTOMY;  Surgeon: Hiram Gash, MD;  Location: [place];  Service: Orthopedics;;   KNEE ARTHROSCOPY WITH MEDIAL MENISECTOMY Left 02/05/2021   Procedure: KNEE ARTHROSCOPY WITH  MEDIAL MENISCUS REPAIR;  Surgeon: Hiram Gash, MD;  Location: [place];  Service: Orthopedics;  Laterality: Left;   KNEE ARTHROSCOPY WITH MEDIAL MENISECTOMY Right 06/25/2022   Procedure: KNEE ARTHROSCOPY WITH MEDIAL MENISECTOMY;  Surgeon: Hiram Gash, MD;  Location: [place];  Service: Orthopedics;  Laterality: Right;   There are no problems to display for  this patient.   REFERRING DIAG: Right knee arthroscopy with ACL reconstruction with BTB autograft and medical meniscus repair 8/24  THERAPY DIAG:  Chronic pain of right knee  Muscle weakness (generalized)  Other abnormalities of gait and mobility  Rationale for Evaluation and Treatment Rehabilitation  PERTINENT HISTORY: s/p right ACL reconstruction with BTB autograft, medial meniscectomy, lateral meniscus repair 06/25/2022  PRECAUTIONS: Knee   SUBJECTIVE: Patient reports she is doing good, she has been strengthening at the gym.   PAIN:  Are you having pain? 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   OBJECTIVE: (objective measures completed at initial evaluation unless otherwise dated) PATIENT SURVEYS:  LEFS: 17/80  07/27/2022: 43/80  08/20/2022: 52/80 10/13/2022: 60/80  LOWER EXTREMITY ROM:   Active ROM Right eval Left eval Right 07/20/2022 Right 07/27/2022 Right 08/03/2022 Right 08/20/2022  Knee flexion 60 135 90 deg 117 123 135  Knee extension 5 lacking 8 hyper  5 hyper 5 hyper 5 hyper     PROM right knee 5-0-90 - 07/07/2022  LOWER EXTREMITY MMT:   Patient with good quad activation, slight extension lag with SLR  07/01/2022: patient able to perform SLR without lag   MMT Right eval Left eval Right 09/15/2022 Right 09/22/2022 Right 09/28/2022  Hip flexion 4- 4     Hip extension 4- 4 4  4  Hip abduction 4- _0 Knee flexion - _1 Knee extension - _2 HHD Right 10/07/2022 Left 10/07/2022  Knee flexion 45.3, 45.2, 46.9 45.8 avg 60.6, 61, 54.2 58.6 avg  Knee extension 38.8, 42.1, 42.6 41.2 avg 65.3, 73.1, 63 67.1 avg    Quad limb symmetry:   10/07/2022: 61.4% Hamstring limb symmetry:  10/07/2022: 78.2%  Hamstring/qaud ratio:  10/07/2022:  111%  FUNCTIONAL TESTS:  Not assessed     TODAY'S TREATMENT: [facility] Adult PT Treatment:                                                DATE: 10/07/2022 Therapeutic Exercise: Elliptical L5 R5 x 5 min while taking subjective Plyometric series: DL forward hop x 2 lengths down/back Alternating forward SL hop x 2 lengths down/back SL forward hop x 3 lengths down/back Forward 8" box jump 2 x 6 SL 4" drop landing 2 x 6 on right Hex bar deadlift 135# 2 x 6, 145# 2 x 6 Leg press (cybex): SL 60# x 8, 70# 2 x 8 (120# x 8, 130# 2 x 8 on left) Reverse lunge with 15# at chest x 8 each Runner 8" step-up with 20# bilat 3 x 5 each LAQ with 8# 3 x 12 on right   [facility] Adult PT Treatment:                                                DATE: 10/07/2022 Therapeutic Exercise: Elliptical L5 R5 x 5 min while taking subjective Leg press (cybex): DL 120# 2 x 10, 200# 3 x 8; SL 60# 3 x 8 (120# 3 x 8 on left) Forward alternating hop (pre-jog) x 5 lengths down/back [place] split squat with 10# bilat 3 x 8 each Lateral band walk with black at ankles 3 x 20 down/back Hamstring curl machine DL 65# 2 x 10; SL 25# 2 x 8 (35# on right) LAQ with 7.5# 3 x 12 (10# 3 x 12 on right)  [facility] Adult PT Treatment:                                                DATE: 09/28/2022 Therapeutic Exercise: Elliptical L5 R5 x 5 min while taking subjective Pre-jog walking SL squat x 5 lengths down/back Forward 8" box jump 2 x 5 Forward 2' broad 2 x 10 SL 4" drop landing 2 x 5 on right Hex bar deadlift 95# x 10, 125# x 6, 135# 3 x 6 Forward lunges with 10# bilat 3 x 10 Lateral band walk against manual green power band x 3 lengths down/back SL balance on Airex with 10# KB pass 3 x 20 each  PATIENT EDUCATION:  Education details: POC update, HEP Person educated: Patient Education method: Explanation, Demonstration, Tactile cues, Verbal cues Education comprehension: verbalized understanding, returned demonstration, verbal cues  required, tactile cues required, and needs further education   HOME EXERCISE PROGRAM: Access Code: [email or web address]     ASSESSMENT: CLINICAL IMPRESSION: Patient tolerated therapy well with  no adverse effects. She reports continued improvement in her functional ability via LEFS this visit. Therapy continues to focus on progress LE strength and control with good tolerance. She does continue to exhibit gross strength deficit of the right LE and exhibits difficulty with jump/landing tasks with a stiffer landing and decreased dynamic stability. She has been consistent with her gym exercises but at this time is not ready to initiate any running progression to persistent weakness and control deficit of the right LE. She was encouraged to continue strengthening and focus on SL strengthening, and gradual progression of SL plyometrics. Patient would benefit from continued skilled PT to progress her mobility and strength in order to reduce pain and maximize functional ability.    OBJECTIVE IMPAIRMENTS Abnormal gait, decreased activity tolerance, decreased balance, difficulty walking, decreased ROM, decreased strength, impaired flexibility, improper body mechanics, and pain.    ACTIVITY LIMITATIONS lifting, bending, standing, squatting, stairs, transfers, and locomotion level   PARTICIPATION LIMITATIONS: meal prep, cleaning, shopping, community activity, and occupation   PERSONAL FACTORS Past/current experiences are also affecting patient's functional outcome.      GOALS: Goals reviewed with patient? Yes   SHORT TERM GOALS: Target date: 07/25/2022    Patient will be I with initial HEP in order to progress with therapy. Baseline: HEP provided at eval 07/27/2022: independent with initial current HEP Goal status: MET   2.  Patient will perform SLR x 10 repetitions without extension lag in order to improve quad control with ambulation Baseline: good quad activation, slight extension lag with SLR, fatigues with  repetitions 07/03/2022: patient able to perform SLR x 10 without lag Goal status: MET   3.  Patient will demonstrate normalize gait mechanics without AD in order to improve walking ability Baseline: patient using bilateral crutches and knee immobilizer locked in extension 07/27/2022: patient ambulating without device Goal status: MET   4.  Patient will demonstrate right knee AROM 5-0-90 deg in order to  Baseline: right knee AROM 5-60 deg 07/27/2022: right knee AROM 5 - 0 - 117 deg Goal status: MET   5. Patient will report pain level </= 2/10 with walking or PT related activities in order to allow progress with therapy and reduce functional limitations.            Baseline: 4/10  07/27/2022: patient denies pain with exercises Goal Status: MET   LONG TERM GOALS: Target date: 12/08/2022    Patient will be I with final HEP to maintain progress from PT. Baseline: HEP provided at eval 07/27/2022: progressing toward final HEP 08/20/2022: progressing 09/28/2022: progressing plyometrics 10/13/2022: progressing Goal status: ONGOING   2.  Patient will report >/= 56/80 on LEFS in order to indicate improved functional ability without progression to running or jumping tasks Baseline: 17/80 07/27/2022: 43/80 08/20/2022: 54/80 10/13/2022: 60/80 Goal status: MET   3.  Patient will be able to demonstrate a squat without deviation or pain in order to progress strength and improve ability to perform household tasks Baseline: unable at eval 07/27/2022: patient performing wall squats, requires cues for even weight distribution and proper knee alignment 08/20/2022: progressing weighted squats 09/28/2022: able to perform bodyweight squat without deficit, progressing weighted squats 10/13/2022: able to perform squat without deviation Goal status: MET   4.  Patient will demonstrate full right knee AROM in order to improve functional mobility and reduce limitations with movement Baseline: knee AROM 5-60 deg  at evaluation 07/27/2022: right knee AROM 5 - 0 - 117 deg 08/20/2022: patient demonstrates full  AROM Goal status: MET  5. Patient will report >/= 75/80 on LEFS in order to indicate return to running and jumping tasks without limitation to return to prior level of function. Baseline: 60/80 Goal status: NEW  6. Patient will demonstrate limb symmetry quad strength within 90% using HHD in order to indicate appropriate strength to return to prior level of activities.  Baseline: 61.4%  Goal status: NEW  7. Patient will demonstrate limb symmetry hop series within 90% in order to indicate strength and control for patient to return to prior level of activities.  Baseline: patient exhibits limitations with hopping on right  Goal status: NEW    PLAN: PT FREQUENCY: 1x/week   PT DURATION: 8 weeks   PLANNED INTERVENTIONS: Therapeutic exercises, Therapeutic activity, Neuromuscular re-education, Balance training, Gait training, Patient/Family education, Self Care, Joint mobilization, Joint manipulation, Aquatic Therapy, Dry Needling, Electrical stimulation, Cryotherapy, Moist heat, Taping, Vasopneumatic device, Manual therapy, and Re-evaluation   PLAN FOR NEXT SESSION: Review HEP and progress PRN, progressing quad and hip strengthening, single leg stability, plyometrics   Hilda Blades, PT, DPT, LAT, ATC 10/13/22  12:06 PM Phone: [phone] Fax: [phone]

## 2022-10-13 ENCOUNTER — Other Ambulatory Visit: Payer: Self-pay

## 2022-10-13 ENCOUNTER — Ambulatory Visit: Payer: 59 | Admitting: Physical Therapy

## 2022-10-13 ENCOUNTER — Encounter: Payer: Self-pay | Admitting: Physical Therapy

## 2022-10-13 DIAGNOSIS — R2689 Other abnormalities of gait and mobility: Secondary | ICD-10-CM

## 2022-10-13 DIAGNOSIS — M6281 Muscle weakness (generalized): Secondary | ICD-10-CM | POA: Diagnosis not present

## 2022-10-13 DIAGNOSIS — G8929 Other chronic pain: Secondary | ICD-10-CM | POA: Diagnosis not present

## 2022-10-13 DIAGNOSIS — M25561 Pain in right knee: Secondary | ICD-10-CM | POA: Diagnosis not present

## 2022-10-19 ENCOUNTER — Other Ambulatory Visit: Payer: Self-pay

## 2022-10-19 ENCOUNTER — Encounter: Payer: Self-pay | Admitting: Physical Therapy

## 2022-10-19 ENCOUNTER — Ambulatory Visit: Payer: 59 | Admitting: Physical Therapy

## 2022-10-19 DIAGNOSIS — G8929 Other chronic pain: Secondary | ICD-10-CM

## 2022-10-19 DIAGNOSIS — R2689 Other abnormalities of gait and mobility: Secondary | ICD-10-CM | POA: Diagnosis not present

## 2022-10-19 DIAGNOSIS — M6281 Muscle weakness (generalized): Secondary | ICD-10-CM | POA: Diagnosis not present

## 2022-10-19 DIAGNOSIS — M25561 Pain in right knee: Secondary | ICD-10-CM | POA: Diagnosis not present

## 2022-10-19 NOTE — Therapy (Signed)
OUTPATIENT PHYSICAL THERAPY TREATMENT NOTE   Patient Name: Cheryl Cole MRN: [id number] DOB:03/19/2001, 21 y.o., female Today's Date: 10/19/2022  PCP: Lacie Draft, MD   REFERRING PROVIDER: Hiram Gash, MD   END OF SESSION:   PT End of Session - 10/19/22 0938     Visit Number 25    Number of Visits 32    Date for PT Re-Evaluation 12/08/22    Authorization Type [employer]    Authorization Time Period 10/13/2022 - 11/01/2022    Authorization - Visit Number 2    Authorization - Number of Visits 4    PT Start Time 0930    PT Stop Time 1015    PT Time Calculation (min) 45 min    Activity Tolerance Patient tolerated treatment well    Behavior During Therapy [facility] for tasks assessed/performed                   Past Medical History:  Diagnosis Date   Torn meniscus    Past Surgical History:  Procedure Laterality Date   ANTERIOR CRUCIATE LIGAMENT REPAIR Right 06/25/2022   Procedure: RECONSTRUCTION ANTERIOR CRUCIATE LIGAMENT (ACL);  Surgeon: Hiram Gash, MD;  Location: [place];  Service: Orthopedics;  Laterality: Right;   KNEE ARTHROSCOPY WITH ANTERIOR CRUCIATE LIGAMENT (ACL) REPAIR Left 02/05/2021   Procedure: KNEE ARTHROSCOPY WITH ANTERIOR CRUCIATE LIGAMENT (ACL) REPAIR WITH AUTOGRAFT;  Surgeon: Hiram Gash, MD;  Location: [place];  Service: Orthopedics;  Laterality: Left;   KNEE ARTHROSCOPY WITH LATERAL MENISECTOMY  02/05/2021   Procedure: KNEE ARTHROSCOPY WITH LATERAL MENISECTOMY;  Surgeon: Hiram Gash, MD;  Location: [place];  Service: Orthopedics;;   KNEE ARTHROSCOPY WITH MEDIAL MENISECTOMY Left 02/05/2021   Procedure: KNEE ARTHROSCOPY WITH  MEDIAL MENISCUS REPAIR;  Surgeon: Hiram Gash, MD;  Location: [place];  Service: Orthopedics;  Laterality: Left;   KNEE ARTHROSCOPY WITH MEDIAL MENISECTOMY Right 06/25/2022   Procedure: KNEE ARTHROSCOPY WITH MEDIAL MENISECTOMY;  Surgeon: Hiram Gash, MD;  Location: [place];  Service: Orthopedics;  Laterality: Right;   There are no problems to display for this patient.   REFERRING DIAG: Right knee arthroscopy with ACL reconstruction with BTB autograft and medical meniscus repair 8/24  THERAPY DIAG:  Chronic pain of right knee  Muscle weakness (generalized)  Other abnormalities of gait and mobility  Rationale for Evaluation and Treatment Rehabilitation  PERTINENT HISTORY: s/p right ACL reconstruction with BTB autograft, medial meniscectomy, lateral meniscus repair 06/25/2022  PRECAUTIONS: Knee   SUBJECTIVE: Patient reports she is doing well with no new issues. She continues to progress herself at the gym.  PAIN:  Are you having pain? 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   OBJECTIVE: (objective measures completed at initial evaluation unless otherwise dated) PATIENT SURVEYS:  LEFS: 17/80  07/27/2022: 43/80  08/20/2022: 52/80 10/13/2022: 60/80  LOWER EXTREMITY ROM:   Active ROM Right eval Left eval Right 07/20/2022 Right 07/27/2022 Right 08/03/2022 Right 08/20/2022  Knee flexion 60 135 90 deg 117 123 135  Knee extension 5 lacking 8 hyper  5 hyper 5 hyper 5 hyper     PROM right knee 5-0-90 - 07/07/2022  LOWER EXTREMITY MMT:   Patient with good quad activation, slight extension lag with SLR  07/01/2022: patient able to  perform SLR without lag   MMT Right eval Left eval Right 09/15/2022 Right 09/22/2022 Right 09/28/2022  Hip flexion 4- 4     Hip extension 4- _0 Hip abduction 4- _1 Knee flexion - _2 Knee extension - _3 HHD Right 10/07/2022 Left 10/07/2022  Knee flexion 45.3, 45.2, 46.9 45.8 avg 60.6, 61, 54.2 58.6 avg  Knee extension 38.8, 42.1, 42.6 41.2 avg  65.3, 73.1, 63 67.1 avg    Quad limb symmetry:   10/07/2022: 61.4% Hamstring limb symmetry:  10/07/2022: 78.2%  Hamstring/qaud ratio:  10/07/2022: 111%  FUNCTIONAL TESTS:  SL hop: patient exhibits stiff landing with decreased depth - 10/19/2022     TODAY'S TREATMENT: [facility] Adult PT Treatment:                                                DATE: 10/07/2022 Therapeutic Exercise: Elliptical L5 R5 x 5 min while taking subjective Plyometric series: DL forward hop x 2 lengths down/back DL squat jump 2 x 10 Alternating forward SL hop x 2 lengths down/back SL forward hop x 3 lengths down/back SL squat jump 2 x 5 Barbell back squat 45# x 10, 65# x 8, 75# 3 x 8 Leg press (cybex): SL 60# x 8, 80# 3 x 8 (120# x 8, 140# 2 x 8 on left) Bridge with hamstring curl on physioball 3 x 10 Modified side plank on knees with hip abduction 3 x 15 each LAQ with 8# 3 x 15 on right Lateral 6" heel tap with yellow TKE 2 x 10   [facility] Adult PT Treatment:                                                DATE: 10/07/2022 Therapeutic Exercise: Elliptical L5 R5 x 5 min while taking subjective Leg press (cybex): DL 120# 2 x 10, 200# 3 x 8; SL 60# 3 x 8 (120# 3 x 8 on left) Forward alternating hop (pre-jog) x 5 lengths down/back [place] split squat with 10# bilat 3 x 8 each Lateral band walk with black at ankles 3 x 20 down/back Hamstring curl machine DL 65# 2 x 10; SL 25# 2 x 8 (35# on right) LAQ with 7.5# 3 x 12 (10# 3 x 12 on right)  [facility] Adult PT Treatment:                                                DATE: 09/28/2022 Therapeutic Exercise: Elliptical L5 R5 x 5 min while taking subjective Pre-jog walking SL squat x 5 lengths down/back Forward 8" box jump 2 x 5 Forward 2' broad 2 x 10 SL 4" drop landing 2 x 5 on right Hex bar deadlift 95# x 10, 125# x 6, 135# 3 x 6 Forward lunges with 10# bilat 3 x 10 Lateral band walk against manual green power band x 3 lengths down/back SL balance on Airex with 10#  KB pass 3 x 20 each  PATIENT EDUCATION:  Education  details: HEP Person educated: Patient Education method: Explanation, Demonstration, Tactile cues, Verbal cues Education comprehension: verbalized understanding, returned demonstration, verbal cues required, tactile cues required, and needs further education   HOME EXERCISE PROGRAM: Access Code: [email or web address]     ASSESSMENT: CLINICAL IMPRESSION: Patient tolerated therapy well with no adverse effects. Therapy continues to focus on progressing strength and plyometrics. She is tolerating increased resistance and weight with exercises. She does continue to have difficulty and limitations with her plyometrics and control on right side. She still does not seem to be ready to initiate a running progression due to strength and control deficit. She was encouraged to continue strengthening and more focus on SL hopping. Patient would benefit from continued skilled PT to progress her mobility and strength in order to reduce pain and maximize functional ability.    OBJECTIVE IMPAIRMENTS Abnormal gait, decreased activity tolerance, decreased balance, difficulty walking, decreased ROM, decreased strength, impaired flexibility, improper body mechanics, and pain.    ACTIVITY LIMITATIONS lifting, bending, standing, squatting, stairs, transfers, and locomotion level   PARTICIPATION LIMITATIONS: meal prep, cleaning, shopping, community activity, and occupation   PERSONAL FACTORS Past/current experiences are also affecting patient's functional outcome.      GOALS: Goals reviewed with patient? Yes   SHORT TERM GOALS: Target date: 07/25/2022    Patient will be I with initial HEP in order to progress with therapy. Baseline: HEP provided at eval 07/27/2022: independent with initial current HEP Goal status: MET   2.  Patient will perform SLR x 10 repetitions without extension lag in order to improve quad control with ambulation Baseline: good quad activation,  slight extension lag with SLR, fatigues with repetitions 07/03/2022: patient able to perform SLR x 10 without lag Goal status: MET   3.  Patient will demonstrate normalize gait mechanics without AD in order to improve walking ability Baseline: patient using bilateral crutches and knee immobilizer locked in extension 07/27/2022: patient ambulating without device Goal status: MET   4.  Patient will demonstrate right knee AROM 5-0-90 deg in order to  Baseline: right knee AROM 5-60 deg 07/27/2022: right knee AROM 5 - 0 - 117 deg Goal status: MET   5. Patient will report pain level </= 2/10 with walking or PT related activities in order to allow progress with therapy and reduce functional limitations.            Baseline: 4/10  07/27/2022: patient denies pain with exercises Goal Status: MET   LONG TERM GOALS: Target date: 12/08/2022    Patient will be I with final HEP to maintain progress from PT. Baseline: HEP provided at eval 07/27/2022: progressing toward final HEP 08/20/2022: progressing 09/28/2022: progressing plyometrics 10/13/2022: progressing Goal status: ONGOING   2.  Patient will report >/= 56/80 on LEFS in order to indicate improved functional ability without progression to running or jumping tasks Baseline: 17/80 07/27/2022: 43/80 08/20/2022: 54/80 10/13/2022: 60/80 Goal status: MET   3.  Patient will be able to demonstrate a squat without deviation or pain in order to progress strength and improve ability to perform household tasks Baseline: unable at eval 07/27/2022: patient performing wall squats, requires cues for even weight distribution and proper knee alignment 08/20/2022: progressing weighted squats 09/28/2022: able to perform bodyweight squat without deficit, progressing weighted squats 10/13/2022: able to perform squat without deviation Goal status: MET   4.  Patient will demonstrate full right knee AROM in order to improve functional mobility and reduce limitations  with movement Baseline: knee AROM 5-60 deg  at evaluation 07/27/2022: right knee AROM 5 - 0 - 117 deg 08/20/2022: patient demonstrates full AROM Goal status: MET  5. Patient will report >/= 75/80 on LEFS in order to indicate return to running and jumping tasks without limitation to return to prior level of function. Baseline: 60/80 Goal status: NEW  6. Patient will demonstrate limb symmetry quad strength within 90% using HHD in order to indicate appropriate strength to return to prior level of activities.  Baseline: 61.4%  Goal status: NEW  7. Patient will demonstrate limb symmetry hop series within 90% in order to indicate strength and control for patient to return to prior level of activities.  Baseline: patient exhibits limitations with hopping on right  Goal status: NEW    PLAN: PT FREQUENCY: 1x/week   PT DURATION: 8 weeks   PLANNED INTERVENTIONS: Therapeutic exercises, Therapeutic activity, Neuromuscular re-education, Balance training, Gait training, Patient/Family education, Self Care, Joint mobilization, Joint manipulation, Aquatic Therapy, Dry Needling, Electrical stimulation, Cryotherapy, Moist heat, Taping, Vasopneumatic device, Manual therapy, and Re-evaluation   PLAN FOR NEXT SESSION: Review HEP and progress PRN, progressing quad and hip strengthening, single leg stability, plyometrics   Hilda Blades, PT, DPT, LAT, ATC 10/19/22  10:26 AM Phone: [phone] Fax: [phone]

## 2022-10-27 ENCOUNTER — Encounter: Payer: Medicaid Other | Admitting: Physical Therapy

## 2022-10-27 NOTE — Therapy (Signed)
OUTPATIENT PHYSICAL THERAPY TREATMENT NOTE   Patient Name: Cheryl Cole MRN: [id number] DOB:01-Jul-2001, 21 y.o., female Today's Date: 10/28/2022  PCP: Lacie Draft, MD   REFERRING PROVIDER: Hiram Gash, MD   END OF SESSION:   PT End of Session - 10/28/22 0833     Visit Number 26    Number of Visits 32    Date for PT Re-Evaluation 12/08/22    Authorization Type [employer]    Authorization Time Period 10/13/2022 - 11/01/2022    Authorization - Visit Number 3    Authorization - Number of Visits 4    PT Start Time 0840    PT Stop Time 0930    PT Time Calculation (min) 50 min    Activity Tolerance Patient tolerated treatment well    Behavior During Therapy [facility] for tasks assessed/performed                    Past Medical History:  Diagnosis Date   Torn meniscus    Past Surgical History:  Procedure Laterality Date   ANTERIOR CRUCIATE LIGAMENT REPAIR Right 06/25/2022   Procedure: RECONSTRUCTION ANTERIOR CRUCIATE LIGAMENT (ACL);  Surgeon: Hiram Gash, MD;  Location: [place];  Service: Orthopedics;  Laterality: Right;   KNEE ARTHROSCOPY WITH ANTERIOR CRUCIATE LIGAMENT (ACL) REPAIR Left 02/05/2021   Procedure: KNEE ARTHROSCOPY WITH ANTERIOR CRUCIATE LIGAMENT (ACL) REPAIR WITH AUTOGRAFT;  Surgeon: Hiram Gash, MD;  Location: [place];  Service: Orthopedics;  Laterality: Left;   KNEE ARTHROSCOPY WITH LATERAL MENISECTOMY  02/05/2021   Procedure: KNEE ARTHROSCOPY WITH LATERAL MENISECTOMY;  Surgeon: Hiram Gash, MD;  Location: [place];  Service: Orthopedics;;   KNEE ARTHROSCOPY WITH MEDIAL MENISECTOMY Left 02/05/2021   Procedure: KNEE ARTHROSCOPY WITH  MEDIAL MENISCUS REPAIR;  Surgeon: Hiram Gash, MD;  Location: [place];  Service: Orthopedics;  Laterality: Left;   KNEE ARTHROSCOPY WITH MEDIAL MENISECTOMY Right 06/25/2022   Procedure: KNEE ARTHROSCOPY WITH MEDIAL MENISECTOMY;  Surgeon: Hiram Gash, MD;  Location: [place];  Service: Orthopedics;  Laterality: Right;   There are no problems to display for this patient.   REFERRING DIAG: Right knee arthroscopy with ACL reconstruction with BTB autograft and medical meniscus repair 8/24  THERAPY DIAG:  Chronic pain of right knee  Muscle weakness (generalized)  Other abnormalities of gait and mobility  Rationale for Evaluation and Treatment Rehabilitation  PERTINENT HISTORY: s/p right ACL reconstruction with BTB autograft, medial meniscectomy, lateral meniscus repair 06/25/2022  PRECAUTIONS: Knee   SUBJECTIVE: Patient reports she is doing well. She did go to the gym for strengthening but has not done any jumping.  PAIN:  Are you having pain? 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   OBJECTIVE: (objective measures completed at initial evaluation unless otherwise dated) PATIENT SURVEYS:  LEFS: 17/80  07/27/2022: 43/80  08/20/2022: 52/80 10/13/2022: 60/80  LOWER EXTREMITY ROM:   Active ROM Right eval Left eval Right 07/20/2022 Right 07/27/2022 Right 08/03/2022 Right 08/20/2022  Knee flexion 60 135 90 deg 117 123 135  Knee extension 5 lacking 8 hyper  5 hyper 5 hyper 5 hyper     PROM right knee 5-0-90 - 07/07/2022  LOWER EXTREMITY MMT:   Patient with good quad activation, slight extension lag with SLR  07/01/2022:  patient able to perform SLR without lag   MMT Right eval Left eval Right 09/15/2022 Right 09/22/2022 Right 09/28/2022  Hip flexion 4- 4     Hip extension 4- _0 Hip abduction 4- _1 Knee flexion - _2 Knee extension - _3 HHD Right 10/07/2022 Left 10/07/2022  Knee flexion 45.3, 45.2, 46.9 45.8 avg 60.6, 61, 54.2 58.6 avg  Knee extension 38.8, 42.1, 42.6 41.2 avg  65.3, 73.1, 63 67.1 avg    Quad limb symmetry:   10/07/2022: 61.4% Hamstring limb symmetry:  10/07/2022: 78.2%  Hamstring/qaud ratio:  10/07/2022: 111%  FUNCTIONAL TESTS:  SL hop: patient exhibits stiff landing with decreased depth - 10/19/2022     TODAY'S TREATMENT: [facility] Adult PT Treatment:                                                DATE: 10/28/2022 Therapeutic Exercise: Elliptical L5 R5 x 5 min while taking subjective Plyometric series: DL squat jump 2 x 10 DL forward hop x 2 lengths down/back Box jump 6" x 10, 8" x 10 Barbell box (20") squat 75# x 8, 85# 3 x 8 Leg press (cybex): SL 80# 3 x 8 (140# 3 x 8 on left) Eccentric knee extension machine 15# 4 x 6 Prone hamstring curl with FM 7# 3 x 10   [facility] Adult PT Treatment:                                                DATE: 10/07/2022 Therapeutic Exercise: Elliptical L5 R5 x 5 min while taking subjective Plyometric series: DL forward hop x 2 lengths down/back DL squat jump 2 x 10 Alternating forward SL hop x 2 lengths down/back SL forward hop x 3 lengths down/back SL squat jump 2 x 5 Barbell back squat 45# x 10, 65# x 8, 75# 3 x 8 Leg press (cybex): SL 60# x 8, 80# 3 x 8 (120# x 8, 140# 2 x 8 on left) Bridge with hamstring curl on physioball 3 x 10 Modified side plank on knees with hip abduction 3 x 15 each LAQ with 8# 3 x 15 on right Lateral 6" heel tap with yellow TKE 2 x 10  [facility] Adult PT Treatment:                                                DATE: 10/07/2022 Therapeutic Exercise: Elliptical L5 R5 x 5 min while taking subjective Leg press (cybex): DL 120# 2 x 10, 200# 3 x 8; SL 60# 3 x 8 (120# 3 x 8 on left) Forward alternating hop (pre-jog) x 5 lengths down/back [place] split squat with 10# bilat 3 x 8 each Lateral band walk with black at ankles 3 x 20 down/back Hamstring curl machine DL 65# 2 x 10; SL 25# 2 x 8 (35# on right) LAQ with 7.5# 3 x 12 (10# 3 x 12 on right)  PATIENT EDUCATION:  Education  details: HEP Person educated: Patient Education method:  Explanation, Demonstration, Tactile cues, Verbal cues Education comprehension: verbalized understanding, returned demonstration, verbal cues required, tactile cues required, and needs further education   HOME EXERCISE PROGRAM: Access Code: [email or web address]     ASSESSMENT: CLINICAL IMPRESSION: Patient tolerated therapy well with no adverse effects. Therapy continues to focus on progressing LE strength and plyometrics. She was able to progress weight with squats and strengthening exercises. She does continue to exhibit slight shift with jump/landings and heavier squats. She is able to improve form with cueing. Her single leg strength and control is still not adequate enough to begin running progression at this time. Patient encouraged to continue strengthening and have more focus on single leg exercises. Patient would benefit from continued skilled PT to progress her mobility and strength in order to reduce pain and maximize functional ability.    OBJECTIVE IMPAIRMENTS Abnormal gait, decreased activity tolerance, decreased balance, difficulty walking, decreased ROM, decreased strength, impaired flexibility, improper body mechanics, and pain.    ACTIVITY LIMITATIONS lifting, bending, standing, squatting, stairs, transfers, and locomotion level   PARTICIPATION LIMITATIONS: meal prep, cleaning, shopping, community activity, and occupation   PERSONAL FACTORS Past/current experiences are also affecting patient's functional outcome.      GOALS: Goals reviewed with patient? Yes   SHORT TERM GOALS: Target date: 07/25/2022    Patient will be I with initial HEP in order to progress with therapy. Baseline: HEP provided at eval 07/27/2022: independent with initial current HEP Goal status: MET   2.  Patient will perform SLR x 10 repetitions without extension lag in order to improve quad control with ambulation Baseline: good quad activation, slight  extension lag with SLR, fatigues with repetitions 07/03/2022: patient able to perform SLR x 10 without lag Goal status: MET   3.  Patient will demonstrate normalize gait mechanics without AD in order to improve walking ability Baseline: patient using bilateral crutches and knee immobilizer locked in extension 07/27/2022: patient ambulating without device Goal status: MET   4.  Patient will demonstrate right knee AROM 5-0-90 deg in order to  Baseline: right knee AROM 5-60 deg 07/27/2022: right knee AROM 5 - 0 - 117 deg Goal status: MET   5. Patient will report pain level </= 2/10 with walking or PT related activities in order to allow progress with therapy and reduce functional limitations.            Baseline: 4/10  07/27/2022: patient denies pain with exercises Goal Status: MET   LONG TERM GOALS: Target date: 12/08/2022    Patient will be I with final HEP to maintain progress from PT. Baseline: HEP provided at eval 07/27/2022: progressing toward final HEP 08/20/2022: progressing 09/28/2022: progressing plyometrics 10/13/2022: progressing Goal status: ONGOING   2.  Patient will report >/= 56/80 on LEFS in order to indicate improved functional ability without progression to running or jumping tasks Baseline: 17/80 07/27/2022: 43/80 08/20/2022: 54/80 10/13/2022: 60/80 Goal status: MET   3.  Patient will be able to demonstrate a squat without deviation or pain in order to progress strength and improve ability to perform household tasks Baseline: unable at eval 07/27/2022: patient performing wall squats, requires cues for even weight distribution and proper knee alignment 08/20/2022: progressing weighted squats 09/28/2022: able to perform bodyweight squat without deficit, progressing weighted squats 10/13/2022: able to perform squat without deviation Goal status: MET   4.  Patient will demonstrate full right knee AROM in order to improve functional mobility and reduce limitations with  movement Baseline: knee AROM 5-60 deg  at evaluation 07/27/2022: right knee AROM 5 - 0 - 117 deg 08/20/2022: patient demonstrates full AROM Goal status: MET  5. Patient will report >/= 75/80 on LEFS in order to indicate return to running and jumping tasks without limitation to return to prior level of function. Baseline: 60/80 Goal status: NEW  6. Patient will demonstrate limb symmetry quad strength within 90% using HHD in order to indicate appropriate strength to return to prior level of activities.  Baseline: 61.4%  Goal status: NEW  7. Patient will demonstrate limb symmetry hop series within 90% in order to indicate strength and control for patient to return to prior level of activities.  Baseline: patient exhibits limitations with hopping on right  Goal status: NEW    PLAN: PT FREQUENCY: 1x/week   PT DURATION: 8 weeks   PLANNED INTERVENTIONS: Therapeutic exercises, Therapeutic activity, Neuromuscular re-education, Balance training, Gait training, Patient/Family education, Self Care, Joint mobilization, Joint manipulation, Aquatic Therapy, Dry Needling, Electrical stimulation, Cryotherapy, Moist heat, Taping, Vasopneumatic device, Manual therapy, and Re-evaluation   PLAN FOR NEXT SESSION: Review HEP and progress PRN, progressing quad and hip strengthening, single leg stability, plyometrics   Hilda Blades, PT, DPT, LAT, ATC 10/28/22  9:31 AM Phone: [phone] Fax: [phone]

## 2022-10-28 ENCOUNTER — Other Ambulatory Visit: Payer: Self-pay

## 2022-10-28 ENCOUNTER — Encounter: Payer: Self-pay | Admitting: Physical Therapy

## 2022-10-28 ENCOUNTER — Ambulatory Visit: Payer: 59 | Admitting: Physical Therapy

## 2022-10-28 DIAGNOSIS — G8929 Other chronic pain: Secondary | ICD-10-CM | POA: Diagnosis not present

## 2022-10-28 DIAGNOSIS — R2689 Other abnormalities of gait and mobility: Secondary | ICD-10-CM

## 2022-10-28 DIAGNOSIS — M6281 Muscle weakness (generalized): Secondary | ICD-10-CM | POA: Diagnosis not present

## 2022-10-28 DIAGNOSIS — M25561 Pain in right knee: Secondary | ICD-10-CM | POA: Diagnosis not present

## 2022-11-09 ENCOUNTER — Ambulatory Visit: Payer: 59 | Attending: Orthopaedic Surgery | Admitting: Physical Therapy

## 2022-11-09 ENCOUNTER — Encounter: Payer: Self-pay | Admitting: Physical Therapy

## 2022-11-09 ENCOUNTER — Other Ambulatory Visit: Payer: Self-pay

## 2022-11-09 DIAGNOSIS — M6281 Muscle weakness (generalized): Secondary | ICD-10-CM | POA: Diagnosis not present

## 2022-11-09 DIAGNOSIS — G8929 Other chronic pain: Secondary | ICD-10-CM | POA: Insufficient documentation

## 2022-11-09 DIAGNOSIS — R2689 Other abnormalities of gait and mobility: Secondary | ICD-10-CM | POA: Diagnosis not present

## 2022-11-09 DIAGNOSIS — M25561 Pain in right knee: Secondary | ICD-10-CM | POA: Diagnosis not present

## 2022-11-09 NOTE — Therapy (Signed)
OUTPATIENT PHYSICAL THERAPY TREATMENT NOTE   Patient Name: Cheryl Cole MRN: [id number] DOB:[birth year], 22 y.o., female Today's Date: 11/09/2022  PCP: Cleatrice Burke, MD   REFERRING PROVIDER: Bjorn Pippin, MD   END OF SESSION:   PT End of Session - 11/09/22 0929     Visit Number 27    Number of Visits 32    Date for PT Re-Evaluation 12/08/22    Authorization Type [employer]    PT Start Time 0930    PT Stop Time 1015    PT Time Calculation (min) 45 min    Activity Tolerance Patient tolerated treatment well    Behavior During Therapy WFL for tasks assessed/performed                     Past Medical History:  Diagnosis Date   Torn meniscus    Past Surgical History:  Procedure Laterality Date   ANTERIOR CRUCIATE LIGAMENT REPAIR Right 06/25/2022   Procedure: RECONSTRUCTION ANTERIOR CRUCIATE LIGAMENT (ACL);  Surgeon: Bjorn Pippin, MD;  Location: [facility];  Service: Orthopedics;  Laterality: Right;   KNEE ARTHROSCOPY WITH ANTERIOR CRUCIATE LIGAMENT (ACL) REPAIR Left 02/05/2021   Procedure: KNEE ARTHROSCOPY WITH ANTERIOR CRUCIATE LIGAMENT (ACL) REPAIR WITH AUTOGRAFT;  Surgeon: Bjorn Pippin, MD;  Location: [facility];  Service: Orthopedics;  Laterality: Left;   KNEE ARTHROSCOPY WITH LATERAL MENISECTOMY  02/05/2021   Procedure: KNEE ARTHROSCOPY WITH LATERAL MENISECTOMY;  Surgeon: Bjorn Pippin, MD;  Location: [facility];  Service: Orthopedics;;   KNEE ARTHROSCOPY WITH MEDIAL MENISECTOMY Left 02/05/2021   Procedure: KNEE ARTHROSCOPY WITH  MEDIAL MENISCUS REPAIR;  Surgeon: Bjorn Pippin, MD;  Location: [facility];  Service: Orthopedics;  Laterality: Left;   KNEE ARTHROSCOPY WITH MEDIAL MENISECTOMY Right 06/25/2022   Procedure: KNEE ARTHROSCOPY WITH MEDIAL MENISECTOMY;  Surgeon: Bjorn Pippin, MD;  Location: [facility];  Service: Orthopedics;  Laterality: Right;   There are no problems to display  for this patient.   REFERRING DIAG: Right knee arthroscopy with ACL reconstruction with BTB autograft and medical meniscus repair 8/24  THERAPY DIAG:  Chronic pain of right knee  Muscle weakness (generalized)  Other abnormalities of gait and mobility  Rationale for Evaluation and Treatment Rehabilitation  PERTINENT HISTORY: s/p right ACL reconstruction with BTB autograft, medial meniscectomy, lateral meniscus repair 06/25/2022  PRECAUTIONS: Knee   SUBJECTIVE: Patient reports she is doing well. She went to the gym at least twice the past 2 weeks, and has been focusing on single leg strengthening and progressing resistance.   PAIN:  Are you having pain? 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   OBJECTIVE: (objective measures completed at initial evaluation unless otherwise dated) PATIENT SURVEYS:  LEFS: 17/80  07/27/2022: 43/80  08/20/2022: 52/80 10/13/2022: 60/80  LOWER EXTREMITY ROM:   Active ROM Right eval Left eval Right 07/20/2022 Right 07/27/2022 Right 08/03/2022 Right 08/20/2022  Knee flexion 60 135 90 deg 117 123 135  Knee extension 5 lacking 8 hyper  5 hyper 5 hyper 5 hyper     PROM right knee 5-0-90 - 07/07/2022  LOWER EXTREMITY MMT:   Patient with good quad activation, slight extension lag with SLR  07/01/2022: patient able to perform SLR without lag   MMT Right eval Left eval Right  09/15/2022 Right 09/22/2022 Right 09/28/2022 Right 11/09/2022  Hip flexion 4- 4      Hip extension 4- 4 4  4    Hip abduction 4- 4 4  4    Knee flexion - 5 4  4 4   Knee extension - 5 4 4 4 4      HHD Right 10/07/2022 Left 10/07/2022  Knee flexion 45.3, 45.2, 46.9 45.8 avg 60.6, 61, 54.2 58.6 avg  Knee extension 38.8, 42.1, 42.6 41.2 avg 65.3, 73.1, 63 67.1 avg    Quad limb  symmetry:   10/07/2022: 61.4% Hamstring limb symmetry:  10/07/2022: 78.2%  Hamstring/qaud ratio:  10/07/2022: 111%  FUNCTIONAL TESTS:  SL hop: patient exhibits stiff landing with decreased depth - 10/19/2022  Jogging: stiff landing on right - 11/09/2022     TODAY'S TREATMENT: [facility] Adult PT Treatment:                                                DATE: 11/09/2022 Therapeutic Exercise: Elliptical L5 R5 x 5 min while taking subjective Treadmill: 3.5 mph x 2 min, 4.5 mph x 1 min, 3.5 mph x 1 min, 4.5 mph x 1 min, 3.5 mph x 1 min Plyometric series: Box jump 8" 2 x 10 SL 8" depth landing 2 x 5 each Barbell box (20") squat 85# x 8, 95# x 8, 105# 2 x 8 [place] spit squat with 15# bilat 3 x 8 Eccentric knee extension machine 15# 3 x 6 Prone hamstring curl with FM 10# 3 x 10 Standing hip abduction FM 7#  3 x 10   [facility] Adult PT Treatment:                                                DATE: 10/28/2022 Therapeutic Exercise: Elliptical L5 R5 x 5 min while taking subjective Plyometric series: DL squat jump 2 x 10 DL forward hop x 2 lengths down/back Box jump 6" x 10, 8" x 10 Barbell box (20") squat 75# x 8, 85# 3 x 8 Leg press (cybex): SL 80# 3 x 8 (140# 3 x 8 on left) Eccentric knee extension machine 15# 4 x 6 Prone hamstring curl with FM 7# 3 x 10  [facility] Adult PT Treatment:                                                DATE: 10/07/2022 Therapeutic Exercise: Elliptical L5 R5 x 5 min while taking subjective Plyometric series: DL forward hop x 2 lengths down/back DL squat jump 2 x 10 Alternating forward SL hop x 2 lengths down/back SL forward hop x 3 lengths down/back SL squat jump 2 x 5 Barbell back squat 45# x 10, 65# x 8, 75# 3 x 8 Leg press (cybex): SL 60# x 8, 80# 3 x 8 (120# x 8, 140# 2 x 8 on left) Bridge with hamstring curl on physioball 3 x 10 Modified side plank on knees with hip abduction 3 x 15 each LAQ with 8# 3 x 15 on right Lateral 6" heel tap with yellow TKE 2 x  10  PATIENT EDUCATION:  Education details: HEP Person educated: Patient Education method: Education officer, environmental, Corporate treasurer cues, Verbal cues Education comprehension: verbalized understanding, returned demonstration, verbal cues required, tactile cues required, and needs further education   HOME EXERCISE PROGRAM: Access Code: [email or web address]     ASSESSMENT: CLINICAL IMPRESSION: Patient tolerated therapy well with no adverse effects. Initiated jogging on the treadmill this visit and patient does demonstrate stiff landing on right with no report of increased pain. Therapy continues to focus primarily on strength progression and plyometrics to improve knee control and force absorption on right. She continues to exhibit strength deficit of the right knee but overall is progressing well. No changes made to HEP this visit. Patient would benefit from continued skilled PT to progress her mobility and strength in order to reduce pain and maximize functional ability.    OBJECTIVE IMPAIRMENTS Abnormal gait, decreased activity tolerance, decreased balance, difficulty walking, decreased ROM, decreased strength, impaired flexibility, improper body mechanics, and pain.    ACTIVITY LIMITATIONS lifting, bending, standing, squatting, stairs, transfers, and locomotion level   PARTICIPATION LIMITATIONS: meal prep, cleaning, shopping, community activity, and occupation   PERSONAL FACTORS Past/current experiences are also affecting patient's functional outcome.      GOALS: Goals reviewed with patient? Yes   SHORT TERM GOALS: Target date: 07/25/2022    Patient will be I with initial HEP in order to progress with therapy. Baseline: HEP provided at eval 07/27/2022: independent with initial current HEP Goal status: MET   2.  Patient will perform SLR x 10 repetitions without extension lag in order to improve quad control with ambulation Baseline: good quad activation, slight extension lag with SLR, fatigues with  repetitions 07/03/2022: patient able to perform SLR x 10 without lag Goal status: MET   3.  Patient will demonstrate normalize gait mechanics without AD in order to improve walking ability Baseline: patient using bilateral crutches and knee immobilizer locked in extension 07/27/2022: patient ambulating without device Goal status: MET   4.  Patient will demonstrate right knee AROM 5-0-90 deg in order to  Baseline: right knee AROM 5-60 deg 07/27/2022: right knee AROM 5 - 0 - 117 deg Goal status: MET   5. Patient will report pain level </= 2/10 with walking or PT related activities in order to allow progress with therapy and reduce functional limitations.            Baseline: 4/10  07/27/2022: patient denies pain with exercises Goal Status: MET   LONG TERM GOALS: Target date: 12/08/2022    Patient will be I with final HEP to maintain progress from PT. Baseline: HEP provided at eval 07/27/2022: progressing toward final HEP 08/20/2022: progressing 09/28/2022: progressing plyometrics 10/13/2022: progressing Goal status: ONGOING   2.  Patient will report >/= 56/80 on LEFS in order to indicate improved functional ability without progression to running or jumping tasks Baseline: 17/80 07/27/2022: 43/80 08/20/2022: 54/80 10/13/2022: 60/80 Goal status: MET   3.  Patient will be able to demonstrate a squat without deviation or pain in order to progress strength and improve ability to perform household tasks Baseline: unable at eval 07/27/2022: patient performing wall squats, requires cues for even weight distribution and proper knee alignment 08/20/2022: progressing weighted squats 09/28/2022: able to perform bodyweight squat without deficit, progressing weighted squats 10/13/2022: able to perform squat without deviation Goal status: MET   4.  Patient will demonstrate full right knee AROM in order to improve functional mobility and reduce limitations with movement Baseline: knee AROM 5-60 deg  at evaluation 07/27/2022: right knee AROM 5 - 0 - 117 deg 08/20/2022: patient demonstrates full AROM Goal status: MET  5. Patient will report >/= 75/80 on LEFS in order to indicate return to running and jumping tasks without limitation to return to prior level of function. Baseline: 60/80 Goal status: NEW  6. Patient will demonstrate limb symmetry quad strength within 90% using HHD in order to indicate appropriate strength to return to prior level of activities.  Baseline: 61.4%  Goal status: NEW  7. Patient will demonstrate limb symmetry hop series within 90% in order to indicate strength and control for patient to return to prior level of activities.  Baseline: patient exhibits limitations with hopping on right  Goal status: NEW    PLAN: PT FREQUENCY: 1x/week   PT DURATION: 8 weeks   PLANNED INTERVENTIONS: Therapeutic exercises, Therapeutic activity, Neuromuscular re-education, Balance training, Gait training, Patient/Family education, Self Care, Joint mobilization, Joint manipulation, Aquatic Therapy, Dry Needling, Electrical stimulation, Cryotherapy, Moist heat, Taping, Vasopneumatic device, Manual therapy, and Re-evaluation   PLAN FOR NEXT SESSION: Review HEP and progress PRN, progressing quad and hip strengthening, single leg stability, plyometrics, jogging   Rosana Hoes, PT, DPT, LAT, ATC 11/09/22  10:17 AM Phone: [phone] Fax: [phone]

## 2022-11-16 ENCOUNTER — Encounter: Payer: Self-pay | Admitting: Physical Therapy

## 2022-11-16 ENCOUNTER — Ambulatory Visit: Payer: 59 | Admitting: Physical Therapy

## 2022-11-16 ENCOUNTER — Other Ambulatory Visit: Payer: Self-pay

## 2022-11-16 DIAGNOSIS — M25561 Pain in right knee: Secondary | ICD-10-CM | POA: Diagnosis not present

## 2022-11-16 DIAGNOSIS — R2689 Other abnormalities of gait and mobility: Secondary | ICD-10-CM

## 2022-11-16 DIAGNOSIS — G8929 Other chronic pain: Secondary | ICD-10-CM | POA: Diagnosis not present

## 2022-11-16 DIAGNOSIS — M6281 Muscle weakness (generalized): Secondary | ICD-10-CM

## 2022-11-16 NOTE — Therapy (Signed)
OUTPATIENT PHYSICAL THERAPY TREATMENT NOTE   Patient Name: Cheryl Cole MRN: [id number] DOB:[birth year], 22 y.o., female Today's Date: 11/16/2022  PCP: Cleatrice Burke, MD   REFERRING PROVIDER: Bjorn Pippin, MD   END OF SESSION:   PT End of Session - 11/16/22 0828     Visit Number 28    Number of Visits 32    Date for PT Re-Evaluation 12/08/22    Authorization Type A[employer] / [employer]    PT Start Time 0800    PT Stop Time 0845    PT Time Calculation (min) 45 min    Activity Tolerance Patient tolerated treatment well    Behavior During Therapy [facility] for tasks assessed/performed                      Past Medical History:  Diagnosis Date   Torn meniscus    Past Surgical History:  Procedure Laterality Date   ANTERIOR CRUCIATE LIGAMENT REPAIR Right 06/25/2022   Procedure: RECONSTRUCTION ANTERIOR CRUCIATE LIGAMENT (ACL);  Surgeon: Bjorn Pippin, MD;  Location: [facility];  Service: Orthopedics;  Laterality: Right;   KNEE ARTHROSCOPY WITH ANTERIOR CRUCIATE LIGAMENT (ACL) REPAIR Left 02/05/2021   Procedure: KNEE ARTHROSCOPY WITH ANTERIOR CRUCIATE LIGAMENT (ACL) REPAIR WITH AUTOGRAFT;  Surgeon: Bjorn Pippin, MD;  Location: [facility];  Service: Orthopedics;  Laterality: Left;   KNEE ARTHROSCOPY WITH LATERAL MENISECTOMY  02/05/2021   Procedure: KNEE ARTHROSCOPY WITH LATERAL MENISECTOMY;  Surgeon: Bjorn Pippin, MD;  Location: [facility];  Service: Orthopedics;;   KNEE ARTHROSCOPY WITH MEDIAL MENISECTOMY Left 02/05/2021   Procedure: KNEE ARTHROSCOPY WITH  MEDIAL MENISCUS REPAIR;  Surgeon: Bjorn Pippin, MD;  Location: [facility];  Service: Orthopedics;  Laterality: Left;   KNEE ARTHROSCOPY WITH MEDIAL MENISECTOMY Right 06/25/2022   Procedure: KNEE ARTHROSCOPY WITH MEDIAL MENISECTOMY;  Surgeon: Bjorn Pippin, MD;  Location: [facility];  Service: Orthopedics;  Laterality: Right;   There are no problems  to display for this patient.   REFERRING DIAG: Right knee arthroscopy with ACL reconstruction with BTB autograft and medical meniscus repair 8/24  THERAPY DIAG:  Chronic pain of right knee  Muscle weakness (generalized)  Other abnormalities of gait and mobility  Rationale for Evaluation and Treatment Rehabilitation  PERTINENT HISTORY: s/p right ACL reconstruction with BTB autograft, medial meniscectomy, lateral meniscus repair 06/25/2022  PRECAUTIONS: Knee   SUBJECTIVE: Patient reports she is doing well. She did a lot of hiking this past weekend with no issues. Also states she did some jogging and it wasn't bad, for short periods of time.  PAIN:  Are you having pain? 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   OBJECTIVE: (objective measures completed at initial evaluation unless otherwise dated) PATIENT SURVEYS:  LEFS: 17/80  07/27/2022: 43/80  08/20/2022: 52/80 10/13/2022: 60/80  LOWER EXTREMITY ROM:   Active ROM Right eval Left eval Right 07/20/2022 Right 07/27/2022 Right 08/03/2022 Right 08/20/2022  Knee flexion 60 135 90 deg 117 123 135  Knee extension 5 lacking 8 hyper  5 hyper 5 hyper 5 hyper     PROM right knee 5-0-90 - 07/07/2022  LOWER EXTREMITY MMT:   Patient with good quad activation, slight extension lag with SLR  07/01/2022: patient able to perform SLR without lag  MMT Right eval Left eval Right 09/15/2022 Right 09/22/2022 Right 09/28/2022 Right 11/09/2022  Hip flexion 4- 4      Hip extension 4- 4 4  4    Hip abduction 4- 4 4  4    Knee flexion - 5 4  4 4   Knee extension - 5 4 4 4 4      HHD Right 10/07/2022 Left 10/07/2022  Knee flexion 45.3, 45.2, 46.9 45.8 avg 60.6, 61, 54.2 58.6 avg  Knee extension 38.8, 42.1, 42.6 41.2 avg 65.3, 73.1, 63 67.1 avg     Quad limb symmetry:   10/07/2022: 61.4% Hamstring limb symmetry:  10/07/2022: 78.2%  Hamstring/qaud ratio:  10/07/2022: 111%  FUNCTIONAL TESTS:  SL hop: patient exhibits stiff landing with decreased depth - 10/19/2022  Jogging: stiff landing on right - 11/09/2022     TODAY'S TREATMENT: [facility] Adult PT Treatment:                                                DATE: 11/16/2022 Therapeutic Exercise: Elliptical L5 R5 x 4 min while taking subjective Treadmill: 3.5 mph x 1 min, 4.5 mph x 1 min, 3.5 mph x 1 min, 4.5 mph x 1 min, 3.5 mph x 1 min Plyometric series: Box jump 8" x 10, 10" 2 x 10 SL 10" depth landing 2 x 5 each Hex bar deadlift 135# 2 x 8, 145# x 8, 155# 2 x 8 Bulgarian spit squat with 10# bilat 3 x 8 Lateral band walk with black at ankles 3 x 2 lengths down/back Knee extension machine SL 10# 2 x 10, eccentric 25# 3 x 5   [facility] Adult PT Treatment:                                                DATE: 11/09/2022 Therapeutic Exercise: Elliptical L5 R5 x 5 min while taking subjective Treadmill: 3.5 mph x 2 min, 4.5 mph x 1 min, 3.5 mph x 1 min, 4.5 mph x 1 min, 3.5 mph x 1 min Plyometric series: Box jump 8" 2 x 10 SL 8" depth landing 2 x 5 each Barbell box (20") squat 85# x 8, 95# x 8, 105# 2 x 8 01/08/2023 spit squat with 15# bilat 3 x 8 Eccentric knee extension machine 15# 3 x 6 Prone hamstring curl with FM 10# 3 x 10 Standing hip abduction FM 7#  3 x 10  [facility] Adult PT Treatment:                                                DATE: 10/28/2022 Therapeutic Exercise: Elliptical L5 R5 x 5 min while taking subjective Plyometric series: DL squat jump 2 x 10 DL forward hop x 2 lengths down/back Box jump 6" x 10, 8" x 10 Barbell box (20") squat 75# x 8, 85# 3 x 8 Leg press (cybex): SL 80# 3 x 8 (140# 3 x 8 on left) Eccentric knee extension machine 15# 4 x 6 Prone hamstring curl with FM 7# 3 x 10  PATIENT EDUCATION:  Education details: HEP Person educated: Patient Education  method: Explanation, Demonstration, Tactile cues, Verbal cues Education comprehension: verbalized understanding, returned demonstration, verbal cues required, tactile cues required, and needs further education   HOME EXERCISE PROGRAM: Access Code: [email or web address]     ASSESSMENT: CLINICAL IMPRESSION: Patient tolerated therapy well with no adverse effects. Therapy continues to focus on progressing LE strength, plyometrics, and jogging. She continues to exhibit deviations with jogging especially once fatigued. She requires cueing for proper landing mechanics on the right and occasional shifting with weight squat pattern. She continues to exhibit strength deficit of the right knee but overall is progressing well with her strengthening and plyometrics. No changes made to HEP this visit. Patient would benefit from continued skilled PT to progress her mobility and strength in order to reduce pain and maximize functional ability.    OBJECTIVE IMPAIRMENTS Abnormal gait, decreased activity tolerance, decreased balance, difficulty walking, decreased ROM, decreased strength, impaired flexibility, improper body mechanics, and pain.    ACTIVITY LIMITATIONS lifting, bending, standing, squatting, stairs, transfers, and locomotion level   PARTICIPATION LIMITATIONS: meal prep, cleaning, shopping, community activity, and occupation   PERSONAL FACTORS Past/current experiences are also affecting patient's functional outcome.      GOALS: Goals reviewed with patient? Yes   SHORT TERM GOALS: Target date: 07/25/2022    Patient will be I with initial HEP in order to progress with therapy. Baseline: HEP provided at eval 07/27/2022: independent with initial current HEP Goal status: MET   2.  Patient will perform SLR x 10 repetitions without extension lag in order to improve quad control with ambulation Baseline: good quad activation, slight extension lag with SLR, fatigues with repetitions 07/03/2022: patient able to  perform SLR x 10 without lag Goal status: MET   3.  Patient will demonstrate normalize gait mechanics without AD in order to improve walking ability Baseline: patient using bilateral crutches and knee immobilizer locked in extension 07/27/2022: patient ambulating without device Goal status: MET   4.  Patient will demonstrate right knee AROM 5-0-90 deg in order to  Baseline: right knee AROM 5-60 deg 07/27/2022: right knee AROM 5 - 0 - 117 deg Goal status: MET   5. Patient will report pain level </= 2/10 with walking or PT related activities in order to allow progress with therapy and reduce functional limitations.            Baseline: 4/10  07/27/2022: patient denies pain with exercises Goal Status: MET   LONG TERM GOALS: Target date: 12/08/2022    Patient will be I with final HEP to maintain progress from PT. Baseline: HEP provided at eval 07/27/2022: progressing toward final HEP 08/20/2022: progressing 09/28/2022: progressing plyometrics 10/13/2022: progressing Goal status: ONGOING   2.  Patient will report >/= 56/80 on LEFS in order to indicate improved functional ability without progression to running or jumping tasks Baseline: 17/80 07/27/2022: 43/80 08/20/2022: 54/80 10/13/2022: 60/80 Goal status: MET   3.  Patient will be able to demonstrate a squat without deviation or pain in order to progress strength and improve ability to perform household tasks Baseline: unable at eval 07/27/2022: patient performing wall squats, requires cues for even weight distribution and proper knee alignment 08/20/2022: progressing weighted squats 09/28/2022: able to perform bodyweight squat without deficit, progressing weighted squats 10/13/2022: able to perform squat without deviation Goal status: MET   4.  Patient will demonstrate full right knee AROM in order to improve functional mobility and reduce limitations with movement Baseline: knee AROM 5-60 deg at evaluation 07/27/2022: right knee  AROM  5 - 0 - 117 deg 08/20/2022: patient demonstrates full AROM Goal status: MET  5. Patient will report >/= 75/80 on LEFS in order to indicate return to running and jumping tasks without limitation to return to prior level of function. Baseline: 60/80 Goal status: NEW  6. Patient will demonstrate limb symmetry quad strength within 90% using HHD in order to indicate appropriate strength to return to prior level of activities.  Baseline: 61.4%  Goal status: NEW  7. Patient will demonstrate limb symmetry hop series within 90% in order to indicate strength and control for patient to return to prior level of activities.  Baseline: patient exhibits limitations with hopping on right  Goal status: NEW    PLAN: PT FREQUENCY: 1x/week   PT DURATION: 8 weeks   PLANNED INTERVENTIONS: Therapeutic exercises, Therapeutic activity, Neuromuscular re-education, Balance training, Gait training, Patient/Family education, Self Care, Joint mobilization, Joint manipulation, Aquatic Therapy, Dry Needling, Electrical stimulation, Cryotherapy, Moist heat, Taping, Vasopneumatic device, Manual therapy, and Re-evaluation   PLAN FOR NEXT SESSION: Review HEP and progress PRN, progressing quad and hip strengthening, single leg stability, plyometrics, jogging   Hilda Blades, PT, DPT, LAT, ATC 11/16/22  8:53 AM Phone: [phone] Fax: [phone]

## 2022-11-19 NOTE — Therapy (Signed)
OUTPATIENT PHYSICAL THERAPY TREATMENT NOTE   Patient Name: Cheryl Cole MRN: [id number] DOB:2001/04/20, 22 y.o., female Today's Date: 11/23/2022  PCP: Lacie Draft, MD   REFERRING PROVIDER: Hiram Gash, MD   END OF SESSION:   PT End of Session - 11/23/22 0843     Visit Number 29    Number of Visits 32    Date for PT Re-Evaluation 12/08/22    Authorization Type [employer] / [employer]    Authorization Time Period 11/09/2022 - 11/30/2022    Authorization - Visit Number 3    Authorization - Number of Visits 4    PT Start Time 0845    PT Stop Time 0930    PT Time Calculation (min) 45 min    Activity Tolerance Patient tolerated treatment well    Behavior During Therapy [facility] for tasks assessed/performed                       Past Medical History:  Diagnosis Date   Torn meniscus    Past Surgical History:  Procedure Laterality Date   ANTERIOR CRUCIATE LIGAMENT REPAIR Right 06/25/2022   Procedure: RECONSTRUCTION ANTERIOR CRUCIATE LIGAMENT (ACL);  Surgeon: Hiram Gash, MD;  Location: [place];  Service: Orthopedics;  Laterality: Right;   KNEE ARTHROSCOPY WITH ANTERIOR CRUCIATE LIGAMENT (ACL) REPAIR Left 02/05/2021   Procedure: KNEE ARTHROSCOPY WITH ANTERIOR CRUCIATE LIGAMENT (ACL) REPAIR WITH AUTOGRAFT;  Surgeon: Hiram Gash, MD;  Location: [place];  Service: Orthopedics;  Laterality: Left;   KNEE ARTHROSCOPY WITH LATERAL MENISECTOMY  02/05/2021   Procedure: KNEE ARTHROSCOPY WITH LATERAL MENISECTOMY;  Surgeon: Hiram Gash, MD;  Location: [place];  Service: Orthopedics;;   KNEE ARTHROSCOPY WITH MEDIAL MENISECTOMY Left 02/05/2021   Procedure: KNEE ARTHROSCOPY WITH  MEDIAL MENISCUS REPAIR;  Surgeon: Hiram Gash, MD;  Location: [place];  Service: Orthopedics;  Laterality: Left;   KNEE ARTHROSCOPY WITH MEDIAL MENISECTOMY Right 06/25/2022   Procedure: KNEE ARTHROSCOPY WITH MEDIAL MENISECTOMY;   Surgeon: Hiram Gash, MD;  Location: [place];  Service: Orthopedics;  Laterality: Right;   There are no problems to display for this patient.   REFERRING DIAG: Right knee arthroscopy with ACL reconstruction with BTB autograft and medical meniscus repair 8/24  THERAPY DIAG:  Chronic pain of right knee  Muscle weakness (generalized)  Rationale for Evaluation and Treatment Rehabilitation  PERTINENT HISTORY: s/p right ACL reconstruction with BTB autograft, medial meniscectomy, lateral meniscus repair 06/25/2022  PRECAUTIONS: Knee   SUBJECTIVE: Patient reports she is doing well. She was doing some jogging outside and she fell, denies any injury to the right knee.  PAIN:  Are you having pain? 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   OBJECTIVE: (objective measures completed at initial evaluation unless otherwise dated) PATIENT SURVEYS:  LEFS: 17/80  07/27/2022: 43/80  08/20/2022: 52/80 10/13/2022: 60/80  LOWER EXTREMITY ROM:   Active ROM Right eval Left eval Right 07/20/2022 Right 07/27/2022 Right 08/03/2022 Right 08/20/2022  Knee flexion 60 135 90 deg 117 123 135  Knee extension 5 lacking 8 hyper  5 hyper 5 hyper 5 hyper     PROM right knee 5-0-90 - 07/07/2022  LOWER EXTREMITY MMT:   Patient with good quad activation, slight extension lag with SLR  07/01/2022:  patient able to perform SLR without lag   MMT Right eval Left eval Right 09/15/2022 Right 09/22/2022 Right 09/28/2022 Right 11/09/2022  Hip flexion 4- 4      Hip extension 4- 4 4  4    Hip abduction 4- 4 4  4    Knee flexion - 5 4  4 4   Knee extension - 5 4 4 4 4      HHD Right 10/07/2022 Left 10/07/2022  Knee flexion 45.3, 45.2, 46.9 45.8 avg 60.6, 61, 54.2 58.6 avg  Knee extension 38.8, 42.1,  42.6 41.2 avg 65.3, 73.1, 63 67.1 avg    Quad limb symmetry:   10/07/2022: 61.4% Hamstring limb symmetry:  10/07/2022: 78.2%  Hamstring/qaud ratio:  10/07/2022: 111%  FUNCTIONAL TESTS:  SL hop: patient exhibits stiff landing with decreased depth - 10/19/2022  Jogging: stiff landing on right - 11/09/2022     TODAY'S TREATMENT: [facility] Adult PT Treatment:                                                DATE: 11/23/2022 Therapeutic Exercise: Treadmill: 3.5 mph x 2 min, 4.5 mph x 1 min, 3.5 mph x 1 min, 4.5 mph x 1 min, 3.5 mph x 1 min Barbell box (20") squat 95# 2 x 8, 115# 2 x 8, 125# 2 x 8 SL leg press (cybex) 60# x 8, 80# 3 x 8, 90# x 8, 100# x 8 Forward 10" step-up with 15# bilat 3 x 10 each SL stance on Airex with 15# KB pass 3 x 20 each Knee extension machine SL 15# 3 x 6, 20# 2 x 6 Knee flexion machine SL 35# 3 x 8 each   [facility] Adult PT Treatment:                                                DATE: 11/16/2022 Therapeutic Exercise: Elliptical L5 R5 x 4 min while taking subjective Treadmill: 3.5 mph x 1 min, 4.5 mph x 1 min, 3.5 mph x 1 min, 4.5 mph x 1 min, 3.5 mph x 1 min Plyometric series: Box jump 8" x 10, 10" 2 x 10 SL 10" depth landing 2 x 5 each Hex bar deadlift 135# 2 x 8, 145# x 8, 155# 2 x 8 [place] spit squat with 10# bilat 3 x 8 Lateral band walk with black at ankles 3 x 2 lengths down/back Knee extension machine SL 10# 2 x 10, eccentric 25# 3 x 5  [facility] Adult PT Treatment:                                                DATE: 11/09/2022 Therapeutic Exercise: Elliptical L5 R5 x 5 min while taking subjective Treadmill: 3.5 mph x 2 min, 4.5 mph x 1 min, 3.5 mph x 1 min, 4.5 mph x 1 min, 3.5 mph x 1 min Plyometric series: Box jump 8" 2 x 10 SL 8" depth landing 2 x 5 each Barbell box (20") squat 85# x 8, 95# x 8, 105# 2 x 8 Bulgarian spit squat with 15# bilat 3 x 8  Eccentric knee extension machine 15# 3 x 6 Prone hamstring curl with FM 10# 3 x 10 Standing hip  abduction FM 7#  3 x 10  PATIENT EDUCATION:  Education details: HEP Person educated: Patient Education method: Explanation, Demonstration, Tactile cues, Verbal cues Education comprehension: verbalized understanding, returned demonstration, verbal cues required, tactile cues required, and needs further education   HOME EXERCISE PROGRAM: Access Code: [email or web address]     ASSESSMENT: CLINICAL IMPRESSION: Patient tolerated therapy well with no adverse effects. Therapy continued with jogging on treadmill and then focus on progressing of strengthening with good tolerance. She is progressing well with her strengthening and able to increase weight with all exercises this visit. Occasional cueing to avoid weight shifting with squats. No changes made to HEP this visit. Patient would benefit from continued skilled PT to progress her mobility and strength in order to reduce pain and maximize functional ability.    OBJECTIVE IMPAIRMENTS Abnormal gait, decreased activity tolerance, decreased balance, difficulty walking, decreased ROM, decreased strength, impaired flexibility, improper body mechanics, and pain.    ACTIVITY LIMITATIONS lifting, bending, standing, squatting, stairs, transfers, and locomotion level   PARTICIPATION LIMITATIONS: meal prep, cleaning, shopping, community activity, and occupation   PERSONAL FACTORS Past/current experiences are also affecting patient's functional outcome.      GOALS: Goals reviewed with patient? Yes   SHORT TERM GOALS: Target date: 07/25/2022    Patient will be I with initial HEP in order to progress with therapy. Baseline: HEP provided at eval 07/27/2022: independent with initial current HEP Goal status: MET   2.  Patient will perform SLR x 10 repetitions without extension lag in order to improve quad control with ambulation Baseline: good quad activation, slight extension lag with SLR, fatigues with repetitions 07/03/2022: patient able to perform SLR x 10  without lag Goal status: MET   3.  Patient will demonstrate normalize gait mechanics without AD in order to improve walking ability Baseline: patient using bilateral crutches and knee immobilizer locked in extension 07/27/2022: patient ambulating without device Goal status: MET   4.  Patient will demonstrate right knee AROM 5-0-90 deg in order to  Baseline: right knee AROM 5-60 deg 07/27/2022: right knee AROM 5 - 0 - 117 deg Goal status: MET   5. Patient will report pain level </= 2/10 with walking or PT related activities in order to allow progress with therapy and reduce functional limitations.            Baseline: 4/10  07/27/2022: patient denies pain with exercises Goal Status: MET   LONG TERM GOALS: Target date: 12/08/2022    Patient will be I with final HEP to maintain progress from PT. Baseline: HEP provided at eval 07/27/2022: progressing toward final HEP 08/20/2022: progressing 09/28/2022: progressing plyometrics 10/13/2022: progressing Goal status: ONGOING   2.  Patient will report >/= 56/80 on LEFS in order to indicate improved functional ability without progression to running or jumping tasks Baseline: 17/80 07/27/2022: 43/80 08/20/2022: 54/80 10/13/2022: 60/80 Goal status: MET   3.  Patient will be able to demonstrate a squat without deviation or pain in order to progress strength and improve ability to perform household tasks Baseline: unable at eval 07/27/2022: patient performing wall squats, requires cues for even weight distribution and proper knee alignment 08/20/2022: progressing weighted squats 09/28/2022: able to perform bodyweight squat without deficit, progressing weighted squats 10/13/2022: able to perform squat without deviation Goal status: MET   4.  Patient will demonstrate full right knee AROM in order  to improve functional mobility and reduce limitations with movement Baseline: knee AROM 5-60 deg at evaluation 07/27/2022: right knee AROM 5 - 0 - 117  deg 08/20/2022: patient demonstrates full AROM Goal status: MET  5. Patient will report >/= 75/80 on LEFS in order to indicate return to running and jumping tasks without limitation to return to prior level of function. Baseline: 60/80 Goal status: NEW  6. Patient will demonstrate limb symmetry quad strength within 90% using HHD in order to indicate appropriate strength to return to prior level of activities.  Baseline: 61.4%  Goal status: NEW  7. Patient will demonstrate limb symmetry hop series within 90% in order to indicate strength and control for patient to return to prior level of activities.  Baseline: patient exhibits limitations with hopping on right  Goal status: NEW    PLAN: PT FREQUENCY: 1x/week   PT DURATION: 8 weeks   PLANNED INTERVENTIONS: Therapeutic exercises, Therapeutic activity, Neuromuscular re-education, Balance training, Gait training, Patient/Family education, Self Care, Joint mobilization, Joint manipulation, Aquatic Therapy, Dry Needling, Electrical stimulation, Cryotherapy, Moist heat, Taping, Vasopneumatic device, Manual therapy, and Re-evaluation   PLAN FOR NEXT SESSION: Review HEP and progress PRN, progressing quad and hip strengthening, single leg stability, plyometrics, jogging   Rosana Hoes, PT, DPT, LAT, ATC 11/23/22  9:40 AM Phone: [phone] Fax: [phone]

## 2022-11-23 ENCOUNTER — Encounter: Payer: Self-pay | Admitting: Physical Therapy

## 2022-11-23 ENCOUNTER — Other Ambulatory Visit: Payer: Self-pay

## 2022-11-23 ENCOUNTER — Ambulatory Visit: Payer: 59 | Admitting: Physical Therapy

## 2022-11-23 DIAGNOSIS — M6281 Muscle weakness (generalized): Secondary | ICD-10-CM

## 2022-11-23 DIAGNOSIS — G8929 Other chronic pain: Secondary | ICD-10-CM | POA: Diagnosis not present

## 2022-11-23 DIAGNOSIS — M25561 Pain in right knee: Secondary | ICD-10-CM | POA: Diagnosis not present

## 2022-11-23 DIAGNOSIS — R2689 Other abnormalities of gait and mobility: Secondary | ICD-10-CM | POA: Diagnosis not present

## 2022-11-30 ENCOUNTER — Ambulatory Visit: Payer: 59 | Admitting: Physical Therapy

## 2022-11-30 ENCOUNTER — Encounter: Payer: Self-pay | Admitting: Physical Therapy

## 2022-11-30 ENCOUNTER — Other Ambulatory Visit: Payer: Self-pay

## 2022-11-30 DIAGNOSIS — G8929 Other chronic pain: Secondary | ICD-10-CM

## 2022-11-30 DIAGNOSIS — M6281 Muscle weakness (generalized): Secondary | ICD-10-CM

## 2022-11-30 DIAGNOSIS — M25561 Pain in right knee: Secondary | ICD-10-CM | POA: Diagnosis not present

## 2022-11-30 DIAGNOSIS — R2689 Other abnormalities of gait and mobility: Secondary | ICD-10-CM | POA: Diagnosis not present

## 2022-11-30 NOTE — Therapy (Signed)
OUTPATIENT PHYSICAL THERAPY TREATMENT NOTE   Patient Name: Cheryl Cole MRN: [id number] DOB:August 01, 2001, 22 y.o., female Today's Date: 11/30/2022  PCP: Cleatrice Burke, MD   REFERRING PROVIDER: Bjorn Pippin, MD   END OF SESSION:   PT End of Session - 11/30/22 0857     Visit Number 30    Number of Visits 32    Date for PT Re-Evaluation 12/08/22    Authorization Type A[employer] / [employer]    Authorization Time Period 11/09/2022 - 11/30/2022    Authorization - Visit Number 4    Authorization - Number of Visits 4    PT Start Time 0846    PT Stop Time 0930    PT Time Calculation (min) 44 min    Activity Tolerance Patient tolerated treatment well    Behavior During Therapy [facility] for tasks assessed/performed                        Past Medical History:  Diagnosis Date   Torn meniscus    Past Surgical History:  Procedure Laterality Date   ANTERIOR CRUCIATE LIGAMENT REPAIR Right 06/25/2022   Procedure: RECONSTRUCTION ANTERIOR CRUCIATE LIGAMENT (ACL);  Surgeon: Bjorn Pippin, MD;  Location: [facility];  Service: Orthopedics;  Laterality: Right;   KNEE ARTHROSCOPY WITH ANTERIOR CRUCIATE LIGAMENT (ACL) REPAIR Left 02/05/2021   Procedure: KNEE ARTHROSCOPY WITH ANTERIOR CRUCIATE LIGAMENT (ACL) REPAIR WITH AUTOGRAFT;  Surgeon: Bjorn Pippin, MD;  Location: [facility];  Service: Orthopedics;  Laterality: Left;   KNEE ARTHROSCOPY WITH LATERAL MENISECTOMY  02/05/2021   Procedure: KNEE ARTHROSCOPY WITH LATERAL MENISECTOMY;  Surgeon: Bjorn Pippin, MD;  Location: [facility];  Service: Orthopedics;;   KNEE ARTHROSCOPY WITH MEDIAL MENISECTOMY Left 02/05/2021   Procedure: KNEE ARTHROSCOPY WITH  MEDIAL MENISCUS REPAIR;  Surgeon: Bjorn Pippin, MD;  Location: [facility];  Service: Orthopedics;  Laterality: Left;   KNEE ARTHROSCOPY WITH MEDIAL MENISECTOMY Right 06/25/2022   Procedure: KNEE ARTHROSCOPY WITH MEDIAL MENISECTOMY;   Surgeon: Bjorn Pippin, MD;  Location: [facility];  Service: Orthopedics;  Laterality: Right;   There are no problems to display for this patient.   REFERRING DIAG: Right knee arthroscopy with ACL reconstruction with BTB autograft and medical meniscus repair 8/24  THERAPY DIAG:  Chronic pain of right knee  Muscle weakness (generalized)  Other abnormalities of gait and mobility  Rationale for Evaluation and Treatment Rehabilitation  PERTINENT HISTORY: s/p right ACL reconstruction with BTB autograft, medial meniscectomy, lateral meniscus repair 06/25/2022  PRECAUTIONS: Knee   SUBJECTIVE: Patient reports she is doing well with no new issues. Denies any pain. She states she continues to progress her strengthening at the gym and feels she is getting stronger.  PAIN:  Are you having pain? 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   OBJECTIVE: (objective measures completed at initial evaluation unless otherwise dated) PATIENT SURVEYS:  LEFS: 17/80  07/27/2022: 43/80  08/20/2022: 52/80 10/13/2022: 60/80 11/30/2022: 64/80  LOWER EXTREMITY ROM:   Active ROM Right eval Left eval Right 07/20/2022 Right 07/27/2022 Right 08/03/2022 Right 08/20/2022  Knee flexion 60 135 90 deg 117 123 135  Knee extension 5 lacking 8 hyper  5 hyper 5 hyper 5 hyper     PROM right knee 5-0-90 - 07/07/2022  LOWER EXTREMITY MMT:   Patient with good quad activation, slight extension lag with SLR  07/01/2022: patient able to perform SLR without lag   MMT Right eval Left eval Right 09/15/2022 Right 09/22/2022 Right 09/28/2022 Right 11/09/2022  Hip flexion 4- 4      Hip extension 4- 4 4  4    Hip abduction 4- 4 4  4    Knee flexion - 5 4  4 4   Knee extension - 5 4 4 4 4      HHD Right 10/07/2022  Left 10/07/2022 Rt / Lt (avg) 11/30/2022  Knee flexion 45.3, 45.2, 46.9 45.8 avg 60.6, 61, 54.2 58.6 avg   Knee extension 38.8, 42.1, 42.6 41.2 avg 65.3, 73.1, 63 67.1 avg 66 / 77    Quad limb symmetry:   10/07/2022: 61.4%  11/30/2022: 85% Hamstring limb symmetry:  10/07/2022: 78.2%  Hamstring/qaud ratio:  10/07/2022: 111%  FUNCTIONAL TESTS:  SL hop: patient exhibits stiff landing with decreased depth - 10/19/2022 11/30/2022: stiff landing with decreased depth, improved from last assessment but still with deficit compared to left  Jogging: stiff landing on right - 11/09/2022     TODAY'S TREATMENT: [facility] Adult PT Treatment:                                                DATE: 11/30/2022 Therapeutic Exercise: Treadmill: 3.5 mph x 2 min, 4.5 mph x 1 min, 3.5 mph x 1 min, 4.5 mph x 1 min, 3.5 mph x 1 min, 4.5 mph x 1 min, 3.5 mph x 1 min, 4.5 mph x 1 min Squat jump 3 x 5 SL rear foot elevated jump  3 x 5 each Hex bar deadlift 135# x 8, 155# 2 x 8 [place] spit squat with 10# bilat 3 x 8 each Modified side plank with super clamshell black at knees 2 x 10 each Knee extension machine SL 15# 3 x 6, 20# 2 x 6 Knee flexion machine SL 35# 3 x 8 each   [facility] Adult PT Treatment:                                                DATE: 11/23/2022 Therapeutic Exercise: Treadmill: 3.5 mph x 2 min, 4.5 mph x 1 min, 3.5 mph x 1 min, 4.5 mph x 1 min, 3.5 mph x 1 min Barbell box (20") squat 95# 2 x 8, 115# 2 x 8, 125# 2 x 8 SL leg press (cybex) 60# x 8, 80# 3 x 8, 90# x 8, 100# x 8 Forward 10" step-up with 15# bilat 3 x 10 each SL stance on Airex with 15# KB pass 3 x 20 each Knee extension machine SL 15# 3 x 6, 20# 2 x 6 Knee flexion machine SL 35# 3 x 8 each  [facility] Adult PT Treatment:                                                DATE: 11/16/2022 Therapeutic Exercise: Elliptical L5 R5 x 4 min while taking subjective Treadmill: 3.5 mph x 1 min, 4.5 mph x 1 min,  3.5 mph x 1 min, 4.5 mph x 1 min, 3.5  mph x 1 min Plyometric series: Box jump 8" x 10, 10" 2 x 10 SL 10" depth landing 2 x 5 each Hex bar deadlift 135# 2 x 8, 145# x 8, 155# 2 x 8 [place] spit squat with 10# bilat 3 x 8 Lateral band walk with black at ankles 3 x 2 lengths down/back Knee extension machine SL 10# 2 x 10, eccentric 25# 3 x 5  PATIENT EDUCATION:  Education details: HEP Person educated: Patient Education method: Programmer, multimedia, Demonstration, Actor cues, Verbal cues Education comprehension: verbalized understanding, returned demonstration, verbal cues required, tactile cues required, and needs further education   HOME EXERCISE PROGRAM: Access Code: [email or web address]     ASSESSMENT: CLINICAL IMPRESSION: Patient tolerated therapy well with no adverse effects. She is 5 months out from right ACL repair and continues to progress well with her right leg strength, hopping and jumping progression, and jogging progression. She does continue to exhibit a strength deficit of the right quad, hamstring, and gluteals, decreased control of the right knee with landing and gait deviations with jogging likely due to strength and control deficit. She is consistent with her HEP and is progressing with her independent gym strengthening, but does require skilled physical therapy services to progress her strengthening and SL control, guide her through the jumping and hopping progression, and her running progression.    OBJECTIVE IMPAIRMENTS Abnormal gait, decreased activity tolerance, decreased balance, difficulty walking, decreased ROM, decreased strength, impaired flexibility, improper body mechanics, and pain.    ACTIVITY LIMITATIONS lifting, bending, standing, squatting, stairs, transfers, and locomotion level   PARTICIPATION LIMITATIONS: meal prep, cleaning, shopping, community activity, and occupation   PERSONAL FACTORS Past/current experiences are also affecting patient's functional outcome.      GOALS: Goals reviewed with patient?  Yes   SHORT TERM GOALS: Target date: 07/25/2022    Patient will be I with initial HEP in order to progress with therapy. Baseline: HEP provided at eval 07/27/2022: independent with initial current HEP Goal status: MET   2.  Patient will perform SLR x 10 repetitions without extension lag in order to improve quad control with ambulation Baseline: good quad activation, slight extension lag with SLR, fatigues with repetitions 07/03/2022: patient able to perform SLR x 10 without lag Goal status: MET   3.  Patient will demonstrate normalize gait mechanics without AD in order to improve walking ability Baseline: patient using bilateral crutches and knee immobilizer locked in extension 07/27/2022: patient ambulating without device Goal status: MET   4.  Patient will demonstrate right knee AROM 5-0-90 deg in order to  Baseline: right knee AROM 5-60 deg 07/27/2022: right knee AROM 5 - 0 - 117 deg Goal status: MET   5. Patient will report pain level </= 2/10 with walking or PT related activities in order to allow progress with therapy and reduce functional limitations.            Baseline: 4/10  07/27/2022: patient denies pain with exercises Goal Status: MET   LONG TERM GOALS: Target date: 12/08/2022    Patient will be I with final HEP to maintain progress from PT. Baseline: HEP provided at eval 07/27/2022: progressing toward final HEP 08/20/2022: progressing 09/28/2022: progressing plyometrics 10/13/2022: progressing 11/30/2022: progressing her strength, jumping, running exercises Goal status: PARTIALLY MET   2.  Patient will report >/= 56/80 on LEFS in order to indicate improved functional ability without progression to running or jumping tasks Baseline:  17/80 07/27/2022: 43/80 08/20/2022: 54/80 10/13/2022: 60/80 Goal status: MET   3.  Patient will be able to demonstrate a squat without deviation or pain in order to progress strength and improve ability to perform household tasks Baseline:  unable at eval 07/27/2022: patient performing wall squats, requires cues for even weight distribution and proper knee alignment 08/20/2022: progressing weighted squats 09/28/2022: able to perform bodyweight squat without deficit, progressing weighted squats 10/13/2022: able to perform squat without deviation Goal status: MET   4.  Patient will demonstrate full right knee AROM in order to improve functional mobility and reduce limitations with movement Baseline: knee AROM 5-60 deg at evaluation 07/27/2022: right knee AROM 5 - 0 - 117 deg 08/20/2022: patient demonstrates full AROM Goal status: MET  5. Patient will report >/= 75/80 on LEFS in order to indicate return to running and jumping tasks without limitation to return to prior level of function. Baseline: 60/80 11/30/2022: 64/80 Goal status: PARTIALLY MET  6. Patient will demonstrate limb symmetry quad strength within 90% using HHD in order to indicate appropriate strength to return to prior level of activities.  Baseline: 61.4% 11/30/2022 85%  Goal status: PARTIALLY MET  7. Patient will demonstrate limb symmetry hop series within 90% in order to indicate strength and control for patient to return to prior level of activities.  Baseline: patient exhibits limitations with hopping on right  11/30/2022: limitations with hopping on right  Goal status: PARTIALLY MET    PLAN: PT FREQUENCY: 1x/week   PT DURATION: 8 weeks   PLANNED INTERVENTIONS: Therapeutic exercises, Therapeutic activity, Neuromuscular re-education, Balance training, Gait training, Patient/Family education, Self Care, Joint mobilization, Joint manipulation, Aquatic Therapy, Dry Needling, Electrical stimulation, Cryotherapy, Moist heat, Taping, Vasopneumatic device, Manual therapy, and Re-evaluation   PLAN FOR NEXT SESSION: Review HEP and progress PRN, progressing quad and hip strengthening, single leg stability, plyometrics, jogging   Hilda Blades, PT, DPT, LAT,  ATC 11/30/22  10:04 AM Phone: [phone] Fax: [phone]

## 2022-12-07 ENCOUNTER — Encounter: Payer: Self-pay | Admitting: Physical Therapy

## 2022-12-07 ENCOUNTER — Other Ambulatory Visit: Payer: Self-pay

## 2022-12-07 ENCOUNTER — Ambulatory Visit: Payer: 59 | Attending: Orthopaedic Surgery | Admitting: Physical Therapy

## 2022-12-07 DIAGNOSIS — M25561 Pain in right knee: Secondary | ICD-10-CM | POA: Insufficient documentation

## 2022-12-07 DIAGNOSIS — R2689 Other abnormalities of gait and mobility: Secondary | ICD-10-CM | POA: Insufficient documentation

## 2022-12-07 DIAGNOSIS — G8929 Other chronic pain: Secondary | ICD-10-CM | POA: Diagnosis not present

## 2022-12-07 DIAGNOSIS — M6281 Muscle weakness (generalized): Secondary | ICD-10-CM | POA: Insufficient documentation

## 2022-12-07 NOTE — Therapy (Signed)
OUTPATIENT PHYSICAL THERAPY TREATMENT NOTE   Patient Name: Cheryl Cole MRN: [id number] DOB:[birth year], 22 y.o., female Today's Date: 12/07/2022  PCP: Cleatrice Burke, MD REFERRING PROVIDER: Bjorn Pippin, MD   END OF SESSION:   PT End of Session - 12/07/22 [phone]     Visit Number 31    Number of Visits 36    Date for PT Re-Evaluation 02/01/23    Authorization Type [employer] / [employer]    Authorization Time Period pending    PT Start Time 0930    PT Stop Time 1015    PT Time Calculation (min) 45 min    Activity Tolerance Patient tolerated treatment well    Behavior During Therapy WFL for tasks assessed/performed                         Past Medical History:  Diagnosis Date   Torn meniscus    Past Surgical History:  Procedure Laterality Date   ANTERIOR CRUCIATE LIGAMENT REPAIR Right 06/25/2022   Procedure: RECONSTRUCTION ANTERIOR CRUCIATE LIGAMENT (ACL);  Surgeon: Bjorn Pippin, MD;  Location: [facility];  Service: Orthopedics;  Laterality: Right;   KNEE ARTHROSCOPY WITH ANTERIOR CRUCIATE LIGAMENT (ACL) REPAIR Left 02/05/2021   Procedure: KNEE ARTHROSCOPY WITH ANTERIOR CRUCIATE LIGAMENT (ACL) REPAIR WITH AUTOGRAFT;  Surgeon: Bjorn Pippin, MD;  Location: [facility];  Service: Orthopedics;  Laterality: Left;   KNEE ARTHROSCOPY WITH LATERAL MENISECTOMY  02/05/2021   Procedure: KNEE ARTHROSCOPY WITH LATERAL MENISECTOMY;  Surgeon: Bjorn Pippin, MD;  Location: [facility];  Service: Orthopedics;;   KNEE ARTHROSCOPY WITH MEDIAL MENISECTOMY Left 02/05/2021   Procedure: KNEE ARTHROSCOPY WITH  MEDIAL MENISCUS REPAIR;  Surgeon: Bjorn Pippin, MD;  Location: [facility];  Service: Orthopedics;  Laterality: Left;   KNEE ARTHROSCOPY WITH MEDIAL MENISECTOMY Right 06/25/2022   Procedure: KNEE ARTHROSCOPY WITH MEDIAL MENISECTOMY;  Surgeon: Bjorn Pippin, MD;  Location: [facility];  Service: Orthopedics;   Laterality: Right;   There are no problems to display for this patient.   REFERRING DIAG: Right knee arthroscopy with ACL reconstruction with BTB autograft and medical meniscus repair 8/24  THERAPY DIAG:  Chronic pain of right knee  Muscle weakness (generalized)  Other abnormalities of gait and mobility  Rationale for Evaluation and Treatment Rehabilitation  PERTINENT HISTORY: s/p right ACL reconstruction with BTB autograft, medial meniscectomy, lateral meniscus repair 06/25/2022  PRECAUTIONS: Knee   SUBJECTIVE: Patient reports she continues to do well. She has been consistent with her gym exercises but has not been a consistent with her running and jumping progressions at home.  PAIN:  Are you having pain? 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   OBJECTIVE: (objective measures completed at initial evaluation unless otherwise dated) PATIENT SURVEYS:  LEFS: 17/80  07/27/2022: 43/80  08/20/2022: 52/80 10/13/2022: 60/80 11/30/2022: 64/80 12/07/2022: 65/80  LOWER EXTREMITY ROM:   Active ROM Right eval Left eval Right 07/20/2022 Right 07/27/2022 Right 08/03/2022 Right 08/20/2022  Knee flexion 60 135 90 deg 117 123 135  Knee extension 5 lacking 8 hyper  5 hyper 5 hyper 5 hyper     PROM right knee 5-0-90 - 07/07/2022  LOWER EXTREMITY MMT:   Patient with good quad activation, slight extension lag with SLR  07/01/2022: patient  able to perform SLR without lag   MMT Right eval Left eval Right 09/15/2022 Right 09/22/2022 Right 09/28/2022 Right 11/09/2022  Hip flexion 4- 4      Hip extension 4- 4 4  4    Hip abduction 4- 4 4  4    Knee flexion - 5 4  4 4   Knee extension - 5 4 4 4 4      HHD Right 10/07/2022 Left 10/07/2022 Rt / Lt (avg) 11/30/2022  Knee flexion 45.3, 45.2,  46.9 45.8 avg 60.6, 61, 54.2 58.6 avg   Knee extension 38.8, 42.1, 42.6 41.2 avg 65.3, 73.1, 63 67.1 avg 66 / 77    Quad limb symmetry:   10/07/2022: 61.4%  11/30/2022: 85% Hamstring limb symmetry:  10/07/2022: 78.2%  Hamstring/qaud ratio:  10/07/2022: 111%  FUNCTIONAL TESTS:  SL hop: patient exhibits stiff landing with decreased depth - 10/19/2022 11/30/2022: stiff landing with decreased depth, improved from last assessment but still with deficit compared to left  Jogging: stiff landing on right - 11/09/2022     TODAY'S TREATMENT: [facility] Adult PT Treatment:                                                DATE: 12/07/2022 Therapeutic Exercise: Treadmill: 3.5 mph x 2 min, 4.5 mph x 1 min, 3.5 mph x 1 min, 4.5 mph x 1 min, 3.5 mph x 1 min, 4.5 mph x 1 min, 3.5 mph x 1 min, 4.5 mph x 1 min SL rear foot elevated jump  3 x 5 each Barbell box (20") squat 95# x 10, 125# x 8, 135# x 8, 145# 2 x 6 Forward lunge with 10# bilat 3 x 6 each Forward 12" runner step-up with 10# bilat 3 x 8 Forward 6" heel tap 3 x 5 SL RDL with 25# 3 x 10 Knee extension machine SL 15# 3 x 6, 20# 2 x 6 Knee flexion machine SL 35# 3 x 8 each   [facility] Adult PT Treatment:                                                DATE: 11/30/2022 Therapeutic Exercise: Treadmill: 3.5 mph x 2 min, 4.5 mph x 1 min, 3.5 mph x 1 min, 4.5 mph x 1 min, 3.5 mph x 1 min, 4.5 mph x 1 min, 3.5 mph x 1 min, 4.5 mph x 1 min Squat jump 3 x 5 SL rear foot elevated jump  3 x 5 each Hex bar deadlift 135# x 8, 155# 2 x 8 [place] spit squat with 10# bilat 3 x 8 each Modified side plank with super clamshell black at knees 2 x 10 each Knee extension machine SL 15# 3 x 6, 20# 2 x 6 Knee flexion machine SL 35# 3 x 8 each  [facility] Adult PT Treatment:                                                DATE: 11/23/2022 Therapeutic Exercise: Treadmill: 3.5 mph x 2 min, 4.5 mph x 1 min, 3.5 mph x 1 min, 4.5 mph  x 1 min, 3.5 mph x 1 min Barbell box (20") squat  95# 2 x 8, 115# 2 x 8, 125# 2 x 8 SL leg press (cybex) 60# x 8, 80# 3 x 8, 90# x 8, 100# x 8 Forward 10" step-up with 15# bilat 3 x 10 each SL stance on Airex with 15# KB pass 3 x 20 each Knee extension machine SL 15# 3 x 6, 20# 2 x 6 Knee flexion machine SL 35# 3 x 8 each  [facility] Adult PT Treatment:                                                DATE: 11/16/2022 Therapeutic Exercise: Elliptical L5 R5 x 4 min while taking subjective Treadmill: 3.5 mph x 1 min, 4.5 mph x 1 min, 3.5 mph x 1 min, 4.5 mph x 1 min, 3.5 mph x 1 min Plyometric series: Box jump 8" x 10, 10" 2 x 10 SL 10" depth landing 2 x 5 each Hex bar deadlift 135# 2 x 8, 145# x 8, 155# 2 x 8 [place] spit squat with 10# bilat 3 x 8 Lateral band walk with black at ankles 3 x 2 lengths down/back Knee extension machine SL 10# 2 x 10, eccentric 25# 3 x 5  PATIENT EDUCATION:  Education details: HEP Person educated: Patient Education method: Consulting civil engineer, Demonstration, Corporate treasurer cues, Verbal cues Education comprehension: verbalized understanding, returned demonstration, verbal cues required, tactile cues required, and needs further education   HOME EXERCISE PROGRAM: Access Code: [email or web address]     ASSESSMENT: CLINICAL IMPRESSION: Patient tolerated therapy well with no adverse effects. Therapy focuses on continued progression of right knee and hip strengthening, progressing her SL control and stability, progression of running and jumping. She is progressing well in therapy and does report continued improvement in her functional ability on LEFS. As noted last visit she does continue to exhibit strength deficits of the right knee and demonstrates deviations in her gait with running and with landing mechanics on the right. Patient does require cueing throughout the session for proper knee and hip control, and with overall proper landing and exercise technique and control. Patient continues to require skilled physical therapy services to  progress her strengthening and SL control, guide her through the jumping and hopping progression, and her running progression so she can return to her prior level of function without pain or limitation.    OBJECTIVE IMPAIRMENTS Abnormal gait, decreased activity tolerance, decreased balance, difficulty walking, decreased ROM, decreased strength, impaired flexibility, improper body mechanics, and pain.    ACTIVITY LIMITATIONS lifting, bending, standing, squatting, stairs, transfers, and locomotion level   PARTICIPATION LIMITATIONS: meal prep, cleaning, shopping, community activity, and occupation   PERSONAL FACTORS Past/current experiences are also affecting patient's functional outcome.      GOALS: Goals reviewed with patient? Yes   SHORT TERM GOALS: Target date: 07/25/2022    Patient will be I with initial HEP in order to progress with therapy. Baseline: HEP provided at eval 07/27/2022: independent with initial current HEP Goal status: MET   2.  Patient will perform SLR x 10 repetitions without extension lag in order to improve quad control with ambulation Baseline: good quad activation, slight extension lag with SLR, fatigues with repetitions 07/03/2022: patient able to perform SLR x 10 without lag Goal status: MET   3.  Patient will demonstrate  normalize gait mechanics without AD in order to improve walking ability Baseline: patient using bilateral crutches and knee immobilizer locked in extension 07/27/2022: patient ambulating without device Goal status: MET   4.  Patient will demonstrate right knee AROM 5-0-90 deg in order to  Baseline: right knee AROM 5-60 deg 07/27/2022: right knee AROM 5 - 0 - 117 deg Goal status: MET   5. Patient will report pain level </= 2/10 with walking or PT related activities in order to allow progress with therapy and reduce functional limitations.            Baseline: 4/10  07/27/2022: patient denies pain with exercises Goal Status: MET   LONG TERM  GOALS: Target date: 02/01/2023   Patient will be I with final HEP to maintain progress from PT. Baseline: HEP provided at eval 07/27/2022: progressing toward final HEP 08/20/2022: progressing 09/28/2022: progressing plyometrics 10/13/2022: progressing 11/30/2022: progressing her strength, jumping, running exercises Goal status: PARTIALLY MET   2.  Patient will report >/= 56/80 on LEFS in order to indicate improved functional ability without progression to running or jumping tasks Baseline: 17/80 07/27/2022: 43/80 08/20/2022: 54/80 10/13/2022: 60/80 Goal status: MET   3.  Patient will be able to demonstrate a squat without deviation or pain in order to progress strength and improve ability to perform household tasks Baseline: unable at eval 07/27/2022: patient performing wall squats, requires cues for even weight distribution and proper knee alignment 08/20/2022: progressing weighted squats 09/28/2022: able to perform bodyweight squat without deficit, progressing weighted squats 10/13/2022: able to perform squat without deviation Goal status: MET   4.  Patient will demonstrate full right knee AROM in order to improve functional mobility and reduce limitations with movement Baseline: knee AROM 5-60 deg at evaluation 07/27/2022: right knee AROM 5 - 0 - 117 deg 08/20/2022: patient demonstrates full AROM Goal status: MET  5. Patient will report >/= 75/80 on LEFS in order to indicate return to running and jumping tasks without limitation to return to prior level of function. Baseline: 60/80 11/30/2022: 64/80 12/07/2022: 65/80 Goal status: PARTIALLY MET  6. Patient will demonstrate limb symmetry quad strength within 90% using HHD in order to indicate appropriate strength to return to prior level of activities.  Baseline: 61.4% 11/30/2022 85%  Goal status: PARTIALLY MET  7. Patient will demonstrate limb symmetry hop series within 90% in order to indicate strength and control for patient to  return to prior level of activities.  Baseline: patient exhibits limitations with hopping on right  11/30/2022: limitations with hopping on right  Goal status: PARTIALLY MET    PLAN: PT FREQUENCY: 1x/week   PT DURATION: 8 weeks   PLANNED INTERVENTIONS: Therapeutic exercises, Therapeutic activity, Neuromuscular re-education, Balance training, Gait training, Patient/Family education, Self Care, Joint mobilization, Joint manipulation, Aquatic Therapy, Dry Needling, Electrical stimulation, Cryotherapy, Moist heat, Taping, Vasopneumatic device, Manual therapy, and Re-evaluation   PLAN FOR NEXT SESSION: Review HEP and progress PRN, progressing quad and hip strengthening, single leg stability, plyometrics, jogging   Hilda Blades, PT, DPT, LAT, ATC 12/07/22  10:22 AM Phone: [phone] Fax: [phone]

## 2022-12-17 ENCOUNTER — Other Ambulatory Visit: Payer: Self-pay

## 2022-12-17 ENCOUNTER — Encounter: Payer: Self-pay | Admitting: Physical Therapy

## 2022-12-17 ENCOUNTER — Ambulatory Visit: Payer: 59 | Admitting: Physical Therapy

## 2022-12-17 DIAGNOSIS — G8929 Other chronic pain: Secondary | ICD-10-CM | POA: Diagnosis not present

## 2022-12-17 DIAGNOSIS — R2689 Other abnormalities of gait and mobility: Secondary | ICD-10-CM | POA: Diagnosis not present

## 2022-12-17 DIAGNOSIS — M6281 Muscle weakness (generalized): Secondary | ICD-10-CM

## 2022-12-17 DIAGNOSIS — M25561 Pain in right knee: Secondary | ICD-10-CM | POA: Diagnosis not present

## 2022-12-17 NOTE — Therapy (Signed)
OUTPATIENT PHYSICAL THERAPY TREATMENT NOTE   Patient Name: Cheryl Cole MRN: [id number] DOB:2000/11/23, 22 y.o., female Today's Date: 12/17/2022  PCP: Lacie Draft, MD REFERRING PROVIDER: Hiram Gash, MD   END OF SESSION:   PT End of Session - 12/17/22 1020     Visit Number 32    Number of Visits 36    Date for PT Re-Evaluation 02/01/23    Authorization Type A[employer] / [employer]    Authorization Time Period 12/07/2022 - 01/31/2023    Authorization - Visit Number 1    Authorization - Number of Visits 4    PT Start Time [id number]    PT Stop Time 1100    PT Time Calculation (min) 45 min    Activity Tolerance Patient tolerated treatment well    Behavior During Therapy [facility] for tasks assessed/performed                          Past Medical History:  Diagnosis Date   Torn meniscus    Past Surgical History:  Procedure Laterality Date   ANTERIOR CRUCIATE LIGAMENT REPAIR Right 06/25/2022   Procedure: RECONSTRUCTION ANTERIOR CRUCIATE LIGAMENT (ACL);  Surgeon: Hiram Gash, MD;  Location: [place];  Service: Orthopedics;  Laterality: Right;   KNEE ARTHROSCOPY WITH ANTERIOR CRUCIATE LIGAMENT (ACL) REPAIR Left 02/05/2021   Procedure: KNEE ARTHROSCOPY WITH ANTERIOR CRUCIATE LIGAMENT (ACL) REPAIR WITH AUTOGRAFT;  Surgeon: Hiram Gash, MD;  Location: [place];  Service: Orthopedics;  Laterality: Left;   KNEE ARTHROSCOPY WITH LATERAL MENISECTOMY  02/05/2021   Procedure: KNEE ARTHROSCOPY WITH LATERAL MENISECTOMY;  Surgeon: Hiram Gash, MD;  Location: [place];  Service: Orthopedics;;   KNEE ARTHROSCOPY WITH MEDIAL MENISECTOMY Left 02/05/2021   Procedure: KNEE ARTHROSCOPY WITH  MEDIAL MENISCUS REPAIR;  Surgeon: Hiram Gash, MD;  Location: [place];  Service: Orthopedics;  Laterality: Left;   KNEE ARTHROSCOPY WITH MEDIAL MENISECTOMY Right 06/25/2022   Procedure: KNEE ARTHROSCOPY WITH MEDIAL MENISECTOMY;   Surgeon: Hiram Gash, MD;  Location: [place];  Service: Orthopedics;  Laterality: Right;   There are no problems to display for this patient.   REFERRING DIAG: Right knee arthroscopy with ACL reconstruction with BTB autograft and medical meniscus repair 8/24  THERAPY DIAG:  Chronic pain of right knee  Muscle weakness (generalized)  Other abnormalities of gait and mobility  Rationale for Evaluation and Treatment Rehabilitation  PERTINENT HISTORY: s/p right ACL reconstruction with BTB autograft, medial meniscectomy, lateral meniscus repair 06/25/2022  PRECAUTIONS: Knee   SUBJECTIVE: Patient reports she continues to do well. She has been consistent with her gym exercises but has not been a consistent with her running and jumping progressions at home.  PAIN:  Are you having pain? 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   OBJECTIVE: (objective measures completed at initial evaluation unless otherwise dated) PATIENT SURVEYS:  LEFS: 17/80  07/27/2022: 43/80  08/20/2022: 52/80 10/13/2022: 60/80 11/30/2022: 64/80 12/07/2022: 65/80  LOWER EXTREMITY ROM:   Active ROM Right eval Left eval Right 07/20/2022 Right 07/27/2022 Right 08/03/2022 Right 08/20/2022  Knee flexion 60 135 90 deg 117 123 135  Knee extension 5 lacking 8 hyper  5 hyper 5 hyper 5 hyper     PROM right knee 5-0-90 -  07/07/2022  LOWER EXTREMITY MMT:   Patient with good quad activation, slight extension lag with SLR  07/01/2022: patient able to perform SLR without lag   MMT Right eval Left eval Right 09/15/2022 Right 09/22/2022 Right 09/28/2022 Right 11/09/2022  Hip flexion 4- 4      Hip extension 4- 4 4  4   $ Hip abduction 4- 4 4  4   $ Knee flexion - 5 4  4 4  $ Knee extension - 5 4 4 4 4     $ HHD  Right 10/07/2022 Left 10/07/2022 Rt / Lt (avg) 11/30/2022  Knee flexion 45.3, 45.2, 46.9 45.8 avg 60.6, 61, 54.2 58.6 avg   Knee extension 38.8, 42.1, 42.6 41.2 avg 65.3, 73.1, 63 67.1 avg 66 / 77    Quad limb symmetry:   10/07/2022: 61.4%  11/30/2022: 85% Hamstring limb symmetry:  10/07/2022: 78.2%  Hamstring/qaud ratio:  10/07/2022: 111%  FUNCTIONAL TESTS:  SL hop: patient exhibits stiff landing with decreased depth - 10/19/2022 11/30/2022: stiff landing with decreased depth, improved from last assessment but still with deficit compared to left  Jogging: stiff landing on right - 11/09/2022     TODAY'S TREATMENT: [facility] Adult PT Treatment:                                                DATE: 12/17/2022 Therapeutic Exercise: Treadmill: 4.5 mph x 1 min x 3; 5.0 mph x 1 min x 2 Barbell box (18") squat 135# 2 x 8, 145# 3 x 6 Leg press (cybex) SL 80# x 10, 100# 2 x 10 (right 140# 3 x 10) Prone hamstring curl on FM 10# 3 x 12 Knee extension machine SL 25# 3 x 8 Hip abduction machine 42.5# 2 x 12 each   [facility] Adult PT Treatment:                                                DATE: 12/07/2022 Therapeutic Exercise: Treadmill: 3.5 mph x 2 min, 4.5 mph x 1 min, 3.5 mph x 1 min, 4.5 mph x 1 min, 3.5 mph x 1 min, 4.5 mph x 1 min, 3.5 mph x 1 min, 4.5 mph x 1 min SL rear foot elevated jump  3 x 5 each Barbell box (20") squat 95# x 10, 125# x 8, 135# x 8, 145# 2 x 6 Forward lunge with 10# bilat 3 x 6 each Forward 12" runner step-up with 10# bilat 3 x 8 Forward 6" heel tap 3 x 5 SL RDL with 25# 3 x 10 Knee extension machine SL 15# 3 x 6, 20# 2 x 6 Knee flexion machine SL 35# 3 x 8 each  [facility] Adult PT Treatment:                                                DATE: 11/30/2022 Therapeutic Exercise: Treadmill: 3.5 mph x 2 min, 4.5 mph x 1 min, 3.5 mph x 1 min, 4.5 mph x 1 min, 3.5 mph x 1 min, 4.5 mph x 1 min, 3.5 mph x 1 min, 4.5 mph x 1  min Squat jump 3 x 5 SL rear foot elevated jump  3 x 5  each Hex bar deadlift 135# x 8, 155# 2 x 8 [place] spit squat with 10# bilat 3 x 8 each Modified side plank with super clamshell black at knees 2 x 10 each Knee extension machine SL 15# 3 x 6, 20# 2 x 6 Knee flexion machine SL 35# 3 x 8 each  PATIENT EDUCATION:  Education details: HEP Person educated: Patient Education method: Consulting civil engineer, Demonstration, Tactile cues, Verbal cues Education comprehension: verbalized understanding, returned demonstration, verbal cues required, tactile cues required, and needs further education   HOME EXERCISE PROGRAM: Access Code: [email or web address]     ASSESSMENT: CLINICAL IMPRESSION: Patient tolerated therapy well with no adverse effects. Therapy continues to progress running and strengthening this visit. She was able to jog at a higher speed this visit and is progressing very well with her strengthening exercises, tolerating higher weight and reps with exercises. She does exhibit a slight shift with heavier squats and is able to correct with cueing. No pain reported with therapy, she does report muscular fatigue. Patient continues to require skilled physical therapy services to progress her strengthening and SL control, guide her through the jumping and hopping progression, and her running progression so she can return to her prior level of function without pain or limitation.    OBJECTIVE IMPAIRMENTS Abnormal gait, decreased activity tolerance, decreased balance, difficulty walking, decreased ROM, decreased strength, impaired flexibility, improper body mechanics, and pain.    ACTIVITY LIMITATIONS lifting, bending, standing, squatting, stairs, transfers, and locomotion level   PARTICIPATION LIMITATIONS: meal prep, cleaning, shopping, community activity, and occupation   PERSONAL FACTORS Past/current experiences are also affecting patient's functional outcome.      GOALS: Goals reviewed with patient? Yes   SHORT TERM GOALS: Target date: 07/25/2022     Patient will be I with initial HEP in order to progress with therapy. Baseline: HEP provided at eval 07/27/2022: independent with initial current HEP Goal status: MET   2.  Patient will perform SLR x 10 repetitions without extension lag in order to improve quad control with ambulation Baseline: good quad activation, slight extension lag with SLR, fatigues with repetitions 07/03/2022: patient able to perform SLR x 10 without lag Goal status: MET   3.  Patient will demonstrate normalize gait mechanics without AD in order to improve walking ability Baseline: patient using bilateral crutches and knee immobilizer locked in extension 07/27/2022: patient ambulating without device Goal status: MET   4.  Patient will demonstrate right knee AROM 5-0-90 deg in order to  Baseline: right knee AROM 5-60 deg 07/27/2022: right knee AROM 5 - 0 - 117 deg Goal status: MET   5. Patient will report pain level </= 2/10 with walking or PT related activities in order to allow progress with therapy and reduce functional limitations.            Baseline: 4/10  07/27/2022: patient denies pain with exercises Goal Status: MET   LONG TERM GOALS: Target date: 02/01/2023   Patient will be I with final HEP to maintain progress from PT. Baseline: HEP provided at eval 07/27/2022: progressing toward final HEP 08/20/2022: progressing 09/28/2022: progressing plyometrics 10/13/2022: progressing 11/30/2022: progressing her strength, jumping, running exercises Goal status: PARTIALLY MET   2.  Patient will report >/= 56/80 on LEFS in order to indicate improved functional ability without progression to running or jumping tasks Baseline: 17/80 07/27/2022: 43/80 08/20/2022: 54/80 10/13/2022: 60/80 Goal status: MET  3.  Patient will be able to demonstrate a squat without deviation or pain in order to progress strength and improve ability to perform household tasks Baseline: unable at eval 07/27/2022: patient performing wall  squats, requires cues for even weight distribution and proper knee alignment 08/20/2022: progressing weighted squats 09/28/2022: able to perform bodyweight squat without deficit, progressing weighted squats 10/13/2022: able to perform squat without deviation Goal status: MET   4.  Patient will demonstrate full right knee AROM in order to improve functional mobility and reduce limitations with movement Baseline: knee AROM 5-60 deg at evaluation 07/27/2022: right knee AROM 5 - 0 - 117 deg 08/20/2022: patient demonstrates full AROM Goal status: MET  5. Patient will report >/= 75/80 on LEFS in order to indicate return to running and jumping tasks without limitation to return to prior level of function. Baseline: 60/80 11/30/2022: 64/80 12/07/2022: 65/80 Goal status: PARTIALLY MET  6. Patient will demonstrate limb symmetry quad strength within 90% using HHD in order to indicate appropriate strength to return to prior level of activities.  Baseline: 61.4% 11/30/2022 85%  Goal status: PARTIALLY MET  7. Patient will demonstrate limb symmetry hop series within 90% in order to indicate strength and control for patient to return to prior level of activities.  Baseline: patient exhibits limitations with hopping on right  11/30/2022: limitations with hopping on right  Goal status: PARTIALLY MET    PLAN: PT FREQUENCY: 1x/week   PT DURATION: 8 weeks   PLANNED INTERVENTIONS: Therapeutic exercises, Therapeutic activity, Neuromuscular re-education, Balance training, Gait training, Patient/Family education, Self Care, Joint mobilization, Joint manipulation, Aquatic Therapy, Dry Needling, Electrical stimulation, Cryotherapy, Moist heat, Taping, Vasopneumatic device, Manual therapy, and Re-evaluation   PLAN FOR NEXT SESSION: Review HEP and progress PRN, progressing quad and hip strengthening, single leg stability, plyometrics, jogging   Hilda Blades, PT, DPT, LAT, ATC 12/17/22  11:03 AM Phone:  [phone] Fax: [phone]

## 2022-12-22 DIAGNOSIS — S83241D Other tear of medial meniscus, current injury, right knee, subsequent encounter: Secondary | ICD-10-CM | POA: Diagnosis not present

## 2022-12-22 DIAGNOSIS — S83281D Other tear of lateral meniscus, current injury, right knee, subsequent encounter: Secondary | ICD-10-CM | POA: Diagnosis not present

## 2022-12-25 NOTE — Therapy (Signed)
OUTPATIENT PHYSICAL THERAPY TREATMENT NOTE   Patient Name: Cheryl Cole MRN: [id number] DOB:October 12, 2001, 22 y.o., female Today's Date: 12/28/2022  PCP: Lacie Draft, MD REFERRING PROVIDER: Hiram Gash, MD   END OF SESSION:   PT End of Session - 12/28/22 0949     Visit Number 33    Number of Visits 36    Date for PT Re-Evaluation 02/01/23    Authorization Type A[employer] / [employer]    Authorization Time Period 12/07/2022 - 01/31/2023    Authorization - Visit Number 2    Authorization - Number of Visits 4    PT Start Time 0930    PT Stop Time 1015    PT Time Calculation (min) 45 min    Activity Tolerance Patient tolerated treatment well    Behavior During Therapy [facility] for tasks assessed/performed                           Past Medical History:  Diagnosis Date   Torn meniscus    Past Surgical History:  Procedure Laterality Date   ANTERIOR CRUCIATE LIGAMENT REPAIR Right 06/25/2022   Procedure: RECONSTRUCTION ANTERIOR CRUCIATE LIGAMENT (ACL);  Surgeon: Hiram Gash, MD;  Location: [place];  Service: Orthopedics;  Laterality: Right;   KNEE ARTHROSCOPY WITH ANTERIOR CRUCIATE LIGAMENT (ACL) REPAIR Left 02/05/2021   Procedure: KNEE ARTHROSCOPY WITH ANTERIOR CRUCIATE LIGAMENT (ACL) REPAIR WITH AUTOGRAFT;  Surgeon: Hiram Gash, MD;  Location: [place];  Service: Orthopedics;  Laterality: Left;   KNEE ARTHROSCOPY WITH LATERAL MENISECTOMY  02/05/2021   Procedure: KNEE ARTHROSCOPY WITH LATERAL MENISECTOMY;  Surgeon: Hiram Gash, MD;  Location: [place];  Service: Orthopedics;;   KNEE ARTHROSCOPY WITH MEDIAL MENISECTOMY Left 02/05/2021   Procedure: KNEE ARTHROSCOPY WITH  MEDIAL MENISCUS REPAIR;  Surgeon: Hiram Gash, MD;  Location: [place];  Service: Orthopedics;  Laterality: Left;   KNEE ARTHROSCOPY WITH MEDIAL MENISECTOMY Right 06/25/2022   Procedure: KNEE ARTHROSCOPY WITH MEDIAL MENISECTOMY;   Surgeon: Hiram Gash, MD;  Location: [place];  Service: Orthopedics;  Laterality: Right;   There are no problems to display for this patient.   REFERRING DIAG: Right knee arthroscopy with ACL reconstruction with BTB autograft and medical meniscus repair 8/24  THERAPY DIAG:  Chronic pain of right knee  Muscle weakness (generalized)  Other abnormalities of gait and mobility  Rationale for Evaluation and Treatment Rehabilitation  PERTINENT HISTORY: s/p right ACL reconstruction with BTB autograft, medial meniscectomy, lateral meniscus repair 06/25/2022  PRECAUTIONS: Knee   SUBJECTIVE: Patient reports she is doing well with no new issues.She has continued with gym exercises but hasn't done as much running as she should.  PAIN:  Are you having pain? 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   OBJECTIVE: (objective measures completed at initial evaluation unless otherwise dated) PATIENT SURVEYS:  LEFS: 17/80  07/27/2022: 43/80  08/20/2022: 52/80 10/13/2022: 60/80 11/30/2022: 64/80 12/07/2022: 65/80  LOWER EXTREMITY ROM:   Active ROM Right eval Left eval Right 07/20/2022 Right 07/27/2022 Right 08/03/2022 Right 08/20/2022  Knee flexion 60 135 90 deg 117 123 135  Knee extension 5 lacking 8 hyper  5 hyper 5 hyper 5 hyper     PROM right knee 5-0-90 - 07/07/2022  LOWER EXTREMITY  MMT:   Patient with good quad activation, slight extension lag with SLR  07/01/2022: patient able to perform SLR without lag   MMT Right eval Left eval Right 09/15/2022 Right 09/22/2022 Right 09/28/2022 Right 11/09/2022  Hip flexion 4- 4      Hip extension 4- '4 4  4   '$ Hip abduction 4- '4 4  4   '$ Knee flexion - '5 4  4 4  '$ Knee extension - '5 4 4 4 4     '$ HHD Right 10/07/2022 Left 10/07/2022 Rt  / Lt (avg) 11/30/2022 Rt / Lt (avg) 12/28/2022  Knee flexion 45.3, 45.2, 46.9 45.8 avg 60.6, 61, 54.2 58.6 avg    Knee extension 38.8, 42.1, 42.6 41.2 avg 65.3, 73.1, 63 67.1 avg 66 / 77 72.9 / 83.3    Quad limb symmetry:   10/07/2022: 61.4%  11/30/2022: 85%  12/28/2022: 87.5% Hamstring limb symmetry:  10/07/2022: 78.2%  Hamstring/qaud ratio:  10/07/2022: 111%  FUNCTIONAL TESTS:  SL hop: patient exhibits stiff landing with decreased depth - 10/19/2022 11/30/2022: stiff landing with decreased depth, improved from last assessment but still with deficit compared to left  Jogging: stiff landing on right - 11/09/2022     TODAY'S TREATMENT: [facility] Adult PT Treatment:                                                DATE: 12/28/2022 Therapeutic Exercise: Elliptical L5 R5 x 5 min while taking subjective Leg press (cybex) SL 100# 2 x 10, 110# 2 x 10 (right 140# 2 x 10, 150# 2 x 10) Hex bar deadlift 135# x 10, 155# x 8, 165# x 6,  Prone hamstring curl on FM 10# 3 x 12 Spanish squat 3 x 10 SL on Airex rebounder with yellow med ball 3 x 15 each Knee extension machine SL 25# 3 x 8 on right (left 45# 3 x 8) Knee flexion machine SL 45# 3 x 8 each   [facility] Adult PT Treatment:                                                DATE: 12/17/2022 Therapeutic Exercise: Treadmill: 4.5 mph x 1 min x 3; 5.0 mph x 1 min x 2 Barbell box (18") squat 135# 2 x 8, 145# 3 x 6 Leg press (cybex) SL 80# x 10, 100# 2 x 10 (right 140# 3 x 10) Prone hamstring curl on FM 10# 3 x 12 Knee extension machine SL 25# 3 x 8 Hip abduction machine 42.5# 2 x 12 each  [facility] Adult PT Treatment:                                                DATE: 12/07/2022 Therapeutic Exercise: Treadmill: 3.5 mph x 2 min, 4.5 mph x 1 min, 3.5 mph x 1 min, 4.5 mph x 1 min, 3.5 mph x 1 min, 4.5 mph x 1 min, 3.5 mph x 1 min, 4.5 mph x 1 min SL rear foot elevated jump  3 x 5 each Barbell box (20") squat 95# x 10, 125# x 8,  135# x 8, 145# 2 x 6 Forward  lunge with 10# bilat 3 x 6 each Forward 12" runner step-up with 10# bilat 3 x 8 Forward 6" heel tap 3 x 5 SL RDL with 25# 3 x 10 Knee extension machine SL 15# 3 x 6, 20# 2 x 6 Knee flexion machine SL 35# 3 x 8 each  PATIENT EDUCATION:  Education details: HEP Person educated: Patient Education method: Consulting civil engineer, Demonstration, Tactile cues, Verbal cues Education comprehension: verbalized understanding, returned demonstration, verbal cues required, tactile cues required, and needs further education   HOME EXERCISE PROGRAM: Access Code: [email or web address]     ASSESSMENT: CLINICAL IMPRESSION: Patient tolerated therapy well with no adverse effects. She arrived in crocs so did not perform any running or jumping this visit. She does demonstrate improvement in her quad strength bilaterally this visit with an improved quad limb symmetry index. Therapy continues to focus on progressing her strength and she is progressing well with strengthening exercises, tolerating increased resistance. No pain reported with therapy, she does report muscular fatigue. Patient continues to require skilled physical therapy services to progress her strengthening and SL control, guide her through the jumping and hopping progression, and her running progression so she can return to her prior level of function without pain or limitation.    OBJECTIVE IMPAIRMENTS Abnormal gait, decreased activity tolerance, decreased balance, difficulty walking, decreased ROM, decreased strength, impaired flexibility, improper body mechanics, and pain.    ACTIVITY LIMITATIONS lifting, bending, standing, squatting, stairs, transfers, and locomotion level   PARTICIPATION LIMITATIONS: meal prep, cleaning, shopping, community activity, and occupation   PERSONAL FACTORS Past/current experiences are also affecting patient's functional outcome.      GOALS: Goals reviewed with patient? Yes   SHORT TERM GOALS: Target date: 07/25/2022    Patient  will be I with initial HEP in order to progress with therapy. Baseline: HEP provided at eval 07/27/2022: independent with initial current HEP Goal status: MET   2.  Patient will perform SLR x 10 repetitions without extension lag in order to improve quad control with ambulation Baseline: good quad activation, slight extension lag with SLR, fatigues with repetitions 07/03/2022: patient able to perform SLR x 10 without lag Goal status: MET   3.  Patient will demonstrate normalize gait mechanics without AD in order to improve walking ability Baseline: patient using bilateral crutches and knee immobilizer locked in extension 07/27/2022: patient ambulating without device Goal status: MET   4.  Patient will demonstrate right knee AROM 5-0-90 deg in order to  Baseline: right knee AROM 5-60 deg 07/27/2022: right knee AROM 5 - 0 - 117 deg Goal status: MET   5. Patient will report pain level </= 2/10 with walking or PT related activities in order to allow progress with therapy and reduce functional limitations.            Baseline: 4/10  07/27/2022: patient denies pain with exercises Goal Status: MET   LONG TERM GOALS: Target date: 02/01/2023   Patient will be I with final HEP to maintain progress from PT. Baseline: HEP provided at eval 07/27/2022: progressing toward final HEP 08/20/2022: progressing 09/28/2022: progressing plyometrics 10/13/2022: progressing 11/30/2022: progressing her strength, jumping, running exercises Goal status: PARTIALLY MET   2.  Patient will report >/= 56/80 on LEFS in order to indicate improved functional ability without progression to running or jumping tasks Baseline: 17/80 07/27/2022: 43/80 08/20/2022: 54/80 10/13/2022: 60/80 Goal status: MET   3.  Patient will be able to demonstrate a  squat without deviation or pain in order to progress strength and improve ability to perform household tasks Baseline: unable at eval 07/27/2022: patient performing wall squats,  requires cues for even weight distribution and proper knee alignment 08/20/2022: progressing weighted squats 09/28/2022: able to perform bodyweight squat without deficit, progressing weighted squats 10/13/2022: able to perform squat without deviation Goal status: MET   4.  Patient will demonstrate full right knee AROM in order to improve functional mobility and reduce limitations with movement Baseline: knee AROM 5-60 deg at evaluation 07/27/2022: right knee AROM 5 - 0 - 117 deg 08/20/2022: patient demonstrates full AROM Goal status: MET  5. Patient will report >/= 75/80 on LEFS in order to indicate return to running and jumping tasks without limitation to return to prior level of function. Baseline: 60/80 11/30/2022: 64/80 12/07/2022: 65/80 Goal status: PARTIALLY MET  6. Patient will demonstrate limb symmetry quad strength within 90% using HHD in order to indicate appropriate strength to return to prior level of activities.  Baseline: 61.4% 11/30/2022 85%  Goal status: PARTIALLY MET  7. Patient will demonstrate limb symmetry hop series within 90% in order to indicate strength and control for patient to return to prior level of activities.  Baseline: patient exhibits limitations with hopping on right  11/30/2022: limitations with hopping on right  Goal status: PARTIALLY MET    PLAN: PT FREQUENCY: 1x/week   PT DURATION: 8 weeks   PLANNED INTERVENTIONS: Therapeutic exercises, Therapeutic activity, Neuromuscular re-education, Balance training, Gait training, Patient/Family education, Self Care, Joint mobilization, Joint manipulation, Aquatic Therapy, Dry Needling, Electrical stimulation, Cryotherapy, Moist heat, Taping, Vasopneumatic device, Manual therapy, and Re-evaluation   PLAN FOR NEXT SESSION: Review HEP and progress PRN, progressing quad and hip strengthening, single leg stability, plyometrics, jogging   Hilda Blades, PT, DPT, LAT, ATC 12/28/22  10:31 AM Phone:  [phone] Fax: [phone]

## 2022-12-28 ENCOUNTER — Encounter: Payer: Self-pay | Admitting: Physical Therapy

## 2022-12-28 ENCOUNTER — Other Ambulatory Visit: Payer: Self-pay

## 2022-12-28 ENCOUNTER — Ambulatory Visit: Payer: 59 | Admitting: Physical Therapy

## 2022-12-28 DIAGNOSIS — G8929 Other chronic pain: Secondary | ICD-10-CM

## 2022-12-28 DIAGNOSIS — M6281 Muscle weakness (generalized): Secondary | ICD-10-CM

## 2022-12-28 DIAGNOSIS — R2689 Other abnormalities of gait and mobility: Secondary | ICD-10-CM

## 2022-12-28 DIAGNOSIS — M25561 Pain in right knee: Secondary | ICD-10-CM | POA: Diagnosis not present

## 2023-01-11 ENCOUNTER — Other Ambulatory Visit: Payer: Self-pay

## 2023-01-11 ENCOUNTER — Ambulatory Visit: Payer: 59 | Attending: Orthopaedic Surgery | Admitting: Physical Therapy

## 2023-01-11 ENCOUNTER — Encounter: Payer: Self-pay | Admitting: Physical Therapy

## 2023-01-11 DIAGNOSIS — M25561 Pain in right knee: Secondary | ICD-10-CM | POA: Insufficient documentation

## 2023-01-11 DIAGNOSIS — R2689 Other abnormalities of gait and mobility: Secondary | ICD-10-CM | POA: Diagnosis not present

## 2023-01-11 DIAGNOSIS — M6281 Muscle weakness (generalized): Secondary | ICD-10-CM | POA: Diagnosis not present

## 2023-01-11 DIAGNOSIS — G8929 Other chronic pain: Secondary | ICD-10-CM | POA: Insufficient documentation

## 2023-01-11 NOTE — Therapy (Signed)
OUTPATIENT PHYSICAL THERAPY TREATMENT NOTE   Patient Name: Cheryl Cole MRN: [id number] DOB:11-Dec-2000, 22 y.o., female Today's Date: 01/11/2023  PCP: Lacie Draft, MD REFERRING PROVIDER: Hiram Gash, MD   END OF SESSION:   PT End of Session - 01/11/23 1010     Visit Number 34    Number of Visits 36    Date for PT Re-Evaluation 02/01/23    Authorization Type [employer] / [employer]    Authorization Time Period 12/07/2022 - 01/31/2023    Authorization - Visit Number 3    Authorization - Number of Visits 4    PT Start Time 0930    PT Stop Time 1015    PT Time Calculation (min) 45 min    Activity Tolerance Patient tolerated treatment well    Behavior During Therapy [facility] for tasks assessed/performed                            Past Medical History:  Diagnosis Date   Torn meniscus    Past Surgical History:  Procedure Laterality Date   ANTERIOR CRUCIATE LIGAMENT REPAIR Right 06/25/2022   Procedure: RECONSTRUCTION ANTERIOR CRUCIATE LIGAMENT (ACL);  Surgeon: Hiram Gash, MD;  Location: [place];  Service: Orthopedics;  Laterality: Right;   KNEE ARTHROSCOPY WITH ANTERIOR CRUCIATE LIGAMENT (ACL) REPAIR Left 02/05/2021   Procedure: KNEE ARTHROSCOPY WITH ANTERIOR CRUCIATE LIGAMENT (ACL) REPAIR WITH AUTOGRAFT;  Surgeon: Hiram Gash, MD;  Location: [place];  Service: Orthopedics;  Laterality: Left;   KNEE ARTHROSCOPY WITH LATERAL MENISECTOMY  02/05/2021   Procedure: KNEE ARTHROSCOPY WITH LATERAL MENISECTOMY;  Surgeon: Hiram Gash, MD;  Location: [place];  Service: Orthopedics;;   KNEE ARTHROSCOPY WITH MEDIAL MENISECTOMY Left 02/05/2021   Procedure: KNEE ARTHROSCOPY WITH  MEDIAL MENISCUS REPAIR;  Surgeon: Hiram Gash, MD;  Location: [place];  Service: Orthopedics;  Laterality: Left;   KNEE ARTHROSCOPY WITH MEDIAL MENISECTOMY Right 06/25/2022   Procedure: KNEE ARTHROSCOPY WITH MEDIAL MENISECTOMY;   Surgeon: Hiram Gash, MD;  Location: [place];  Service: Orthopedics;  Laterality: Right;   There are no problems to display for this patient.   REFERRING DIAG: Right knee arthroscopy with ACL reconstruction with BTB autograft and medical meniscus repair 8/24  THERAPY DIAG:  Chronic pain of right knee  Muscle weakness (generalized)  Other abnormalities of gait and mobility  Rationale for Evaluation and Treatment Rehabilitation  PERTINENT HISTORY: s/p right ACL reconstruction with BTB autograft, medial meniscectomy, lateral meniscus repair 06/25/2022  PRECAUTIONS: Knee   SUBJECTIVE: Patient reports she continues to do well. She states she has been working on strengthening but has not been doing her running. Denies any knee pain.  PAIN:  Are you having pain? 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   OBJECTIVE: (objective measures completed at initial evaluation unless otherwise dated) PATIENT SURVEYS:  LEFS: 17/80  07/27/2022: 43/80  08/20/2022: 52/80 10/13/2022: 60/80 11/30/2022: 64/80 12/07/2022: 65/80  LOWER EXTREMITY ROM:   Active ROM Right eval Left eval Right 07/20/2022 Right 07/27/2022 Right 08/03/2022 Right 08/20/2022  Knee flexion 60 135 90 deg 117 123 135  Knee extension 5 lacking 8 hyper  5 hyper 5 hyper 5 hyper     PROM right knee 5-0-90 - 07/07/2022  LOWER EXTREMITY MMT:   Patient with good quad activation, slight extension lag with SLR  07/01/2022: patient able to perform SLR without lag   MMT Right eval Left eval Right 09/15/2022 Right 09/22/2022 Right 09/28/2022 Right 11/09/2022  Hip flexion 4- 4      Hip extension 4- '4 4  4   '$ Hip abduction 4- '4 4  4   '$ Knee flexion - '5 4  4 4  '$ Knee extension - '5 4 4 4 4     '$ HHD Right 10/07/2022  Left 10/07/2022 Rt / Lt (avg) 11/30/2022 Rt / Lt (avg) 12/28/2022  Knee flexion 45.3, 45.2, 46.9 45.8 avg 60.6, 61, 54.2 58.6 avg    Knee extension 38.8, 42.1, 42.6 41.2 avg 65.3, 73.1, 63 67.1 avg 66 / 77 72.9 / 83.3    Quad limb symmetry:   10/07/2022: 61.4%  11/30/2022: 85%  12/28/2022: 87.5% Hamstring limb symmetry:  10/07/2022: 78.2%  Hamstring/qaud ratio:  10/07/2022: 111%  FUNCTIONAL TESTS:  SL hop: patient exhibits stiff landing with decreased depth - 10/19/2022 11/30/2022: stiff landing with decreased depth, improved from last assessment but still with deficit compared to left  Jogging: stiff landing on right - 11/09/2022  Jump Testing: SL broad jump: 01/11/2023: left 38", right 21.5" (best of 3 trials)     TODAY'S TREATMENT: [facility] Adult PT Treatment:                                                DATE: 01/11/2023 Therapeutic Exercise: Treadmill: 4.5 mph x 1 min x 1; 5.0 mph x 1 min x 3 DL broad jump 4 x 5 Forward and lateral DL hurdle jump 2 x 5 each Forward alternating SL hop 2 x 10 Forward SL hop 2 x 5 Rear foot elevated SL hop 3 x 5 each Barbell box (18") squat 135# x 8, 145# 2 x 6 Knee extension machine SL 35# 3 x 6 on right (left 45# 3 x 8) Knee flexion machine SL 45# 3 x 8 each   [facility] Adult PT Treatment:                                                DATE: 12/17/2022 Therapeutic Exercise: Treadmill: 4.5 mph x 1 min x 1; 5.0 mph x 1 min x 3 Barbell box (18") squat 135# 2 x 8, 145# 3 x 6 Leg press (cybex) SL 80# x 10, 100# 2 x 10 (right 140# 3 x 10) Prone hamstring curl on FM 10# 3 x 12 Knee extension machine SL 25# 3 x 8 Hip abduction machine 42.5# 2 x 12 each  [facility] Adult PT Treatment:                                                DATE: 12/07/2022 Therapeutic Exercise: Treadmill: 3.5 mph x 2 min, 4.5 mph x 1 min, 3.5 mph x 1 min, 4.5 mph x 1 min, 3.5 mph x 1 min, 4.5 mph x 1 min, 3.5 mph x 1 min, 4.5 mph x 1 min SL rear foot elevated jump  3 x  5 each Barbell  box (20") squat 95# x 10, 125# x 8, 135# x 8, 145# 2 x 6 Forward lunge with 10# bilat 3 x 6 each Forward 12" runner step-up with 10# bilat 3 x 8 Forward 6" heel tap 3 x 5 SL RDL with 25# 3 x 10 Knee extension machine SL 15# 3 x 6, 20# 2 x 6 Knee flexion machine SL 35# 3 x 8 each  PATIENT EDUCATION:  Education details: HEP Person educated: Patient Education method: Consulting civil engineer, Demonstration, Tactile cues, Verbal cues Education comprehension: verbalized understanding, returned demonstration, verbal cues required, tactile cues required, and needs further education   HOME EXERCISE PROGRAM: Access Code: [email or web address]     ASSESSMENT: CLINICAL IMPRESSION: Patient tolerated therapy well with no adverse effects. Therapy focused on progressing plyometrics and strengthening with good tolerance. She demonstrates progression in her jumping and strength, but she demonstrates continued limitation with SL jumping on right, and overall a limitation bilaterally compared to prior level. She demonstrates decreased knee flexion with landing on single leg jumps. She is tolerating increased resistance with exercises but does require cueing to avoid weight shift with squatting, and is able to correct when cued.  Patient continues to require skilled physical therapy services to progress her strengthening and SL control, guide her through the jumping and hopping progression, and her running progression so she can return to her prior level of function without pain or limitation.    OBJECTIVE IMPAIRMENTS Abnormal gait, decreased activity tolerance, decreased balance, difficulty walking, decreased ROM, decreased strength, impaired flexibility, improper body mechanics, and pain.    ACTIVITY LIMITATIONS lifting, bending, standing, squatting, stairs, transfers, and locomotion level   PARTICIPATION LIMITATIONS: meal prep, cleaning, shopping, community activity, and occupation   PERSONAL FACTORS Past/current experiences are  also affecting patient's functional outcome.      GOALS: Goals reviewed with patient? Yes   SHORT TERM GOALS: Target date: 07/25/2022    Patient will be I with initial HEP in order to progress with therapy. Baseline: HEP provided at eval 07/27/2022: independent with initial current HEP Goal status: MET   2.  Patient will perform SLR x 10 repetitions without extension lag in order to improve quad control with ambulation Baseline: good quad activation, slight extension lag with SLR, fatigues with repetitions 07/03/2022: patient able to perform SLR x 10 without lag Goal status: MET   3.  Patient will demonstrate normalize gait mechanics without AD in order to improve walking ability Baseline: patient using bilateral crutches and knee immobilizer locked in extension 07/27/2022: patient ambulating without device Goal status: MET   4.  Patient will demonstrate right knee AROM 5-0-90 deg in order to  Baseline: right knee AROM 5-60 deg 07/27/2022: right knee AROM 5 - 0 - 117 deg Goal status: MET   5. Patient will report pain level </= 2/10 with walking or PT related activities in order to allow progress with therapy and reduce functional limitations.            Baseline: 4/10  07/27/2022: patient denies pain with exercises Goal Status: MET   LONG TERM GOALS: Target date: 02/01/2023   Patient will be I with final HEP to maintain progress from PT. Baseline: HEP provided at eval 07/27/2022: progressing toward final HEP 08/20/2022: progressing 09/28/2022: progressing plyometrics 10/13/2022: progressing 11/30/2022: progressing her strength, jumping, running exercises Goal status: PARTIALLY MET   2.  Patient will report >/= 56/80 on LEFS in order to indicate improved functional ability without progression to running or  jumping tasks Baseline: 17/80 07/27/2022: 43/80 08/20/2022: 54/80 10/13/2022: 60/80 Goal status: MET   3.  Patient will be able to demonstrate a squat without deviation or  pain in order to progress strength and improve ability to perform household tasks Baseline: unable at eval 07/27/2022: patient performing wall squats, requires cues for even weight distribution and proper knee alignment 08/20/2022: progressing weighted squats 09/28/2022: able to perform bodyweight squat without deficit, progressing weighted squats 10/13/2022: able to perform squat without deviation Goal status: MET   4.  Patient will demonstrate full right knee AROM in order to improve functional mobility and reduce limitations with movement Baseline: knee AROM 5-60 deg at evaluation 07/27/2022: right knee AROM 5 - 0 - 117 deg 08/20/2022: patient demonstrates full AROM Goal status: MET  5. Patient will report >/= 75/80 on LEFS in order to indicate return to running and jumping tasks without limitation to return to prior level of function. Baseline: 60/80 11/30/2022: 64/80 12/07/2022: 65/80 Goal status: PARTIALLY MET  6. Patient will demonstrate limb symmetry quad strength within 90% using HHD in order to indicate appropriate strength to return to prior level of activities.  Baseline: 61.4% 11/30/2022 85%  Goal status: PARTIALLY MET  7. Patient will demonstrate limb symmetry hop series within 90% in order to indicate strength and control for patient to return to prior level of activities.  Baseline: patient exhibits limitations with hopping on right  11/30/2022: limitations with hopping on right  Goal status: PARTIALLY MET    PLAN: PT FREQUENCY: 1x/week   PT DURATION: 8 weeks   PLANNED INTERVENTIONS: Therapeutic exercises, Therapeutic activity, Neuromuscular re-education, Balance training, Gait training, Patient/Family education, Self Care, Joint mobilization, Joint manipulation, Aquatic Therapy, Dry Needling, Electrical stimulation, Cryotherapy, Moist heat, Taping, Vasopneumatic device, Manual therapy, and Re-evaluation   PLAN FOR NEXT SESSION: Review HEP and progress PRN, progressing  quad and hip strengthening, single leg stability, plyometrics, jogging   Hilda Blades, PT, DPT, LAT, ATC 01/11/23  11:11 AM Phone: [phone] Fax: [phone]

## 2023-01-25 ENCOUNTER — Encounter: Payer: Self-pay | Admitting: Physical Therapy

## 2023-01-25 ENCOUNTER — Other Ambulatory Visit: Payer: Self-pay

## 2023-01-25 ENCOUNTER — Ambulatory Visit: Payer: 59 | Admitting: Physical Therapy

## 2023-01-25 DIAGNOSIS — M6281 Muscle weakness (generalized): Secondary | ICD-10-CM | POA: Diagnosis not present

## 2023-01-25 DIAGNOSIS — R2689 Other abnormalities of gait and mobility: Secondary | ICD-10-CM | POA: Diagnosis not present

## 2023-01-25 DIAGNOSIS — G8929 Other chronic pain: Secondary | ICD-10-CM

## 2023-01-25 DIAGNOSIS — M25561 Pain in right knee: Secondary | ICD-10-CM | POA: Diagnosis not present

## 2023-01-25 NOTE — Therapy (Signed)
OUTPATIENT PHYSICAL THERAPY TREATMENT NOTE   Patient Name: Cheryl Cole MRN: [id number] DOB:28-Aug-2001, 22 y.o., female 51 Date: 01/25/2023  PCP: Lacie Draft, MD REFERRING PROVIDER: Hiram Gash, MD   END OF SESSION:   PT End of Session - 01/25/23 0930     Visit Number 35    Number of Visits 39    Date for PT Re-Evaluation 03/22/23    Authorization Type [employer] / [employer]    Authorization Time Period 12/07/2022 - 01/31/2023    Authorization - Visit Number 4    Authorization - Number of Visits 4    PT Start Time 0928    PT Stop Time 1015    PT Time Calculation (min) 47 min    Activity Tolerance Patient tolerated treatment well    Behavior During Therapy [facility] for tasks assessed/performed                             Past Medical History:  Diagnosis Date   Torn meniscus    Past Surgical History:  Procedure Laterality Date   ANTERIOR CRUCIATE LIGAMENT REPAIR Right 06/25/2022   Procedure: RECONSTRUCTION ANTERIOR CRUCIATE LIGAMENT (ACL);  Surgeon: Hiram Gash, MD;  Location: [place];  Service: Orthopedics;  Laterality: Right;   KNEE ARTHROSCOPY WITH ANTERIOR CRUCIATE LIGAMENT (ACL) REPAIR Left 02/05/2021   Procedure: KNEE ARTHROSCOPY WITH ANTERIOR CRUCIATE LIGAMENT (ACL) REPAIR WITH AUTOGRAFT;  Surgeon: Hiram Gash, MD;  Location: [place];  Service: Orthopedics;  Laterality: Left;   KNEE ARTHROSCOPY WITH LATERAL MENISECTOMY  02/05/2021   Procedure: KNEE ARTHROSCOPY WITH LATERAL MENISECTOMY;  Surgeon: Hiram Gash, MD;  Location: [place];  Service: Orthopedics;;   KNEE ARTHROSCOPY WITH MEDIAL MENISECTOMY Left 02/05/2021   Procedure: KNEE ARTHROSCOPY WITH  MEDIAL MENISCUS REPAIR;  Surgeon: Hiram Gash, MD;  Location: [place];  Service: Orthopedics;  Laterality: Left;   KNEE ARTHROSCOPY WITH MEDIAL MENISECTOMY Right 06/25/2022   Procedure: KNEE ARTHROSCOPY WITH MEDIAL MENISECTOMY;   Surgeon: Hiram Gash, MD;  Location: [place];  Service: Orthopedics;  Laterality: Right;   There are no problems to display for this patient.   REFERRING DIAG: Right knee arthroscopy with ACL reconstruction with BTB autograft and medical meniscus repair 8/24  THERAPY DIAG:  Chronic pain of right knee  Muscle weakness (generalized)  Other abnormalities of gait and mobility  Rationale for Evaluation and Treatment Rehabilitation  PERTINENT HISTORY: s/p right ACL reconstruction with BTB autograft, medial meniscectomy, lateral meniscus repair 06/25/2022  PRECAUTIONS: Knee   SUBJECTIVE: Patient reports she did testing at the doctors office and was told she was still weak and wasn't bending her knee as much with landing.  PAIN:  Are you having pain? 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   OBJECTIVE: (objective measures completed at initial evaluation unless otherwise dated) PATIENT SURVEYS:  LEFS: 17/80  07/27/2022: 43/80  08/20/2022: 52/80 10/13/2022: 60/80 11/30/2022: 64/80 12/07/2022: 65/80 01/25/2023: 76/80  LOWER EXTREMITY ROM:   Active ROM Right eval Left eval Right 07/20/2022 Right 07/27/2022 Right 08/03/2022 Right 08/20/2022  Knee flexion 60 135 90 deg 117 123 135  Knee extension 5 lacking 8 hyper  5 hyper 5 hyper 5 hyper     PROM right knee 5-0-90 -  07/07/2022  LOWER EXTREMITY MMT:   Patient with good quad activation, slight extension lag with SLR  07/01/2022: patient able to perform SLR without lag   MMT Right eval Left eval Right 09/15/2022 Right 09/22/2022 Right 09/28/2022 Right 11/09/2022  Hip flexion 4- 4      Hip extension 4- 4 4  4    Hip abduction 4- 4 4  4    Knee flexion - 5 4  4 4   Knee extension - 5 4 4 4 4      HHD Right 10/07/2022  Left 10/07/2022 Rt / Lt (avg) 11/30/2022 Rt / Lt (avg) 12/28/2022 Rt / Lt 01/25/2023  Knee flexion 45.3, 45.2, 46.9 45.8 avg 60.6, 61, 54.2 58.6 avg   51 / 51.3  Knee extension 38.8, 42.1, 42.6 41.2 avg 65.3, 73.1, 63 67.1 avg 66 / 77 72.9 / 83.3 84.6 / 112.3    Quad limb symmetry:   10/07/2022: 61.4%  11/30/2022: 85%  12/28/2022: 87.5%  01/25/2023: 75.3% Hamstring limb symmetry:  10/07/2022: 78.2%  01/25/2023: 99%  Hamstring/qaud ratio:  10/07/2022: 111%  FUNCTIONAL TESTS:  SL hop: patient exhibits stiff landing with decreased depth - 10/19/2022 11/30/2022: stiff landing with decreased depth, improved from last assessment but still with deficit compared to left  Jogging: stiff landing on right - 11/09/2022  Jump Testing: SL broad jump: 01/11/2023: left 38", right 21.5" (best of 3 trials)     TODAY'S TREATMENT: [facility] Adult PT Treatment:                                                DATE: 01/25/2023 Therapeutic Exercise: Elliptical L5 R5 x 5 min while taking subjective Leg press (cybex) DL 140# x 10, 200# x 10, 240# 2 x 10; SL 100# x 8, 120# 2 x 8 (right 140# 3 x 8) Hex bar deadlift 155# x 6, 175# x 6, 185# x 6, 195# 2 x 6 SL squat to 20" box 3 x 5 knee extension machine SL 35# 3 x 6 on right (left 45# 3 x 8) Knee flexion machine SL 45# 3 x 8 each   [facility] Adult PT Treatment:                                                DATE: 01/11/2023 Therapeutic Exercise Treadmill: 4.5 mph x 1 min x 1; 5.0 mph x 1 min x 3 DL broad jump 4 x 5 Forward and lateral DL hurdle jump 2 x 5 each Forward alternating SL hop 2 x 10 Forward SL hop 2 x 5 Rear foot elevated SL hop 3 x 5 each Barbell box (18") squat 135# x 8, 145# 2 x 6 Knee extension machine SL 35# 3 x 6 on right (left 45# 3 x 8) Knee flexion machine SL 45# 3 x 8 each  [facility] Adult PT Treatment:                                                DATE: 12/17/2022 Therapeutic Exercise: Treadmill: 4.5 mph x 1 min x 1; 5.0 mph x 1 min x 3  Barbell  box (18") squat 135# 2 x 8, 145# 3 x 6 Leg press (cybex) SL 80# x 10, 100# 2 x 10 (right 140# 3 x 10) Prone hamstring curl on FM 10# 3 x 12 Knee extension machine SL 25# 3 x 8 Hip abduction machine 42.5# 2 x 12 each  PATIENT EDUCATION:  Education details: POC extension, HEP Person educated: Patient Education method: Explanation, Demonstration, Tactile cues, Verbal cues Education comprehension: verbalized understanding, returned demonstration, verbal cues required, tactile cues required, and needs further education   HOME EXERCISE PROGRAM: Access Code: [email or web address]     ASSESSMENT: CLINICAL IMPRESSION: Patient tolerated therapy well with no adverse effects. She is 7 months post-op right ACL reconstruction with BTB autograft, medial meniscectomy, and lateral meniscus repair. She is progressing well in therapy, reporting improvement in her functional status on LEFS achieving her LTG, and progressing well with her strength but does continue to exhibit strength deficit of the right leg. She demonstrates improvement bilaterally with quad strength using HHD, and the left leg increased more which did lower the quad symmetry, but overall is getting stronger and demonstrating improvement. Her right hamstring also demonstrates improvement in strength with a decrease on the left, which has allowed the hamstring symmetry to be much improved. Did not perform any jumping this visit due to patient not wearing shoes to therapy, so will reassess jumping at next visit. Therapy continues to focus on progressing her strength and plyometrics, with focus on proper technique and control. Patient continues to require skilled physical therapy services to progress her strengthening and SL control, guide her through the jumping and hopping progression, and her running progression so she can return to her prior level of function without pain or limitation, and reduce the risk of future re-injury..    OBJECTIVE IMPAIRMENTS  Abnormal gait, decreased activity tolerance, decreased balance, difficulty walking, decreased ROM, decreased strength, impaired flexibility, improper body mechanics, and pain.    ACTIVITY LIMITATIONS lifting, bending, standing, squatting, stairs, transfers, and locomotion level   PARTICIPATION LIMITATIONS: meal prep, cleaning, shopping, community activity, and occupation   PERSONAL FACTORS Past/current experiences are also affecting patient's functional outcome.      GOALS: Goals reviewed with patient? Yes   SHORT TERM GOALS: Target date: 07/25/2022    Patient will be I with initial HEP in order to progress with therapy. Baseline: HEP provided at eval 07/27/2022: independent with initial current HEP Goal status: MET   2.  Patient will perform SLR x 10 repetitions without extension lag in order to improve quad control with ambulation Baseline: good quad activation, slight extension lag with SLR, fatigues with repetitions 07/03/2022: patient able to perform SLR x 10 without lag Goal status: MET   3.  Patient will demonstrate normalize gait mechanics without AD in order to improve walking ability Baseline: patient using bilateral crutches and knee immobilizer locked in extension 07/27/2022: patient ambulating without device Goal status: MET   4.  Patient will demonstrate right knee AROM 5-0-90 deg in order to  Baseline: right knee AROM 5-60 deg 07/27/2022: right knee AROM 5 - 0 - 117 deg Goal status: MET   5. Patient will report pain level </= 2/10 with walking or PT related activities in order to allow progress with therapy and reduce functional limitations.            Baseline: 4/10  07/27/2022: patient denies pain with exercises Goal Status: MET   LONG TERM GOALS: Target date: 03/22/2023   Patient will be I  with final HEP to maintain progress from PT. Baseline: HEP provided at eval 07/27/2022: progressing toward final HEP 08/20/2022: progressing 09/28/2022: progressing  plyometrics 10/13/2022: progressing 11/30/2022: progressing her strength, jumping, running exercises 01/25/2023: progressing her strength, jumping, running exercises Goal status: PARTIALLY MET   2.  Patient will report >/= 56/80 on LEFS in order to indicate improved functional ability without progression to running or jumping tasks Baseline: 17/80 07/27/2022: 43/80 08/20/2022: 54/80 10/13/2022: 60/80 Goal status: MET   3.  Patient will be able to demonstrate a squat without deviation or pain in order to progress strength and improve ability to perform household tasks Baseline: unable at eval 07/27/2022: patient performing wall squats, requires cues for even weight distribution and proper knee alignment 08/20/2022: progressing weighted squats 09/28/2022: able to perform bodyweight squat without deficit, progressing weighted squats 10/13/2022: able to perform squat without deviation Goal status: MET   4.  Patient will demonstrate full right knee AROM in order to improve functional mobility and reduce limitations with movement Baseline: knee AROM 5-60 deg at evaluation 07/27/2022: right knee AROM 5 - 0 - 117 deg 08/20/2022: patient demonstrates full AROM Goal status: MET  5. Patient will report >/= 75/80 on LEFS in order to indicate return to running and jumping tasks without limitation to return to prior level of function. Baseline: 60/80 11/30/2022: 64/80 12/07/2022: 65/80 01/25/2023: 76/80 Goal status:MET  6. Patient will demonstrate limb symmetry quad strength within 90% using HHD in order to indicate appropriate strength to return to prior level of activities.  Baseline: 61.4% 11/30/2022 85% 01/25/2023: 75.3%  Goal status: PARTIALLY MET  7. Patient will demonstrate limb symmetry hop series within 90% in order to indicate strength and control for patient to return to prior level of activities.  Baseline: patient exhibits limitations with hopping on right  11/30/2022: limitations with  hopping on right 01/25/2023: patient continues to demonstrate limitations with hopping/jumping on the right compared to left  Goal status: PARTIALLY MET    PLAN: PT FREQUENCY: Every other week   PT DURATION: 8 weeks   PLANNED INTERVENTIONS: Therapeutic exercises, Therapeutic activity, Neuromuscular re-education, Balance training, Gait training, Patient/Family education, Self Care, Joint mobilization, Joint manipulation, Aquatic Therapy, Dry Needling, Electrical stimulation, Cryotherapy, Moist heat, Taping, Vasopneumatic device, Manual therapy, and Re-evaluation   PLAN FOR NEXT SESSION: Review HEP and progress PRN, progressing quad and hip strengthening, single leg stability, plyometrics, jogging   Hilda Blades, PT, DPT, LAT, ATC 01/25/23  12:40 PM Phone: [phone] Fax: [phone]     Check all possible CPT codes: [id number] - PT Re-evaluation, 97110- Therapeutic Exercise, [phone]- Neuro Re-education, [phone] - Gait Training, [id number] - Manual Therapy, 97530 - Therapeutic Activities, [phone] - [place], and [phone] - Aquatic therapy    Check all conditions that are expected to impact treatment: None of these apply   If treatment provided at initial evaluation, no treatment charged due to lack of authorization.

## 2023-02-05 NOTE — Therapy (Signed)
OUTPATIENT PHYSICAL THERAPY TREATMENT NOTE   Patient Name: Cheryl Cole MRN: [id number] DOB:08-Jul-2001, 22 y.o., female Today's Date: 02/08/2023  PCP: Cleatrice Burke, MD REFERRING PROVIDER: Bjorn Pippin, MD   END OF SESSION:   PT End of Session - 02/08/23 0931     Visit Number 36    Number of Visits 39    Date for PT Re-Evaluation 03/22/23    Authorization Type [employer] / [employer]    Authorization Time Period 12/07/2022 - 01/31/2023    Authorization - Visit Number 1    Authorization - Number of Visits 2    PT Start Time 0931    PT Stop Time 1015    PT Time Calculation (min) 44 min    Activity Tolerance Patient tolerated treatment well    Behavior During Therapy [facility] for tasks assessed/performed                              Past Medical History:  Diagnosis Date   Torn meniscus    Past Surgical History:  Procedure Laterality Date   ANTERIOR CRUCIATE LIGAMENT REPAIR Right 06/25/2022   Procedure: RECONSTRUCTION ANTERIOR CRUCIATE LIGAMENT (ACL);  Surgeon: Bjorn Pippin, MD;  Location: [facility];  Service: Orthopedics;  Laterality: Right;   KNEE ARTHROSCOPY WITH ANTERIOR CRUCIATE LIGAMENT (ACL) REPAIR Left 02/05/2021   Procedure: KNEE ARTHROSCOPY WITH ANTERIOR CRUCIATE LIGAMENT (ACL) REPAIR WITH AUTOGRAFT;  Surgeon: Bjorn Pippin, MD;  Location: [facility];  Service: Orthopedics;  Laterality: Left;   KNEE ARTHROSCOPY WITH LATERAL MENISECTOMY  02/05/2021   Procedure: KNEE ARTHROSCOPY WITH LATERAL MENISECTOMY;  Surgeon: Bjorn Pippin, MD;  Location: [facility];  Service: Orthopedics;;   KNEE ARTHROSCOPY WITH MEDIAL MENISECTOMY Left 02/05/2021   Procedure: KNEE ARTHROSCOPY WITH  MEDIAL MENISCUS REPAIR;  Surgeon: Bjorn Pippin, MD;  Location: [facility];  Service: Orthopedics;  Laterality: Left;   KNEE ARTHROSCOPY WITH MEDIAL MENISECTOMY Right 06/25/2022   Procedure: KNEE ARTHROSCOPY WITH MEDIAL  MENISECTOMY;  Surgeon: Bjorn Pippin, MD;  Location: [facility];  Service: Orthopedics;  Laterality: Right;   There are no problems to display for this patient.   REFERRING DIAG: Right knee arthroscopy with ACL reconstruction with BTB autograft and medical meniscus repair 8/24  THERAPY DIAG:  Chronic pain of right knee  Muscle weakness (generalized)  Other abnormalities of gait and mobility  Rationale for Evaluation and Treatment Rehabilitation  PERTINENT HISTORY: s/p right ACL reconstruction with BTB autograft, medial meniscectomy, lateral meniscus repair 06/25/2022  PRECAUTIONS: Knee   SUBJECTIVE: Patient reports her knee knee is hurting a little because she has been doing more jumping since last visit and has an issue with bending her knee while landing, but she is working on it.   PAIN:  Are you having pain? 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   OBJECTIVE: (objective measures completed at initial evaluation unless otherwise dated) PATIENT SURVEYS:  LEFS: 17/80  07/27/2022: 43/80  08/20/2022: 52/80 10/13/2022: 60/80 11/30/2022: 64/80 12/07/2022: 65/80 01/25/2023: 76/80  LOWER EXTREMITY ROM:   Active ROM Right eval Left eval Right 07/20/2022 Right 07/27/2022 Right 08/03/2022 Right 08/20/2022  Knee flexion 60 135 90 deg 117 123 135  Knee extension 5 lacking 8 hyper  5 hyper  5 hyper 5 hyper     PROM right knee 5-0-90 - 07/07/2022  LOWER EXTREMITY MMT:   Patient with good quad activation, slight extension lag with SLR  07/01/2022: patient able to perform SLR without lag   MMT Right eval Left eval Right 09/15/2022 Right 09/22/2022 Right 09/28/2022 Right 11/09/2022  Hip flexion 4- 4      Hip extension 4- 4 4  4    Hip abduction 4- 4 4  4    Knee flexion - 5  4  4 4   Knee extension - 5 4 4 4 4      HHD Right 10/07/2022 Left 10/07/2022 Rt / Lt (avg) 11/30/2022 Rt / Lt (avg) 12/28/2022 Rt / Lt 01/25/2023  Knee flexion 45.3, 45.2, 46.9 45.8 avg 60.6, 61, 54.2 58.6 avg   51 / 51.3  Knee extension 38.8, 42.1, 42.6 41.2 avg 65.3, 73.1, 63 67.1 avg 66 / 77 72.9 / 83.3 84.6 / 112.3    Quad limb symmetry:   10/07/2022: 61.4%  11/30/2022: 85%  12/28/2022: 87.5%  01/25/2023: 75.3% Hamstring limb symmetry:  10/07/2022: 78.2%  01/25/2023: 99%  Hamstring/qaud ratio:  10/07/2022: 111%  FUNCTIONAL TESTS:  SL hop: patient exhibits stiff landing with decreased depth - 10/19/2022 11/30/2022: stiff landing with decreased depth, improved from last assessment but still with deficit compared to left  Jogging: stiff landing on right - 11/09/2022  Jump Testing: SL broad jump: 01/11/2023: left 38", right 21.5" (best of 3 trials)     TODAY'S TREATMENT: [facility] Adult PT Treatment:                                                DATE: 02/08/2023 Therapeutic Exercise: Elliptical L5 R5 x 5 min while taking subjective Barbell box (18") squat 135# x 8, 155# 3 x 6 Rear foot elevated split squat with 20# bilat 3 x 5 each Lateral band walk with black at mid shin 3 x 20 down/back TRX SL squat with 8" knee tap 2 x 8 each Forward 6" heel tap 2 x 10 each Body weight squat with heels elevated to increase depth and isolate quads 2 x 10   [facility] Adult PT Treatment:                                                DATE: 01/25/2023 Therapeutic Exercise: Elliptical L5 R5 x 5 min while taking subjective Leg press (cybex) DL 474# x 10, 259# x 10, 563# 2 x 10; SL 100# x 8, 120# 2 x 8 (right 140# 3 x 8) Hex bar deadlift 155# x 6, 175# x 6, 185# x 6, 195# 2 x 6 SL squat to 20" box 3 x 5 knee extension machine SL 35# 3 x 6 on right (left 45# 3 x 8) Knee flexion machine SL 45# 3 x 8 each  [facility] Adult PT Treatment:                                                DATE: 01/11/2023 Therapeutic  Exercise Treadmill: 4.5 mph x 1 min x 1; 5.0 mph x  1 min x 3 DL broad jump 4 x 5 Forward and lateral DL hurdle jump 2 x 5 each Forward alternating SL hop 2 x 10 Forward SL hop 2 x 5 Rear foot elevated SL hop 3 x 5 each Barbell box (18") squat 135# x 8, 145# 2 x 6 Knee extension machine SL 35# 3 x 6 on right (left 45# 3 x 8) Knee flexion machine SL 45# 3 x 8 each  PATIENT EDUCATION:  Education details: HEP Person educated: Patient Education method: Explanation, Demonstration, Tactile cues, Verbal cues Education comprehension: verbalized understanding, returned demonstration, verbal cues required, tactile cues required, and needs further education   HOME EXERCISE PROGRAM: Access Code: [email or web address]     ASSESSMENT: CLINICAL IMPRESSION: Patient tolerated therapy well with no adverse effects. Therapy continues to focus primarily on strengthening for the right quad as she continues to exhibit strength deficit and impaired SL control with landing. She did reports some knee soreness from practicing jumping more and did report some low back pain with exercises this visit. She was instructed to limit her jumping progressions for 2x/week and strength 2-3x/week on separate days if possible, but if not to perform jumping first. Patient continues to require skilled physical therapy services to progress her strengthening and SL control, guide her through the jumping and hopping progression, and her running progression so she can return to her prior level of function without pain or limitation, and reduce the risk of future re-injury..    OBJECTIVE IMPAIRMENTS Abnormal gait, decreased activity tolerance, decreased balance, difficulty walking, decreased ROM, decreased strength, impaired flexibility, improper body mechanics, and pain.    ACTIVITY LIMITATIONS lifting, bending, standing, squatting, stairs, transfers, and locomotion level   PARTICIPATION LIMITATIONS: meal prep, cleaning, shopping, community  activity, and occupation   PERSONAL FACTORS Past/current experiences are also affecting patient's functional outcome.      GOALS: Goals reviewed with patient? Yes   SHORT TERM GOALS: Target date: 07/25/2022    Patient will be I with initial HEP in order to progress with therapy. Baseline: HEP provided at eval 07/27/2022: independent with initial current HEP Goal status: MET   2.  Patient will perform SLR x 10 repetitions without extension lag in order to improve quad control with ambulation Baseline: good quad activation, slight extension lag with SLR, fatigues with repetitions 07/03/2022: patient able to perform SLR x 10 without lag Goal status: MET   3.  Patient will demonstrate normalize gait mechanics without AD in order to improve walking ability Baseline: patient using bilateral crutches and knee immobilizer locked in extension 07/27/2022: patient ambulating without device Goal status: MET   4.  Patient will demonstrate right knee AROM 5-0-90 deg in order to  Baseline: right knee AROM 5-60 deg 07/27/2022: right knee AROM 5 - 0 - 117 deg Goal status: MET   5. Patient will report pain level </= 2/10 with walking or PT related activities in order to allow progress with therapy and reduce functional limitations.            Baseline: 4/10  07/27/2022: patient denies pain with exercises Goal Status: MET   LONG TERM GOALS: Target date: 03/22/2023   Patient will be I with final HEP to maintain progress from PT. Baseline: HEP provided at eval 07/27/2022: progressing toward final HEP 08/20/2022: progressing 09/28/2022: progressing plyometrics 10/13/2022: progressing 11/30/2022: progressing her strength, jumping, running exercises 01/25/2023: progressing her strength, jumping, running exercises Goal status: PARTIALLY MET   2.  Patient will report >/= 56/80  on LEFS in order to indicate improved functional ability without progression to running or jumping tasks Baseline:  17/80 07/27/2022: 43/80 08/20/2022: 54/80 10/13/2022: 60/80 Goal status: MET   3.  Patient will be able to demonstrate a squat without deviation or pain in order to progress strength and improve ability to perform household tasks Baseline: unable at eval 07/27/2022: patient performing wall squats, requires cues for even weight distribution and proper knee alignment 08/20/2022: progressing weighted squats 09/28/2022: able to perform bodyweight squat without deficit, progressing weighted squats 10/13/2022: able to perform squat without deviation Goal status: MET   4.  Patient will demonstrate full right knee AROM in order to improve functional mobility and reduce limitations with movement Baseline: knee AROM 5-60 deg at evaluation 07/27/2022: right knee AROM 5 - 0 - 117 deg 08/20/2022: patient demonstrates full AROM Goal status: MET  5. Patient will report >/= 75/80 on LEFS in order to indicate return to running and jumping tasks without limitation to return to prior level of function. Baseline: 60/80 11/30/2022: 64/80 12/07/2022: 65/80 01/25/2023: 76/80 Goal status:MET  6. Patient will demonstrate limb symmetry quad strength within 90% using HHD in order to indicate appropriate strength to return to prior level of activities.  Baseline: 61.4% 11/30/2022 85% 01/25/2023: 75.3%  Goal status: PARTIALLY MET  7. Patient will demonstrate limb symmetry hop series within 90% in order to indicate strength and control for patient to return to prior level of activities.  Baseline: patient exhibits limitations with hopping on right  11/30/2022: limitations with hopping on right 01/25/2023: patient continues to demonstrate limitations with hopping/jumping on the right compared to left  Goal status: PARTIALLY MET    PLAN: PT FREQUENCY: Every other week   PT DURATION: 8 weeks   PLANNED INTERVENTIONS: Therapeutic exercises, Therapeutic activity, Neuromuscular re-education, Balance training, Gait  training, Patient/Family education, Self Care, Joint mobilization, Joint manipulation, Aquatic Therapy, Dry Needling, Electrical stimulation, Cryotherapy, Moist heat, Taping, Vasopneumatic device, Manual therapy, and Re-evaluation   PLAN FOR NEXT SESSION: Review HEP and progress PRN, progressing quad and hip strengthening, single leg stability, plyometrics, jogging   Rosana Hoesampbell Kamori Barbier, PT, DPT, LAT, ATC 02/08/23  10:19 AM Phone: [phone] Fax: [phone]

## 2023-02-08 ENCOUNTER — Encounter: Payer: Self-pay | Admitting: Physical Therapy

## 2023-02-08 ENCOUNTER — Ambulatory Visit: Payer: Medicaid Other | Attending: Orthopaedic Surgery | Admitting: Physical Therapy

## 2023-02-08 ENCOUNTER — Other Ambulatory Visit: Payer: Self-pay

## 2023-02-08 DIAGNOSIS — R2689 Other abnormalities of gait and mobility: Secondary | ICD-10-CM | POA: Diagnosis present

## 2023-02-08 DIAGNOSIS — M25561 Pain in right knee: Secondary | ICD-10-CM | POA: Diagnosis not present

## 2023-02-08 DIAGNOSIS — M6281 Muscle weakness (generalized): Secondary | ICD-10-CM | POA: Insufficient documentation

## 2023-02-08 DIAGNOSIS — G8929 Other chronic pain: Secondary | ICD-10-CM | POA: Diagnosis not present

## 2023-02-12 DIAGNOSIS — S83511D Sprain of anterior cruciate ligament of right knee, subsequent encounter: Secondary | ICD-10-CM | POA: Diagnosis not present

## 2023-02-19 NOTE — Therapy (Signed)
OUTPATIENT PHYSICAL THERAPY TREATMENT NOTE   Patient Name: Cheryl Cole MRN: [id number] DOB:[birth year], 22 y.o., female 72 Date: 02/22/2023  PCP: Cleatrice Burke, MD REFERRING PROVIDER: Bjorn Pippin, MD   END OF SESSION:   PT End of Session - 02/22/23 0929     Visit Number 37    Number of Visits 39    Date for PT Re-Evaluation 03/22/23    Authorization Type [employer] / [employer]    Authorization Time Period 02/08/2023 - 04/02/2023    Authorization - Visit Number 2    Authorization - Number of Visits 2    PT Start Time 0928    PT Stop Time 1015    PT Time Calculation (min) 47 min    Activity Tolerance Patient tolerated treatment well    Behavior During Therapy [facility] for tasks assessed/performed                               Past Medical History:  Diagnosis Date   Torn meniscus    Past Surgical History:  Procedure Laterality Date   ANTERIOR CRUCIATE LIGAMENT REPAIR Right 06/25/2022   Procedure: RECONSTRUCTION ANTERIOR CRUCIATE LIGAMENT (ACL);  Surgeon: Bjorn Pippin, MD;  Location: [facility];  Service: Orthopedics;  Laterality: Right;   KNEE ARTHROSCOPY WITH ANTERIOR CRUCIATE LIGAMENT (ACL) REPAIR Left 02/05/2021   Procedure: KNEE ARTHROSCOPY WITH ANTERIOR CRUCIATE LIGAMENT (ACL) REPAIR WITH AUTOGRAFT;  Surgeon: Bjorn Pippin, MD;  Location: [facility];  Service: Orthopedics;  Laterality: Left;   KNEE ARTHROSCOPY WITH LATERAL MENISECTOMY  02/05/2021   Procedure: KNEE ARTHROSCOPY WITH LATERAL MENISECTOMY;  Surgeon: Bjorn Pippin, MD;  Location: [facility];  Service: Orthopedics;;   KNEE ARTHROSCOPY WITH MEDIAL MENISECTOMY Left 02/05/2021   Procedure: KNEE ARTHROSCOPY WITH  MEDIAL MENISCUS REPAIR;  Surgeon: Bjorn Pippin, MD;  Location: [facility];  Service: Orthopedics;  Laterality: Left;   KNEE ARTHROSCOPY WITH MEDIAL MENISECTOMY Right 06/25/2022   Procedure: KNEE ARTHROSCOPY WITH MEDIAL  MENISECTOMY;  Surgeon: Bjorn Pippin, MD;  Location:  SURGERY CENTER;  Service: Orthopedics;  Laterality: Right;   There are no problems to display for this patient.   REFERRING DIAG: Right knee arthroscopy with ACL reconstruction with BTB autograft and medical meniscus repair 8/24  THERAPY DIAG:  Chronic pain of right knee  Muscle weakness (generalized)  Other abnormalities of gait and mobility  Rationale for Evaluation and Treatment Rehabilitation  PERTINENT HISTORY: s/p right ACL reconstruction with BTB autograft, medial meniscectomy, lateral meniscus repair 06/25/2022  PRECAUTIONS: Knee   SUBJECTIVE: Patient reports she has been very busy so has not been able to do much strengthening or jogging since last visit.  PAIN:  Are you having pain? 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   OBJECTIVE: (objective measures completed at initial evaluation unless otherwise dated) PATIENT SURVEYS:  LEFS: 17/80  07/27/2022: 43/80  08/20/2022: 52/80 10/13/2022: 60/80 11/30/2022: 64/80 12/07/2022: 65/80 01/25/2023: 76/80 02/22/2023: 76/80  LOWER EXTREMITY ROM:   Active ROM Right eval Left eval Right 07/20/2022 Right 07/27/2022 Right 08/03/2022 Right 08/20/2022  Knee flexion 60 135 90 deg 117 123 135  Knee extension 5 lacking 8 hyper  5 hyper 5 hyper 5 hyper     PROM right knee 5-0-90 -  07/07/2022  LOWER EXTREMITY MMT:   Patient with good quad activation, slight extension lag with SLR  07/01/2022: patient able to perform SLR without lag   MMT Right eval Left eval Right 09/15/2022 Right 09/22/2022 Right 09/28/2022 Right 11/09/2022  Hip flexion 4- 4      Hip extension 4- Hip abduction 4- Knee flexion - Knee extension - HHD  Right 10/07/2022 Left 10/07/2022 Rt / Lt (avg) 11/30/2022 Rt / Lt (avg) 12/28/2022 Rt / Lt 01/25/2023 Rt / Lt 02/22/2023  Knee flexion 45.3, 45.2, 46.9 45.8 avg 60.6, 61, 54.2 58.6 avg   51 / 51.3   Knee extension 38.8, 42.1, 42.6 41.2 avg 65.3, 73.1, 63 67.1 avg 66 / 77 72.9 / 83.3 84.6 / 112.3 88.8 / 110.5    Quad limb symmetry:   10/07/2022: 61.4%  11/30/2022: 85%  12/28/2022: 87.5%  01/25/2023: 75.3%  02/22/2023: 80.4% Hamstring limb symmetry:  10/07/2022: 78.2%  01/25/2023: 99%  Hamstring/qaud ratio:  10/07/2022: 111%  FUNCTIONAL TESTS:  SL hop: patient exhibits stiff landing with decreased depth - 10/19/2022 11/30/2022: stiff landing with decreased depth, improved from last assessment but still with deficit compared to left  Jogging: stiff landing on right - 11/09/2022  Jump Testing: SL broad jump: 01/11/2023: left 38", right 21.5" (best of 3 trials)     TODAY'S TREATMENT: [facility] Adult PT Treatment:                                                DATE: 02/22/2023 Therapeutic Exercise: Treadmill: 4.0 mph x 1 min x 1; 5.0 mph x 1 min x 3 Goblet squat 5# with heels elevated to increase depth and isolate quads 2 x 10 DL hurdle jump 2 x 10 Skater jump 2 x 10 Drop jump 8" 2 x 5 SL rear foot elevated pogo jump 3 x 10 Barbell box (18") squat 135# x 8, 165# x 6, x 5, x 4 SL leg press (cybex) 120# 3 x 8, 140# x 5 on right (140# on left)   [facility] Adult PT Treatment:                                                DATE: 02/08/2023 Therapeutic Exercise: Elliptical L5 R5 x 5 min while taking subjective Barbell box (18") squat 135# x 8, 155# 3 x 6 Rear foot elevated split squat with 20# bilat 3 x 5 each Lateral band walk with black at mid shin 3 x 20 down/back TRX SL squat with 8" knee tap 2 x 8 each Forward 6" heel tap 2 x 10 each Body weight squat with heels elevated to increase depth and isolate quads 2 x 10  [facility] Adult PT Treatment:                                                DATE:  01/25/2023 Therapeutic Exercise: Elliptical L5 R5 x 5 min while taking subjective Leg press (  cybex) DL 161# x 10, 096# x 10, 045# 2 x 10; SL 100# x 8, 120# 2 x 8 (right 140# 3 x 8) Hex bar deadlift 155# x 6, 175# x 6, 185# x 6, 195# 2 x 6 SL squat to 20" box 3 x 5 knee extension machine SL 35# 3 x 6 on right (left 45# 3 x 8) Knee flexion machine SL 45# 3 x 8 each  [facility] Adult PT Treatment:                                                DATE: 01/11/2023 Therapeutic Exercise Treadmill: 4.5 mph x 1 min x 1; 5.0 mph x 1 min x 3 DL broad jump 4 x 5 Forward and lateral DL hurdle jump 2 x 5 each Forward alternating SL hop 2 x 10 Forward SL hop 2 x 5 Rear foot elevated SL hop 3 x 5 each Barbell box (18") squat 135# x 8, 145# 2 x 6 Knee extension machine SL 35# 3 x 6 on right (left 45# 3 x 8) Knee flexion machine SL 45# 3 x 8 each  PATIENT EDUCATION:  Education details: HEP Person educated: Patient Education method: Programmer, multimedia, Demonstration, Tactile cues, Verbal cues Education comprehension: verbalized understanding, returned demonstration, verbal cues required, tactile cues required, and needs further education   HOME EXERCISE PROGRAM: Access Code: [email or web address]     ASSESSMENT: CLINICAL IMPRESSION: Patient tolerated therapy well with no adverse effects. Therapy continued with running and jumping progression, and strengthening with increased for leg strengthening. She did report some low back pain with heavier squats this visit. She does continue to exhibit gradual progressing in right quad strength and is progressing well through her plyometrics in therapy. She does continue to exhibit limitations in quad control and strength deficit on the right side. She remains limited in return to sporting activities. She is approximately 8 months post-op her ACL reconstruction and is progressing as expected despite continue limitations in strength and jumping/running ability. She was encouraged in consistency  with gym strengthening and plyometric training to continue improvement. Patient continues to require skilled physical therapy services to progress her strengthening and SL control, guide her through the jumping and running progression so she can return to her prior level of function without pain or limitation, and reduce the risk of future re-injury.    OBJECTIVE IMPAIRMENTS Abnormal gait, decreased activity tolerance, decreased balance, difficulty walking, decreased ROM, decreased strength, impaired flexibility, improper body mechanics, and pain.    ACTIVITY LIMITATIONS lifting, bending, standing, squatting, stairs, transfers, and locomotion level   PARTICIPATION LIMITATIONS: meal prep, cleaning, shopping, community activity, and occupation   PERSONAL FACTORS Past/current experiences are also affecting patient's functional outcome.      GOALS: Goals reviewed with patient? Yes   SHORT TERM GOALS: Target date: 07/25/2022    Patient will be I with initial HEP in order to progress with therapy. Baseline: HEP provided at eval 07/27/2022: independent with initial current HEP Goal status: MET   2.  Patient will perform SLR x 10 repetitions without extension lag in order to improve quad control with ambulation Baseline: good quad activation, slight extension lag with SLR, fatigues with repetitions 07/03/2022: patient able to perform SLR x 10 without lag Goal status: MET   3.  Patient will demonstrate normalize gait mechanics without AD in order to improve  walking ability Baseline: patient using bilateral crutches and knee immobilizer locked in extension 07/27/2022: patient ambulating without device Goal status: MET   4.  Patient will demonstrate right knee AROM 5-0-90 deg in order to  Baseline: right knee AROM 5-60 deg 07/27/2022: right knee AROM 5 - 0 - 117 deg Goal status: MET   5. Patient will report pain level </= 2/10 with walking or PT related activities in order to allow progress with  therapy and reduce functional limitations.            Baseline: 4/10  07/27/2022: patient denies pain with exercises Goal Status: MET   LONG TERM GOALS: Target date: 03/22/2023   Patient will be I with final HEP to maintain progress from PT. Baseline: HEP provided at eval 07/27/2022: progressing toward final HEP 08/20/2022: progressing 09/28/2022: progressing plyometrics 10/13/2022: progressing 11/30/2022: progressing her strength, jumping, running exercises 01/25/2023: progressing her strength, jumping, running exercises 02/22/2023: progressing her strength, jumping, running exercises Goal status: PARTIALLY MET   2.  Patient will report >/= 56/80 on LEFS in order to indicate improved functional ability without progression to running or jumping tasks Baseline: 17/80 07/27/2022: 43/80 08/20/2022: 54/80 10/13/2022: 60/80 Goal status: MET   3.  Patient will be able to demonstrate a squat without deviation or pain in order to progress strength and improve ability to perform household tasks Baseline: unable at eval 07/27/2022: patient performing wall squats, requires cues for even weight distribution and proper knee alignment 08/20/2022: progressing weighted squats 09/28/2022: able to perform bodyweight squat without deficit, progressing weighted squats 10/13/2022: able to perform squat without deviation Goal status: MET   4.  Patient will demonstrate full right knee AROM in order to improve functional mobility and reduce limitations with movement Baseline: knee AROM 5-60 deg at evaluation 07/27/2022: right knee AROM 5 - 0 - 117 deg 08/20/2022: patient demonstrates full AROM Goal status: MET  5. Patient will report >/= 75/80 on LEFS in order to indicate return to running and jumping tasks without limitation to return to prior level of function. Baseline: 60/80 11/30/2022: 64/80 12/07/2022: 65/80 01/25/2023: 76/80 Goal status:MET  6. Patient will demonstrate limb symmetry quad strength  within 90% using HHD in order to indicate appropriate strength to return to prior level of activities.  Baseline: 61.4% 11/30/2022 85% 01/25/2023: 75.3% 02/22/2023: 80.4%  Goal status: PARTIALLY MET  7. Patient will demonstrate limb symmetry hop series within 90% in order to indicate strength and control for patient to return to prior level of activities.  Baseline: patient exhibits limitations with hopping on right  11/30/2022: limitations with hopping on right 01/25/2023: patient continues to demonstrate limitations with hopping/jumping on the right compared to left 02/22/2023: patient continues to demonstrate limitations with hopping/jumping on the right compared to left  Goal status: PARTIALLY MET    PLAN: PT FREQUENCY: Every other week   PT DURATION: 8 weeks   PLANNED INTERVENTIONS: Therapeutic exercises, Therapeutic activity, Neuromuscular re-education, Balance training, Gait training, Patient/Family education, Self Care, Joint mobilization, Joint manipulation, Aquatic Therapy, Dry Needling, Electrical stimulation, Cryotherapy, Moist heat, Taping, Vasopneumatic device, Manual therapy, and Re-evaluation   PLAN FOR NEXT SESSION: Review HEP and progress PRN, progressing quad and hip strengthening, single leg stability, plyometrics, jogging   Rosana Hoes, PT, DPT, LAT, ATC 02/22/23  1:19 PM Phone: [phone] Fax: [phone]

## 2023-02-22 ENCOUNTER — Encounter: Payer: Self-pay | Admitting: Physical Therapy

## 2023-02-22 ENCOUNTER — Ambulatory Visit: Payer: Medicaid Other | Admitting: Physical Therapy

## 2023-02-22 ENCOUNTER — Other Ambulatory Visit: Payer: Self-pay

## 2023-02-22 DIAGNOSIS — G8929 Other chronic pain: Secondary | ICD-10-CM

## 2023-02-22 DIAGNOSIS — R2689 Other abnormalities of gait and mobility: Secondary | ICD-10-CM

## 2023-02-22 DIAGNOSIS — M25561 Pain in right knee: Secondary | ICD-10-CM | POA: Diagnosis not present

## 2023-02-22 DIAGNOSIS — M6281 Muscle weakness (generalized): Secondary | ICD-10-CM

## 2023-02-26 DIAGNOSIS — S83511D Sprain of anterior cruciate ligament of right knee, subsequent encounter: Secondary | ICD-10-CM | POA: Diagnosis not present

## 2023-03-11 ENCOUNTER — Ambulatory Visit: Payer: 59 | Attending: Orthopaedic Surgery | Admitting: Physical Therapy

## 2023-03-11 ENCOUNTER — Encounter: Payer: Self-pay | Admitting: Physical Therapy

## 2023-03-11 ENCOUNTER — Other Ambulatory Visit: Payer: Self-pay

## 2023-03-11 DIAGNOSIS — M6281 Muscle weakness (generalized): Secondary | ICD-10-CM

## 2023-03-11 DIAGNOSIS — M25561 Pain in right knee: Secondary | ICD-10-CM | POA: Diagnosis not present

## 2023-03-11 DIAGNOSIS — R2689 Other abnormalities of gait and mobility: Secondary | ICD-10-CM | POA: Insufficient documentation

## 2023-03-11 DIAGNOSIS — G8929 Other chronic pain: Secondary | ICD-10-CM | POA: Diagnosis not present

## 2023-03-11 NOTE — Therapy (Signed)
OUTPATIENT PHYSICAL THERAPY TREATMENT NOTE   Patient Name: Cheryl Cole MRN: [id number] DOB:03-Dec-2000, 22 y.o., female Today's Date: 03/11/2023  PCP: Cleatrice Burke, MD REFERRING PROVIDER: Bjorn Pippin, MD   END OF SESSION:   PT End of Session - 03/11/23 0803     Visit Number 38    Number of Visits 39    Date for PT Re-Evaluation 03/22/23    Authorization Type [employer] / [employer]    Authorization Time Period 03/11/2023 - 04/24/2023    Authorization - Visit Number 1    Authorization - Number of Visits 4    PT Start Time 0800    PT Stop Time 0845    PT Time Calculation (min) 45 min    Activity Tolerance Patient tolerated treatment well    Behavior During Therapy [facility] for tasks assessed/performed                                Past Medical History:  Diagnosis Date   Torn meniscus    Past Surgical History:  Procedure Laterality Date   ANTERIOR CRUCIATE LIGAMENT REPAIR Right 06/25/2022   Procedure: RECONSTRUCTION ANTERIOR CRUCIATE LIGAMENT (ACL);  Surgeon: Bjorn Pippin, MD;  Location: [facility];  Service: Orthopedics;  Laterality: Right;   KNEE ARTHROSCOPY WITH ANTERIOR CRUCIATE LIGAMENT (ACL) REPAIR Left 02/05/2021   Procedure: KNEE ARTHROSCOPY WITH ANTERIOR CRUCIATE LIGAMENT (ACL) REPAIR WITH AUTOGRAFT;  Surgeon: Bjorn Pippin, MD;  Location: [facility];  Service: Orthopedics;  Laterality: Left;   KNEE ARTHROSCOPY WITH LATERAL MENISECTOMY  02/05/2021   Procedure: KNEE ARTHROSCOPY WITH LATERAL MENISECTOMY;  Surgeon: Bjorn Pippin, MD;  Location: [facility];  Service: Orthopedics;;   KNEE ARTHROSCOPY WITH MEDIAL MENISECTOMY Left 02/05/2021   Procedure: KNEE ARTHROSCOPY WITH  MEDIAL MENISCUS REPAIR;  Surgeon: Bjorn Pippin, MD;  Location: [facility];  Service: Orthopedics;  Laterality: Left;   KNEE ARTHROSCOPY WITH MEDIAL MENISECTOMY Right 06/25/2022   Procedure: KNEE ARTHROSCOPY WITH MEDIAL  MENISECTOMY;  Surgeon: Bjorn Pippin, MD;  Location: [facility];  Service: Orthopedics;  Laterality: Right;   There are no problems to display for this patient.   REFERRING DIAG: Right knee arthroscopy with ACL reconstruction with BTB autograft and medical meniscus repair 8/24  THERAPY DIAG:  Chronic pain of right knee  Muscle weakness (generalized)  Other abnormalities of gait and mobility  Rationale for Evaluation and Treatment Rehabilitation  PERTINENT HISTORY: s/p right ACL reconstruction with BTB autograft, medial meniscectomy, lateral meniscus repair 06/25/2022  PRECAUTIONS: Knee   SUBJECTIVE: Patient reports she has not been able to do as much for her knee but is going to have more time so will be able to focus more on strengthening.  PAIN:  Are you having pain? 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   OBJECTIVE: (objective measures completed at initial evaluation unless otherwise dated) PATIENT SURVEYS:  LEFS: 17/80  07/27/2022: 43/80  08/20/2022: 52/80 10/13/2022: 60/80 11/30/2022: 64/80 12/07/2022: 65/80 01/25/2023: 76/80 02/22/2023: 76/80  LOWER EXTREMITY ROM:   Active ROM Right eval Left eval Right 07/20/2022 Right 07/27/2022 Right 08/03/2022 Right 08/20/2022  Knee flexion 60 135 90 deg 117 123 135  Knee extension 5 lacking 8 hyper  5 hyper 5 hyper  5 hyper     PROM right knee 5-0-90 - 07/07/2022  LOWER EXTREMITY MMT:   Patient with good quad activation, slight extension lag with SLR  07/01/2022: patient able to perform SLR without lag   MMT Right eval Left eval Right 09/15/2022 Right 09/22/2022 Right 09/28/2022 Right 11/09/2022  Hip flexion 4- 4      Hip extension 4- 4 4  4    Hip abduction 4- 4 4  4    Knee flexion - 5 4  4 4   Knee extension -  5 4 4 4 4      HHD Right 10/07/2022 Left 10/07/2022 Rt / Lt (avg) 11/30/2022 Rt / Lt (avg) 12/28/2022 Rt / Lt 01/25/2023 Rt / Lt 02/22/2023  Knee flexion 45.3, 45.2, 46.9 45.8 avg 60.6, 61, 54.2 58.6 avg   51 / 51.3   Knee extension 38.8, 42.1, 42.6 41.2 avg 65.3, 73.1, 63 67.1 avg 66 / 77 72.9 / 83.3 84.6 / 112.3 88.8 / 110.5    Quad limb symmetry:   10/07/2022: 61.4%  11/30/2022: 85%  12/28/2022: 87.5%  01/25/2023: 75.3%  02/22/2023: 80.4% Hamstring limb symmetry:  10/07/2022: 78.2%  01/25/2023: 99%  Hamstring/qaud ratio:  10/07/2022: 111%  FUNCTIONAL TESTS:  SL hop: patient exhibits stiff landing with decreased depth - 10/19/2022 11/30/2022: stiff landing with decreased depth, improved from last assessment but still with deficit compared to left  Jogging: stiff landing on right - 11/09/2022  Jump Testing: SL broad jump: 01/11/2023: left 38", right 21.5" (best of 3 trials)     TODAY'S TREATMENT: [facility] Adult PT Treatment:                                                DATE: 03/11/2023 Therapeutic Exercise: Elliptical L5 R5 x 5 min while taking subjective Dynamic warm-up: fwd jog, lateral shuffle, high knees, butt kicks, high skip, karaoke Broad jump x 2 lengths SL alternating forward jump x 2 lengths Skater hop 2 x 10 Single leg board jump x 1 length each Single leg hop with rear foot elevated 2 x 20 each Goblet squat 5# with heels elevated to increase depth and isolate quads 2 x 10 DL forward and lateral hurdle jump 2 x 10 each Eccentric forward lunge with red power band 2 x 10 Lateral stepping against green power band x 2 lengths down/back   [facility] Adult PT Treatment:                                                DATE: 02/22/2023 Therapeutic Exercise: Treadmill: 4.0 mph x 1 min x 1; 5.0 mph x 1 min x 3 Goblet squat 5# with heels elevated to increase depth and isolate quads 2 x 10 DL hurdle jump 2 x 10 Skater jump 2 x 10 Drop jump 8" 2 x 5 SL rear foot elevated pogo jump 3 x  10 Barbell box (18") squat 135# x 8, 165# x 6, x 5, x 4 SL leg press (cybex) 120# 3 x 8, 140# x 5 on right (140# on left)  [facility] Adult PT Treatment:  DATE: 02/08/2023 Therapeutic Exercise: Elliptical L5 R5 x 5 min while taking subjective Barbell box (18") squat 135# x 8, 155# 3 x 6 Rear foot elevated split squat with 20# bilat 3 x 5 each Lateral band walk with black at mid shin 3 x 20 down/back TRX SL squat with 8" knee tap 2 x 8 each Forward 6" heel tap 2 x 10 each Body weight squat with heels elevated to increase depth and isolate quads 2 x 10  PATIENT EDUCATION:  Education details: HEP Person educated: Patient Education method: Programmer, multimedia, Demonstration, Actor cues, Verbal cues Education comprehension: verbalized understanding, returned demonstration, verbal cues required, tactile cues required, and needs further education   HOME EXERCISE PROGRAM: Access Code: [email or web address]     ASSESSMENT: CLINICAL IMPRESSION: Patient tolerated therapy well with no adverse effects. Therapy focused primarily on progressing plyometrics and improving her eccentric quad control. She is progressing with her plyometrics but does exhibit limitations with her eccentric control on the right with decreased depth and stiffer landings. No pain reported with therapy. Patient continues to require skilled physical therapy services to progress her strengthening and SL control, guide her through the jumping and running progression so she can return to her prior level of function without pain or limitation, and reduce the risk of future re-injury.    OBJECTIVE IMPAIRMENTS Abnormal gait, decreased activity tolerance, decreased balance, difficulty walking, decreased ROM, decreased strength, impaired flexibility, improper body mechanics, and pain.    ACTIVITY LIMITATIONS lifting, bending, standing, squatting, stairs, transfers, and locomotion level   PARTICIPATION LIMITATIONS:  meal prep, cleaning, shopping, community activity, and occupation   PERSONAL FACTORS Past/current experiences are also affecting patient's functional outcome.      GOALS: Goals reviewed with patient? Yes   SHORT TERM GOALS: Target date: 07/25/2022    Patient will be I with initial HEP in order to progress with therapy. Baseline: HEP provided at eval 07/27/2022: independent with initial current HEP Goal status: MET   2.  Patient will perform SLR x 10 repetitions without extension lag in order to improve quad control with ambulation Baseline: good quad activation, slight extension lag with SLR, fatigues with repetitions 07/03/2022: patient able to perform SLR x 10 without lag Goal status: MET   3.  Patient will demonstrate normalize gait mechanics without AD in order to improve walking ability Baseline: patient using bilateral crutches and knee immobilizer locked in extension 07/27/2022: patient ambulating without device Goal status: MET   4.  Patient will demonstrate right knee AROM 5-0-90 deg in order to  Baseline: right knee AROM 5-60 deg 07/27/2022: right knee AROM 5 - 0 - 117 deg Goal status: MET   5. Patient will report pain level </= 2/10 with walking or PT related activities in order to allow progress with therapy and reduce functional limitations.            Baseline: 4/10  07/27/2022: patient denies pain with exercises Goal Status: MET   LONG TERM GOALS: Target date: 03/22/2023   Patient will be I with final HEP to maintain progress from PT. Baseline: HEP provided at eval 07/27/2022: progressing toward final HEP 08/20/2022: progressing 09/28/2022: progressing plyometrics 10/13/2022: progressing 11/30/2022: progressing her strength, jumping, running exercises 01/25/2023: progressing her strength, jumping, running exercises 02/22/2023: progressing her strength, jumping, running exercises Goal status: PARTIALLY MET   2.  Patient will report >/= 56/80 on LEFS in order to  indicate improved functional ability without progression to running or jumping tasks Baseline: 17/80 07/27/2022: 43/80 08/20/2022:  54/80 10/13/2022: 60/80 Goal status: MET   3.  Patient will be able to demonstrate a squat without deviation or pain in order to progress strength and improve ability to perform household tasks Baseline: unable at eval 07/27/2022: patient performing wall squats, requires cues for even weight distribution and proper knee alignment 08/20/2022: progressing weighted squats 09/28/2022: able to perform bodyweight squat without deficit, progressing weighted squats 10/13/2022: able to perform squat without deviation Goal status: MET   4.  Patient will demonstrate full right knee AROM in order to improve functional mobility and reduce limitations with movement Baseline: knee AROM 5-60 deg at evaluation 07/27/2022: right knee AROM 5 - 0 - 117 deg 08/20/2022: patient demonstrates full AROM Goal status: MET  5. Patient will report >/= 75/80 on LEFS in order to indicate return to running and jumping tasks without limitation to return to prior level of function. Baseline: 60/80 11/30/2022: 64/80 12/07/2022: 65/80 01/25/2023: 76/80 Goal status:MET  6. Patient will demonstrate limb symmetry quad strength within 90% using HHD in order to indicate appropriate strength to return to prior level of activities.  Baseline: 61.4% 11/30/2022 85% 01/25/2023: 75.3% 02/22/2023: 80.4%  Goal status: PARTIALLY MET  7. Patient will demonstrate limb symmetry hop series within 90% in order to indicate strength and control for patient to return to prior level of activities.  Baseline: patient exhibits limitations with hopping on right  11/30/2022: limitations with hopping on right 01/25/2023: patient continues to demonstrate limitations with hopping/jumping on the right compared to left 02/22/2023: patient continues to demonstrate limitations with hopping/jumping on the right compared to  left  Goal status: PARTIALLY MET    PLAN: PT FREQUENCY: Every other week   PT DURATION: 8 weeks   PLANNED INTERVENTIONS: Therapeutic exercises, Therapeutic activity, Neuromuscular re-education, Balance training, Gait training, Patient/Family education, Self Care, Joint mobilization, Joint manipulation, Aquatic Therapy, Dry Needling, Electrical stimulation, Cryotherapy, Moist heat, Taping, Vasopneumatic device, Manual therapy, and Re-evaluation   PLAN FOR NEXT SESSION: Review HEP and progress PRN, progressing quad and hip strengthening, single leg stability, plyometrics, jogging   Rosana Hoes, PT, DPT, LAT, ATC 03/11/23  8:48 AM Phone: [phone] Fax: [phone]

## 2023-03-22 NOTE — Therapy (Signed)
OUTPATIENT PHYSICAL THERAPY TREATMENT NOTE   Patient Name: Cheryl Cole MRN: [id number] DOB:[birth year], 22 y.o., female 36 Date: 03/24/2023  PCP: Cleatrice Burke, MD REFERRING PROVIDER: Bjorn Pippin, MD   END OF SESSION:   PT End of Session - 03/24/23 0804     Visit Number 39    Number of Visits 41    Date for PT Re-Evaluation 04/21/23    Authorization Type [employer] / [employer]    Authorization Time Period 03/11/2023 - 04/24/2023    Authorization - Visit Number 2    Authorization - Number of Visits 4    PT Start Time 0800    PT Stop Time 0845    PT Time Calculation (min) 45 min    Activity Tolerance Patient tolerated treatment well    Behavior During Therapy [facility] for tasks assessed/performed                                 Past Medical History:  Diagnosis Date   Torn meniscus    Past Surgical History:  Procedure Laterality Date   ANTERIOR CRUCIATE LIGAMENT REPAIR Right 06/25/2022   Procedure: RECONSTRUCTION ANTERIOR CRUCIATE LIGAMENT (ACL);  Surgeon: Bjorn Pippin, MD;  Location: [facility];  Service: Orthopedics;  Laterality: Right;   KNEE ARTHROSCOPY WITH ANTERIOR CRUCIATE LIGAMENT (ACL) REPAIR Left 02/05/2021   Procedure: KNEE ARTHROSCOPY WITH ANTERIOR CRUCIATE LIGAMENT (ACL) REPAIR WITH AUTOGRAFT;  Surgeon: Bjorn Pippin, MD;  Location: [facility];  Service: Orthopedics;  Laterality: Left;   KNEE ARTHROSCOPY WITH LATERAL MENISECTOMY  02/05/2021   Procedure: KNEE ARTHROSCOPY WITH LATERAL MENISECTOMY;  Surgeon: Bjorn Pippin, MD;  Location: [facility];  Service: Orthopedics;;   KNEE ARTHROSCOPY WITH MEDIAL MENISECTOMY Left 02/05/2021   Procedure: KNEE ARTHROSCOPY WITH  MEDIAL MENISCUS REPAIR;  Surgeon: Bjorn Pippin, MD;  Location: [facility];  Service: Orthopedics;  Laterality: Left;   KNEE ARTHROSCOPY WITH MEDIAL MENISECTOMY Right 06/25/2022   Procedure: KNEE ARTHROSCOPY WITH MEDIAL  MENISECTOMY;  Surgeon: Bjorn Pippin, MD;  Location: [facility];  Service: Orthopedics;  Laterality: Right;   There are no problems to display for this patient.   REFERRING DIAG: Right knee arthroscopy with ACL reconstruction with BTB autograft and medical meniscus repair 8/24  THERAPY DIAG:  Chronic pain of right knee  Muscle weakness (generalized)  Other abnormalities of gait and mobility  Rationale for Evaluation and Treatment Rehabilitation  PERTINENT HISTORY: s/p right ACL reconstruction with BTB autograft, medial meniscectomy, lateral meniscus repair 06/25/2022  PRECAUTIONS: Knee   SUBJECTIVE: Patient reports she has been hiking and also has progressed with her knee strengthening in the gym. She states she has increased weight and resistance with her strengthening exercises. She has not done much running or jumping.  PAIN:  Are you having pain? 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   OBJECTIVE: (objective measures completed at initial evaluation unless otherwise dated) PATIENT SURVEYS:  LEFS: 17/80  07/27/2022: 43/80  08/20/2022: 52/80 10/13/2022: 60/80 11/30/2022: 64/80 12/07/2022: 65/80 01/25/2023: 76/80 02/22/2023: 76/80  LOWER EXTREMITY ROM:   Active ROM Right eval Left eval Right 07/20/2022 Right 07/27/2022 Right 08/03/2022 Right 08/20/2022  Knee flexion 60 135 90 deg 117 123 135  Knee extension 5  lacking 8 hyper  5 hyper 5 hyper 5 hyper     PROM right knee 5-0-90 - 07/07/2022  LOWER EXTREMITY MMT:   Patient with good quad activation, slight extension lag with SLR  07/01/2022: patient able to perform SLR without lag   MMT Right eval Left eval Right 09/15/2022 Right 09/22/2022 Right 09/28/2022 Right 11/09/2022  Hip flexion 4- 4      Hip extension  4- 4 4  4    Hip abduction 4- 4 4  4    Knee flexion - 5 4  4 4   Knee extension - 5 4 4 4 4      HHD Right 10/07/2022 Left 10/07/2022 Rt / Lt (avg) 11/30/2022 Rt / Lt (avg) 12/28/2022 Rt / Lt 01/25/2023 Rt / Lt 02/22/2023 Rt / Lt 03/24/2023  Knee flexion 45.3, 45.2, 46.9 45.8 avg 60.6, 61, 54.2 58.6 avg   51 / 51.3  53 / 56  Knee extension 38.8, 42.1, 42.6 41.2 avg 65.3, 73.1, 63 67.1 avg 66 / 77 72.9 / 83.3 84.6 / 112.3 88.8 / 110.5 101 / 108    Quad limb symmetry:   10/07/2022: 61.4%  11/30/2022: 85%  12/28/2022: 87.5%  01/25/2023: 75.3%  02/22/2023: 80.4%  03/24/2023: 92% Hamstring limb symmetry:  10/07/2022: 78.2%  01/25/2023: 99%  03/24/2023: 95%  Hamstring/qaud ratio:  10/07/2022: 111%  03/24/2023: 52%  FUNCTIONAL TESTS:  SL hop: patient exhibits stiff landing with decreased depth - 10/19/2022 11/30/2022: stiff landing with decreased depth, improved from last assessment but still with deficit compared to left  Jogging: stiff landing on right - 11/09/2022  Jump Testing: SL broad jump: 01/11/2023: left 38", right 21.5" (best of 3 trials)     TODAY'S TREATMENT: [facility] Adult PT Treatment:                                                DATE: 03/24/2023 Therapeutic Exercise: Elliptical L5 R5 x 5 min while taking subjective Barbell deadlift 95# 2 x 8, 115# 3 x 6, 125# x 6 Rear foot elevated split squat with 15# bilat SL leg press (cybex) 120# x 8, 140# 3 x 5 on right (160# on left) Eccentric SL leg press (cybex) 160# 2 x 5 on right Knee extension machine SL 45# 3 x 5 on right (55# on left) Knee flexion machine SL 45# 3 x 6 on right (55# on left) Therapeutic Activity: HHD strength assessment for goal reassessment   [facility] Adult PT Treatment:                                                DATE: 03/11/2023 Therapeutic Exercise: Elliptical L5 R5 x 5 min while taking subjective Dynamic warm-up: fwd jog, lateral shuffle, high knees, butt kicks, high skip, karaoke Broad jump x 2 lengths SL  alternating forward jump x 2 lengths Skater hop 2 x 10 Single leg board jump x 1 length each Single leg hop with rear foot elevated 2 x 20 each Goblet squat 5# with heels elevated to increase depth and isolate quads 2 x 10 DL forward and lateral hurdle jump 2 x 10 each Eccentric forward lunge with red power band 2 x 10 Lateral stepping against green power band x 2 lengths  down/back  [facility] Adult PT Treatment:                                                DATE: 02/22/2023 Therapeutic Exercise: Treadmill: 4.0 mph x 1 min x 1; 5.0 mph x 1 min x 3 Goblet squat 5# with heels elevated to increase depth and isolate quads 2 x 10 DL hurdle jump 2 x 10 Skater jump 2 x 10 Drop jump 8" 2 x 5 SL rear foot elevated pogo jump 3 x 10 Barbell box (18") squat 135# x 8, 165# x 6, x 5, x 4 SL leg press (cybex) 120# 3 x 8, 140# x 5 on right (140# on left)  [facility] Adult PT Treatment:                                                DATE: 02/08/2023 Therapeutic Exercise: Elliptical L5 R5 x 5 min while taking subjective Barbell box (18") squat 135# x 8, 155# 3 x 6 Rear foot elevated split squat with 20# bilat 3 x 5 each Lateral band walk with black at mid shin 3 x 20 down/back TRX SL squat with 8" knee tap 2 x 8 each Forward 6" heel tap 2 x 10 each Body weight squat with heels elevated to increase depth and isolate quads 2 x 10  PATIENT EDUCATION:  Education details: HEP Person educated: Patient Education method: Programmer, multimedia, Demonstration, Actor cues, Verbal cues Education comprehension: verbalized understanding, returned demonstration, verbal cues required, tactile cues required, and needs further education   HOME EXERCISE PROGRAM: Access Code: [email or web address]     ASSESSMENT: CLINICAL IMPRESSION: Patient tolerated therapy well with no adverse effects. She demonstrates improvement in her right leg strength and has achieved 90% limb symmetry with quad but overall does continue to exhibit gross strength deficit  and limitations with hopping and jumping. She also demonstrates continued hamstring strength deficit bilaterally. Therapy focused more on hamstring strengthening this visit  She was able to progress with weight in session with strengthening exercises and no knee pain reported. Patient continues to require skilled physical therapy services to progress her strengthening and SL control, guide her through the jumping and running progression so she can return to her prior level of function without pain or limitation, and reduce the risk of future re-injury, so will extend PT POC for 4 more weeks.    OBJECTIVE IMPAIRMENTS Abnormal gait, decreased activity tolerance, decreased balance, difficulty walking, decreased ROM, decreased strength, impaired flexibility, improper body mechanics, and pain.    ACTIVITY LIMITATIONS lifting, bending, standing, squatting, stairs, transfers, and locomotion level   PARTICIPATION LIMITATIONS: meal prep, cleaning, shopping, community activity, and occupation   PERSONAL FACTORS Past/current experiences are also affecting patient's functional outcome.      GOALS: Goals reviewed with patient? Yes   SHORT TERM GOALS: Target date: 07/25/2022    Patient will be I with initial HEP in order to progress with therapy. Baseline: HEP provided at eval 07/27/2022: independent with initial current HEP Goal status: MET   2.  Patient will perform SLR x 10 repetitions without extension lag in order to improve quad control with ambulation Baseline: good quad activation, slight extension lag with SLR, fatigues with repetitions 07/03/2022:  patient able to perform SLR x 10 without lag Goal status: MET   3.  Patient will demonstrate normalize gait mechanics without AD in order to improve walking ability Baseline: patient using bilateral crutches and knee immobilizer locked in extension 07/27/2022: patient ambulating without device Goal status: MET   4.  Patient will demonstrate right  knee AROM 5-0-90 deg in order to  Baseline: right knee AROM 5-60 deg 07/27/2022: right knee AROM 5 - 0 - 117 deg Goal status: MET   5. Patient will report pain level </= 2/10 with walking or PT related activities in order to allow progress with therapy and reduce functional limitations.            Baseline: 4/10  07/27/2022: patient denies pain with exercises Goal Status: MET   LONG TERM GOALS: Target date: 03/22/2023   Patient will be I with final HEP to maintain progress from PT. Baseline: HEP provided at eval 07/27/2022: progressing toward final HEP 08/20/2022: progressing 09/28/2022: progressing plyometrics 10/13/2022: progressing 11/30/2022: progressing her strength, jumping, running exercises 01/25/2023: progressing her strength, jumping, running exercises 02/22/2023: progressing her strength, jumping, running exercises 03/24/2023: progressing her strength, jumping, running exercises Goal status: PARTIALLY MET   2.  Patient will report >/= 56/80 on LEFS in order to indicate improved functional ability without progression to running or jumping tasks Baseline: 17/80 07/27/2022: 43/80 08/20/2022: 54/80 10/13/2022: 60/80 Goal status: MET   3.  Patient will be able to demonstrate a squat without deviation or pain in order to progress strength and improve ability to perform household tasks Baseline: unable at eval 07/27/2022: patient performing wall squats, requires cues for even weight distribution and proper knee alignment 08/20/2022: progressing weighted squats 09/28/2022: able to perform bodyweight squat without deficit, progressing weighted squats 10/13/2022: able to perform squat without deviation Goal status: MET   4.  Patient will demonstrate full right knee AROM in order to improve functional mobility and reduce limitations with movement Baseline: knee AROM 5-60 deg at evaluation 07/27/2022: right knee AROM 5 - 0 - 117 deg 08/20/2022: patient demonstrates full AROM Goal  status: MET  5. Patient will report >/= 75/80 on LEFS in order to indicate return to running and jumping tasks without limitation to return to prior level of function. Baseline: 60/80 11/30/2022: 64/80 12/07/2022: 65/80 01/25/2023: 76/80 Goal status:MET  6. Patient will demonstrate limb symmetry quad strength within 90% using HHD in order to indicate appropriate strength to return to prior level of activities.  Baseline: 61.4% 11/30/2022 85% 01/25/2023: 75.3% 02/22/2023: 80.4% 03/24/2023: 92%  Goal status: MET  7. Patient will demonstrate limb symmetry hop series within 90% in order to indicate strength and control for patient to return to prior level of activities.  Baseline: patient exhibits limitations with hopping on right  11/30/2022: limitations with hopping on right 01/25/2023: patient continues to demonstrate limitations with hopping/jumping on the right compared to left 02/22/2023: patient continues to demonstrate limitations with hopping/jumping on the right compared to left 03/24/2023: patient continues to demonstrate limitations with hopping/jumping on the right compared to left  Goal status: PARTIALLY MET    PLAN: PT FREQUENCY: Every other week   PT DURATION: 4 weeks   PLANNED INTERVENTIONS: Therapeutic exercises, Therapeutic activity, Neuromuscular re-education, Balance training, Gait training, Patient/Family education, Self Care, Joint mobilization, Joint manipulation, Aquatic Therapy, Dry Needling, Electrical stimulation, Cryotherapy, Moist heat, Taping, Vasopneumatic device, Manual therapy, and Re-evaluation   PLAN FOR NEXT SESSION: Review HEP and progress PRN, progressing quad and hip strengthening,  single leg stability, plyometrics, jogging   Rosana Hoes, PT, DPT, LAT, ATC 03/24/23  9:04 AM Phone: [phone] Fax: [phone]

## 2023-03-24 ENCOUNTER — Other Ambulatory Visit: Payer: Self-pay

## 2023-03-24 ENCOUNTER — Ambulatory Visit: Payer: 59 | Admitting: Physical Therapy

## 2023-03-24 ENCOUNTER — Encounter: Payer: Self-pay | Admitting: Physical Therapy

## 2023-03-24 DIAGNOSIS — G8929 Other chronic pain: Secondary | ICD-10-CM

## 2023-03-24 DIAGNOSIS — M25561 Pain in right knee: Secondary | ICD-10-CM | POA: Diagnosis not present

## 2023-03-24 DIAGNOSIS — R2689 Other abnormalities of gait and mobility: Secondary | ICD-10-CM

## 2023-03-24 DIAGNOSIS — M6281 Muscle weakness (generalized): Secondary | ICD-10-CM | POA: Diagnosis not present

## 2023-04-06 NOTE — Therapy (Signed)
OUTPATIENT PHYSICAL THERAPY TREATMENT NOTE   Patient Name: Cheryl Cole MRN: [id number] DOB:07-Oct-2001, 22 y.o., female Today's Date: 04/07/2023  PCP: Cleatrice Burke, MD REFERRING PROVIDER: Bjorn Pippin, MD   END OF SESSION:   PT End of Session - 04/07/23 0803     Visit Number 40    Number of Visits 41    Date for PT Re-Evaluation 04/21/23    Authorization Type [employer] / [employer]    Authorization Time Period 03/11/2023 - 04/24/2023    Authorization - Visit Number 3    Authorization - Number of Visits 4    PT Start Time 0800    PT Stop Time 0845    PT Time Calculation (min) 45 min    Activity Tolerance Patient tolerated treatment well    Behavior During Therapy [facility] for tasks assessed/performed                                  Past Medical History:  Diagnosis Date   Torn meniscus    Past Surgical History:  Procedure Laterality Date   ANTERIOR CRUCIATE LIGAMENT REPAIR Right 06/25/2022   Procedure: RECONSTRUCTION ANTERIOR CRUCIATE LIGAMENT (ACL);  Surgeon: Bjorn Pippin, MD;  Location: [facility];  Service: Orthopedics;  Laterality: Right;   KNEE ARTHROSCOPY WITH ANTERIOR CRUCIATE LIGAMENT (ACL) REPAIR Left 02/05/2021   Procedure: KNEE ARTHROSCOPY WITH ANTERIOR CRUCIATE LIGAMENT (ACL) REPAIR WITH AUTOGRAFT;  Surgeon: Bjorn Pippin, MD;  Location: [facility];  Service: Orthopedics;  Laterality: Left;   KNEE ARTHROSCOPY WITH LATERAL MENISECTOMY  02/05/2021   Procedure: KNEE ARTHROSCOPY WITH LATERAL MENISECTOMY;  Surgeon: Bjorn Pippin, MD;  Location: [facility];  Service: Orthopedics;;   KNEE ARTHROSCOPY WITH MEDIAL MENISECTOMY Left 02/05/2021   Procedure: KNEE ARTHROSCOPY WITH  MEDIAL MENISCUS REPAIR;  Surgeon: Bjorn Pippin, MD;  Location: [facility];  Service: Orthopedics;  Laterality: Left;   KNEE ARTHROSCOPY WITH MEDIAL MENISECTOMY Right 06/25/2022   Procedure: KNEE ARTHROSCOPY WITH MEDIAL  MENISECTOMY;  Surgeon: Bjorn Pippin, MD;  Location: [facility];  Service: Orthopedics;  Laterality: Right;   There are no problems to display for this patient.   REFERRING DIAG: Right knee arthroscopy with ACL reconstruction with BTB autograft and medical meniscus repair 8/24  THERAPY DIAG:  Chronic pain of right knee  Muscle weakness (generalized)  Other abnormalities of gait and mobility  Rationale for Evaluation and Treatment Rehabilitation  PERTINENT HISTORY: s/p right ACL reconstruction with BTB autograft, medial meniscectomy, lateral meniscus repair 06/25/2022  PRECAUTIONS: Knee   SUBJECTIVE: Patient reports she has still been doing her gym workouts and also more running and jumping. States that the jumping is limited more by mind than the actual task of jumping.  PAIN:  Are you having pain? 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   OBJECTIVE: (objective measures completed at initial evaluation unless otherwise dated) PATIENT SURVEYS:  LEFS: 17/80  07/27/2022: 43/80  08/20/2022: 52/80 10/13/2022: 60/80 11/30/2022: 64/80 12/07/2022: 65/80 01/25/2023: 76/80 02/22/2023: 76/80  LOWER EXTREMITY ROM:   Active ROM Right eval Left eval Right 07/20/2022 Right 07/27/2022 Right 08/03/2022 Right 08/20/2022  Knee flexion 60 135 90 deg 117 123 135  Knee extension 5 lacking 8 hyper  5  hyper 5 hyper 5 hyper     PROM right knee 5-0-90 - 07/07/2022  LOWER EXTREMITY MMT:   Patient with good quad activation, slight extension lag with SLR  07/01/2022: patient able to perform SLR without lag   MMT Right eval Left eval Right 09/15/2022 Right 09/22/2022 Right 09/28/2022 Right 11/09/2022  Hip flexion 4- 4      Hip extension 4- 4 4  4    Hip abduction 4- 4 4  4    Knee flexion  - 5 4  4 4   Knee extension - 5 4 4 4 4      HHD Right 10/07/2022 Left 10/07/2022 Rt / Lt (avg) 11/30/2022 Rt / Lt (avg) 12/28/2022 Rt / Lt 01/25/2023 Rt / Lt 02/22/2023 Rt / Lt 03/24/2023  Knee flexion 45.3, 45.2, 46.9 45.8 avg 60.6, 61, 54.2 58.6 avg   51 / 51.3  53 / 56  Knee extension 38.8, 42.1, 42.6 41.2 avg 65.3, 73.1, 63 67.1 avg 66 / 77 72.9 / 83.3 84.6 / 112.3 88.8 / 110.5 101 / 108    Quad limb symmetry:   10/07/2022: 61.4%  11/30/2022: 85%  12/28/2022: 87.5%  01/25/2023: 75.3%  02/22/2023: 80.4%  03/24/2023: 92% Hamstring limb symmetry:  10/07/2022: 78.2%  01/25/2023: 99%  03/24/2023: 95%  Hamstring/qaud ratio:  10/07/2022: 111%  03/24/2023: 52%  FUNCTIONAL TESTS:  SL hop: patient exhibits stiff landing with decreased depth - 10/19/2022 11/30/2022: stiff landing with decreased depth, improved from last assessment but still with deficit compared to left  Jogging: stiff landing on right - 11/09/2022  Jump Testing: SL broad jump: 01/11/2023: left 38", right 21.5" (best of 3 trials)     TODAY'S TREATMENT: [facility] Adult PT Treatment:                                                DATE: 04/06/2023 Therapeutic Exercise: Elliptical L5 R5 x 5 min while taking subjective Dynamic warm up consisting of jog, lateral shuffle, high knee, butt kick, karaoke, skip Forward run 75% x 4 lengths down/back, 90% x 3 lengths down/back Box jump 12" 2 x 5 Depth dump 12" 2 x 5 Rear foot elevated SL jump 2 x 5 Barbell box squat 135# x 8, 165# 3 x 6,  SL leg press (cybex) 140# 3 x 5 on right (160# on left) Eccentric SL leg press (cybex) 180# 2 x 5 on right Prone hamstring curl with FM 17# 3 x 10 Lateral band walk with black at ankles 3 x 25 down/back Nordic hamstring curls 3 x 3 Forward alternating lunge on BOSU 3 x 10   [facility] Adult PT Treatment:                                                DATE: 03/24/2023 Therapeutic Exercise: Elliptical L5 R5 x 5 min while taking subjective Barbell deadlift  95# 2 x 8, 115# 3 x 6, 125# x 6 Rear foot elevated split squat with 15# bilat SL leg press (cybex) 120# x 8, 140# 3 x 5 on right (160# on left) Eccentric SL leg press (cybex) 160# 2 x 5 on right Knee extension machine SL 45# 3 x 5 on right (55# on left) Knee flexion machine SL  45# 3 x 6 on right (55# on left) Therapeutic Activity: HHD strength assessment for goal reassessment  [facility] Adult PT Treatment:                                                DATE: 03/11/2023 Therapeutic Exercise: Elliptical L5 R5 x 5 min while taking subjective Dynamic warm-up: fwd jog, lateral shuffle, high knees, butt kicks, high skip, karaoke Broad jump x 2 lengths SL alternating forward jump x 2 lengths Skater hop 2 x 10 Single leg board jump x 1 length each Single leg hop with rear foot elevated 2 x 20 each Goblet squat 5# with heels elevated to increase depth and isolate quads 2 x 10 DL forward and lateral hurdle jump 2 x 10 each Eccentric forward lunge with red power band 2 x 10 Lateral stepping against green power band x 2 lengths down/back  [facility] Adult PT Treatment:                                                DATE: 02/22/2023 Therapeutic Exercise: Treadmill: 4.0 mph x 1 min x 1; 5.0 mph x 1 min x 3 Goblet squat 5# with heels elevated to increase depth and isolate quads 2 x 10 DL hurdle jump 2 x 10 Skater jump 2 x 10 Drop jump 8" 2 x 5 SL rear foot elevated pogo jump 3 x 10 Barbell box (18") squat 135# x 8, 165# x 6, x 5, x 4 SL leg press (cybex) 120# 3 x 8, 140# x 5 on right (140# on left)  PATIENT EDUCATION:  Education details: HEP Person educated: Patient Education method: Programmer, multimedia, Demonstration, Tactile cues, Verbal cues Education comprehension: verbalized understanding, returned demonstration, verbal cues required, tactile cues required, and needs further education   HOME EXERCISE PROGRAM: Access Code: [email or web address]     ASSESSMENT: CLINICAL IMPRESSION: Patient tolerated therapy well with  no adverse effects. Therapy focusing on progressing her running, jumping, and strengthening with good tolerance. She is progressing well with her strengthening and is tolerating increased resistance with all exercises and with proper technique and control. She does continue to exhibit limitations with her running and jumping with deficits noted on right with control with landing and this likely has a cognitive component with trust and fear using the right leg. Patient continues to require skilled physical therapy services to progress her strengthening and SL control, guide her through the jumping and running progression so she can return to her prior level of function without pain or limitation, and reduce the risk of future re-injury.    OBJECTIVE IMPAIRMENTS Abnormal gait, decreased activity tolerance, decreased balance, difficulty walking, decreased ROM, decreased strength, impaired flexibility, improper body mechanics, and pain.    ACTIVITY LIMITATIONS lifting, bending, standing, squatting, stairs, transfers, and locomotion level   PARTICIPATION LIMITATIONS: meal prep, cleaning, shopping, community activity, and occupation   PERSONAL FACTORS Past/current experiences are also affecting patient's functional outcome.      GOALS: Goals reviewed with patient? Yes   SHORT TERM GOALS: Target date: 07/25/2022    Patient will be I with initial HEP in order to progress with therapy. Baseline: HEP provided at eval 07/27/2022: independent with initial current HEP Goal status: MET  2.  Patient will perform SLR x 10 repetitions without extension lag in order to improve quad control with ambulation Baseline: good quad activation, slight extension lag with SLR, fatigues with repetitions 07/03/2022: patient able to perform SLR x 10 without lag Goal status: MET   3.  Patient will demonstrate normalize gait mechanics without AD in order to improve walking ability Baseline: patient using bilateral crutches  and knee immobilizer locked in extension 07/27/2022: patient ambulating without device Goal status: MET   4.  Patient will demonstrate right knee AROM 5-0-90 deg in order to  Baseline: right knee AROM 5-60 deg 07/27/2022: right knee AROM 5 - 0 - 117 deg Goal status: MET   5. Patient will report pain level </= 2/10 with walking or PT related activities in order to allow progress with therapy and reduce functional limitations.            Baseline: 4/10  07/27/2022: patient denies pain with exercises Goal Status: MET   LONG TERM GOALS: Target date: 04/21/2023   Patient will be I with final HEP to maintain progress from PT. Baseline: HEP provided at eval 07/27/2022: progressing toward final HEP 08/20/2022: progressing 09/28/2022: progressing plyometrics 10/13/2022: progressing 11/30/2022: progressing her strength, jumping, running exercises 01/25/2023: progressing her strength, jumping, running exercises 02/22/2023: progressing her strength, jumping, running exercises 03/24/2023: progressing her strength, jumping, running exercises Goal status: PARTIALLY MET   2.  Patient will report >/= 56/80 on LEFS in order to indicate improved functional ability without progression to running or jumping tasks Baseline: 17/80 07/27/2022: 43/80 08/20/2022: 54/80 10/13/2022: 60/80 Goal status: MET   3.  Patient will be able to demonstrate a squat without deviation or pain in order to progress strength and improve ability to perform household tasks Baseline: unable at eval 07/27/2022: patient performing wall squats, requires cues for even weight distribution and proper knee alignment 08/20/2022: progressing weighted squats 09/28/2022: able to perform bodyweight squat without deficit, progressing weighted squats 10/13/2022: able to perform squat without deviation Goal status: MET   4.  Patient will demonstrate full right knee AROM in order to improve functional mobility and reduce limitations with  movement Baseline: knee AROM 5-60 deg at evaluation 07/27/2022: right knee AROM 5 - 0 - 117 deg 08/20/2022: patient demonstrates full AROM Goal status: MET  5. Patient will report >/= 75/80 on LEFS in order to indicate return to running and jumping tasks without limitation to return to prior level of function. Baseline: 60/80 11/30/2022: 64/80 12/07/2022: 65/80 01/25/2023: 76/80 Goal status:MET  6. Patient will demonstrate limb symmetry quad strength within 90% using HHD in order to indicate appropriate strength to return to prior level of activities.  Baseline: 61.4% 11/30/2022 85% 01/25/2023: 75.3% 02/22/2023: 80.4% 03/24/2023: 92%  Goal status: MET  7. Patient will demonstrate limb symmetry hop series within 90% in order to indicate strength and control for patient to return to prior level of activities.  Baseline: patient exhibits limitations with hopping on right  11/30/2022: limitations with hopping on right 01/25/2023: patient continues to demonstrate limitations with hopping/jumping on the right compared to left 02/22/2023: patient continues to demonstrate limitations with hopping/jumping on the right compared to left 03/24/2023: patient continues to demonstrate limitations with hopping/jumping on the right compared to left  Goal status: PARTIALLY MET    PLAN: PT FREQUENCY: Every other week   PT DURATION: 4 weeks   PLANNED INTERVENTIONS: Therapeutic exercises, Therapeutic activity, Neuromuscular re-education, Balance training, Gait training, Patient/Family education, Self Care, Joint mobilization, Joint  manipulation, Aquatic Therapy, Dry Needling, Electrical stimulation, Cryotherapy, Moist heat, Taping, Vasopneumatic device, Manual therapy, and Re-evaluation   PLAN FOR NEXT SESSION: Review HEP and progress PRN, progressing quad and hip strengthening, single leg stability, plyometrics, jogging   Rosana Hoes, PT, DPT, LAT, ATC 04/07/23  8:59 AM Phone: [phone] Fax:  [phone]

## 2023-04-07 ENCOUNTER — Ambulatory Visit: Payer: 59 | Attending: Orthopaedic Surgery | Admitting: Physical Therapy

## 2023-04-07 ENCOUNTER — Encounter: Payer: Self-pay | Admitting: Physical Therapy

## 2023-04-07 ENCOUNTER — Other Ambulatory Visit: Payer: Self-pay

## 2023-04-07 DIAGNOSIS — R2689 Other abnormalities of gait and mobility: Secondary | ICD-10-CM | POA: Insufficient documentation

## 2023-04-07 DIAGNOSIS — M25561 Pain in right knee: Secondary | ICD-10-CM | POA: Insufficient documentation

## 2023-04-07 DIAGNOSIS — G8929 Other chronic pain: Secondary | ICD-10-CM | POA: Insufficient documentation

## 2023-04-07 DIAGNOSIS — M6281 Muscle weakness (generalized): Secondary | ICD-10-CM | POA: Insufficient documentation

## 2023-04-19 NOTE — Therapy (Signed)
OUTPATIENT PHYSICAL THERAPY TREATMENT NOTE  DISCHARGE   Patient Name: Cheryl Cole MRN: [id number] DOB:27-Aug-2001, 22 y.o., female Today's Date: 04/21/2023   PCP: Cleatrice Burke, MD REFERRING PROVIDER: Bjorn Pippin, MD   END OF SESSION:   PT End of Session - 04/21/23 0757     Visit Number 41    Number of Visits 41    Date for PT Re-Evaluation 04/21/23    Authorization Type [employer] / [employer]    Authorization Time Period 03/11/2023 - 04/24/2023    Authorization - Visit Number 4    Authorization - Number of Visits 4    PT Start Time 0800    PT Stop Time 0845    PT Time Calculation (min) 45 min    Activity Tolerance Patient tolerated treatment well    Behavior During Therapy [facility] for tasks assessed/performed                                   Past Medical History:  Diagnosis Date   Torn meniscus    Past Surgical History:  Procedure Laterality Date   ANTERIOR CRUCIATE LIGAMENT REPAIR Right 06/25/2022   Procedure: RECONSTRUCTION ANTERIOR CRUCIATE LIGAMENT (ACL);  Surgeon: Bjorn Pippin, MD;  Location: [facility];  Service: Orthopedics;  Laterality: Right;   KNEE ARTHROSCOPY WITH ANTERIOR CRUCIATE LIGAMENT (ACL) REPAIR Left 02/05/2021   Procedure: KNEE ARTHROSCOPY WITH ANTERIOR CRUCIATE LIGAMENT (ACL) REPAIR WITH AUTOGRAFT;  Surgeon: Bjorn Pippin, MD;  Location: [facility];  Service: Orthopedics;  Laterality: Left;   KNEE ARTHROSCOPY WITH LATERAL MENISECTOMY  02/05/2021   Procedure: KNEE ARTHROSCOPY WITH LATERAL MENISECTOMY;  Surgeon: Bjorn Pippin, MD;  Location: [facility];  Service: Orthopedics;;   KNEE ARTHROSCOPY WITH MEDIAL MENISECTOMY Left 02/05/2021   Procedure: KNEE ARTHROSCOPY WITH  MEDIAL MENISCUS REPAIR;  Surgeon: Bjorn Pippin, MD;  Location: [facility];  Service: Orthopedics;  Laterality: Left;   KNEE ARTHROSCOPY WITH MEDIAL MENISECTOMY Right 06/25/2022   Procedure: KNEE  ARTHROSCOPY WITH MEDIAL MENISECTOMY;  Surgeon: Bjorn Pippin, MD;  Location: [facility];  Service: Orthopedics;  Laterality: Right;   There are no problems to display for this patient.   REFERRING DIAG: Right knee arthroscopy with ACL reconstruction with BTB autograft and medical meniscus repair 8/24  THERAPY DIAG:  Chronic pain of right knee  Muscle weakness (generalized)  Other abnormalities of gait and mobility  Rationale for Evaluation and Treatment Rehabilitation  PERTINENT HISTORY: s/p right ACL reconstruction with BTB autograft, medial meniscectomy, lateral meniscus repair 06/25/2022  PRECAUTIONS: Knee   SUBJECTIVE: Patient reports she is doing well. She has been working out has been working on her jumping. She states she is a little fearful with her jumping but has been improved.   PAIN: Are you having pain? 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   OBJECTIVE: (objective measures completed at initial evaluation unless otherwise dated) PATIENT SURVEYS:  LEFS: 17/80  07/27/2022: 43/80  08/20/2022: 52/80 10/13/2022: 60/80 11/30/2022: 64/80 12/07/2022: 65/80 01/25/2023: 76/80 02/22/2023: 76/80 04/21/2023: 77/80  LOWER EXTREMITY ROM:   Active ROM Right eval Left eval Right 07/20/2022 Right 07/27/2022 Right 08/03/2022 Right 08/20/2022  Knee flexion 60 135 90 deg 117 123 135  Knee extension 5 lacking 8 hyper  5 hyper 5 hyper 5 hyper  PROM right knee 5-0-90 - 07/07/2022  LOWER EXTREMITY MMT:   Patient with good quad activation, slight extension lag with SLR  07/01/2022: patient able to perform SLR without lag   MMT Right eval Left eval Right 09/15/2022 Right 09/22/2022 Right 09/28/2022 Right 11/09/2022  Hip flexion 4- 4      Hip extension 4- 4 4  4    Hip abduction 4- 4 4  4    Knee flexion - 5 4  4 4   Knee extension - 5 4 4 4 4      HHD Right 10/07/2022  Left 10/07/2022 Rt / Lt (avg) 11/30/2022 Rt / Lt (avg) 12/28/2022 Rt / Lt 01/25/2023 Rt / Lt 02/22/2023 Rt / Lt 03/24/2023 Rt / Lt 04/21/2023:  Knee flexion 45.3, 45.2, 46.9 45.8 avg 60.6, 61, 54.2 58.6 avg   51 / 51.3  53 / 56 58 / 62  Knee extension 38.8, 42.1, 42.6 41.2 avg 65.3, 73.1, 63 67.1 avg 66 / 77 72.9 / 83.3 84.6 / 112.3 88.8 / 110.5 101 / 108 108 / 120    Quad limb symmetry:   10/07/2022: 61.4%  11/30/2022: 85%  12/28/2022: 87.5%  01/25/2023: 75.3%  02/22/2023: 80.4%  03/24/2023: 92%  04/21/2023: 90% Hamstring limb symmetry:  10/07/2022: 78.2%  01/25/2023: 99%  03/24/2023: 95%  04/21/2023: 94%  Hamstring/qaud ratio:  10/07/2022: 111%  03/24/2023: 52% 04/21/2023: 54%  FUNCTIONAL TESTS:  SL hop: patient exhibits stiff landing with decreased depth - 10/19/2022 11/30/2022: stiff landing with decreased depth, improved from last assessment but still with deficit compared to left  Jogging: stiff landing on right - 11/09/2022  Jump Testing: SL broad jump: 01/11/2023: left 38", right 21.5" (best of 3 trials) 04/21/2023: left: 38.75", right 37.5" (best of 3 trials) - 97% symmetry Triple hop:  04/21/2023: left: 119", right: 119" (best of 3 trials) - 100% symmetry     TODAY'S TREATMENT: [facility] Adult PT Treatment:                                                DATE: 04/21/2023 Therapeutic Exercise: Elliptical L5 R5 x 5 min while taking subjective Leg press (cybex):  DL 161# x 10, 096# x 8, 045# 2 x 6  SL 140# x 8, 150# x 8, 170# x 8 (left 160# x 8, 170# 2 x 8) Eccentric SL leg press (cybex) 200# 2 x 5 on right Therapeutic Activity: Strength and hopping assessment for goals   [facility] Adult PT Treatment:                                                DATE: 04/06/2023 Therapeutic Exercise: Elliptical L5 R5 x 5 min while taking subjective Dynamic warm up consisting of jog, lateral shuffle, high knee, butt kick, karaoke, skip Forward run 75% x 4 lengths down/back, 90% x 3 lengths  down/back Box jump 12" 2 x 5 Depth dump 12" 2 x 5 Rear foot elevated SL jump 2 x 5 Barbell box squat 135# x 8, 165# 3 x 6,  SL leg press (cybex) 140# 3 x 5 on right (160# on left) Eccentric SL leg press (cybex) 180# 2 x 5 on right Prone hamstring curl with FM 17# 3 x 10 Lateral  band walk with black at ankles 3 x 25 down/back Nordic hamstring curls 3 x 3 Forward alternating lunge on BOSU 3 x 10  [facility] Adult PT Treatment:                                                DATE: 03/24/2023 Therapeutic Exercise: Elliptical L5 R5 x 5 min while taking subjective Barbell deadlift 95# 2 x 8, 115# 3 x 6, 125# x 6 Rear foot elevated split squat with 15# bilat SL leg press (cybex) 120# x 8, 140# 3 x 5 on right (160# on left) Eccentric SL leg press (cybex) 160# 2 x 5 on right Knee extension machine SL 45# 3 x 5 on right (55# on left) Knee flexion machine SL 45# 3 x 6 on right (55# on left) Therapeutic Activity: HHD strength assessment for goal reassessment  PATIENT EDUCATION:  Education details: POC discharge, HEP Person educated: Patient Education method: Explanation Education comprehension: Verbalized understanding   HOME EXERCISE PROGRAM: Access Code: [email or web address]     ASSESSMENT: CLINICAL IMPRESSION: Patient tolerated therapy well with no adverse effects. She has made great progress in therapy and demonstrates strength of the right knee within 90% of the left. She does continue to exhibit limitations with her jumping on the right and this is likely due to fear. Overall she has made great progress and her knee does not limit her current functional ability and she is independent with her HEP and strengthening/plyometric/running program so she will be formally discharged from PT at this time.    OBJECTIVE IMPAIRMENTS Abnormal gait, decreased activity tolerance, decreased balance, difficulty walking, decreased ROM, decreased strength, impaired flexibility, improper body mechanics, and pain.     ACTIVITY LIMITATIONS lifting, bending, standing, squatting, stairs, transfers, and locomotion level   PARTICIPATION LIMITATIONS: meal prep, cleaning, shopping, community activity, and occupation   PERSONAL FACTORS Past/current experiences are also affecting patient's functional outcome.      GOALS: Goals reviewed with patient? Yes   SHORT TERM GOALS: Target date: 07/25/2022    Patient will be I with initial HEP in order to progress with therapy. Baseline: HEP provided at eval 07/27/2022: independent with initial current HEP Goal status: MET   2.  Patient will perform SLR x 10 repetitions without extension lag in order to improve quad control with ambulation Baseline: good quad activation, slight extension lag with SLR, fatigues with repetitions 07/03/2022: patient able to perform SLR x 10 without lag Goal status: MET   3.  Patient will demonstrate normalize gait mechanics without AD in order to improve walking ability Baseline: patient using bilateral crutches and knee immobilizer locked in extension 07/27/2022: patient ambulating without device Goal status: MET   4.  Patient will demonstrate right knee AROM 5-0-90 deg in order to  Baseline: right knee AROM 5-60 deg 07/27/2022: right knee AROM 5 - 0 - 117 deg Goal status: MET   5. Patient will report pain level </= 2/10 with walking or PT related activities in order to allow progress with therapy and reduce functional limitations.            Baseline: 4/10  07/27/2022: patient denies pain with exercises Goal Status: MET   LONG TERM GOALS: Target date: 04/21/2023   Patient will be I with final HEP to maintain progress from PT. Baseline: HEP provided at eval 07/27/2022: progressing toward  final HEP 08/20/2022: progressing 09/28/2022: progressing plyometrics 10/13/2022: progressing 11/30/2022: progressing her strength, jumping, running exercises 01/25/2023: progressing her strength, jumping, running exercises 02/22/2023: progressing  her strength, jumping, running exercises 03/24/2023: progressing her strength, jumping, running exercises 04/21/2023: independent Goal status: MET   2.  Patient will report >/= 56/80 on LEFS in order to indicate improved functional ability without progression to running or jumping tasks Baseline: 17/80 07/27/2022: 43/80 08/20/2022: 54/80 10/13/2022: 60/80 04/21/2023: 77/80 Goal status: MET   3.  Patient will be able to demonstrate a squat without deviation or pain in order to progress strength and improve ability to perform household tasks Baseline: unable at eval 07/27/2022: patient performing wall squats, requires cues for even weight distribution and proper knee alignment 08/20/2022: progressing weighted squats 09/28/2022: able to perform bodyweight squat without deficit, progressing weighted squats 10/13/2022: able to perform squat without deviation Goal status: MET   4.  Patient will demonstrate full right knee AROM in order to improve functional mobility and reduce limitations with movement Baseline: knee AROM 5-60 deg at evaluation 07/27/2022: right knee AROM 5 - 0 - 117 deg 08/20/2022: patient demonstrates full AROM Goal status: MET  5. Patient will report >/= 75/80 on LEFS in order to indicate return to running and jumping tasks without limitation to return to prior level of function. Baseline: 60/80 11/30/2022: 64/80 12/07/2022: 65/80 01/25/2023: 76/80 Goal status:MET  6. Patient will demonstrate limb symmetry quad strength within 90% using HHD in order to indicate appropriate strength to return to prior level of activities.  Baseline: 61.4% 11/30/2022 85% 01/25/2023: 75.3% 02/22/2023: 80.4% 03/24/2023: 92%  Goal status: MET  7. Patient will demonstrate limb symmetry hop series within 90% in order to indicate strength and control for patient to return to prior level of activities.  Baseline: patient exhibits limitations with hopping on right  11/30/2022: limitations with  hopping on right 01/25/2023: patient continues to demonstrate limitations with hopping/jumping on the right compared to left 02/22/2023: patient continues to demonstrate limitations with hopping/jumping on the right compared to left 03/24/2023: patient continues to demonstrate limitations with hopping/jumping on the right compared to left 04/21/2023: 97% with SL broad jump, 100% triple hop  Goal status: MET    PLAN: PT FREQUENCY: -   PT DURATION: -   PLANNED INTERVENTIONS: Therapeutic exercises, Therapeutic activity, Neuromuscular re-education, Balance training, Gait training, Patient/Family education, Self Care, Joint mobilization, Joint manipulation, Aquatic Therapy, Dry Needling, Electrical stimulation, Cryotherapy, Moist heat, Taping, Vasopneumatic device, Manual therapy, and Re-evaluation   PLAN FOR NEXT SESSION: NA - discharge   Rosana Hoes, PT, DPT, LAT, ATC 04/21/23  8:47 AM Phone: [phone] Fax: [phone]     PHYSICAL THERAPY DISCHARGE SUMMARY  Visits from Start of Care: 41  Current functional level related to goals / functional outcomes: See above   Remaining deficits: See above   Education / Equipment: HEP   Patient agrees to discharge. Patient goals were met. Patient is being discharged due to meeting the stated rehab goals.

## 2023-04-21 ENCOUNTER — Other Ambulatory Visit: Payer: Self-pay

## 2023-04-21 ENCOUNTER — Encounter: Payer: Self-pay | Admitting: Physical Therapy

## 2023-04-21 ENCOUNTER — Ambulatory Visit: Payer: 59 | Admitting: Physical Therapy

## 2023-04-21 DIAGNOSIS — R2689 Other abnormalities of gait and mobility: Secondary | ICD-10-CM

## 2023-04-21 DIAGNOSIS — G8929 Other chronic pain: Secondary | ICD-10-CM

## 2023-04-21 DIAGNOSIS — M6281 Muscle weakness (generalized): Secondary | ICD-10-CM

## 2023-04-21 DIAGNOSIS — M25561 Pain in right knee: Secondary | ICD-10-CM | POA: Diagnosis not present
